# Patient Record
Sex: Male | Born: 1965 | Race: White | Hispanic: No | Marital: Married | Smoking: Current every day smoker
Health system: Southern US, Community
[De-identification: ages and names within clinical notes are randomized; demographics above are authoritative.]

## PROBLEM LIST (undated history)

## (undated) DIAGNOSIS — E119 Type 2 diabetes mellitus without complications: Secondary | ICD-10-CM

## (undated) DIAGNOSIS — I10 Essential (primary) hypertension: Secondary | ICD-10-CM

## (undated) DIAGNOSIS — E785 Hyperlipidemia, unspecified: Secondary | ICD-10-CM

## (undated) HISTORY — PX: CHOLECYSTECTOMY: SHX55

## (undated) HISTORY — PX: HAND SURGERY: SHX662

## (undated) HISTORY — PX: TONSILLECTOMY: SUR1361

---

## 2003-07-05 ENCOUNTER — Encounter: Payer: Self-pay | Admitting: Emergency Medicine

## 2003-07-05 ENCOUNTER — Emergency Department (HOSPITAL_COMMUNITY): Admission: EM | Admit: 2003-07-05 | Discharge: 2003-07-05 | Payer: Self-pay | Admitting: Emergency Medicine

## 2014-04-23 DIAGNOSIS — G4733 Obstructive sleep apnea (adult) (pediatric): Secondary | ICD-10-CM | POA: Insufficient documentation

## 2014-08-08 ENCOUNTER — Encounter (HOSPITAL_COMMUNITY): Payer: Self-pay | Admitting: Emergency Medicine

## 2014-08-08 ENCOUNTER — Emergency Department (HOSPITAL_COMMUNITY)
Admission: EM | Admit: 2014-08-08 | Discharge: 2014-08-09 | Disposition: A | Discharge status: Home / Self Care | Payer: BC Managed Care – PPO | Attending: Emergency Medicine | Admitting: Emergency Medicine

## 2014-08-08 ENCOUNTER — Emergency Department (HOSPITAL_COMMUNITY): Payer: BC Managed Care – PPO

## 2014-08-08 DIAGNOSIS — F17200 Nicotine dependence, unspecified, uncomplicated: Secondary | ICD-10-CM | POA: Insufficient documentation

## 2014-08-08 DIAGNOSIS — R197 Diarrhea, unspecified: Secondary | ICD-10-CM | POA: Diagnosis not present

## 2014-08-08 DIAGNOSIS — Z79899 Other long term (current) drug therapy: Secondary | ICD-10-CM | POA: Insufficient documentation

## 2014-08-08 DIAGNOSIS — E119 Type 2 diabetes mellitus without complications: Secondary | ICD-10-CM | POA: Insufficient documentation

## 2014-08-08 DIAGNOSIS — R109 Unspecified abdominal pain: Secondary | ICD-10-CM

## 2014-08-08 DIAGNOSIS — R1032 Left lower quadrant pain: Secondary | ICD-10-CM | POA: Diagnosis present

## 2014-08-08 HISTORY — DX: Type 2 diabetes mellitus without complications: E11.9

## 2014-08-08 LAB — URINALYSIS, ROUTINE W REFLEX MICROSCOPIC
GLUCOSE, UA: NEGATIVE mg/dL
HGB URINE DIPSTICK: NEGATIVE
KETONES UR: NEGATIVE mg/dL
Leukocytes, UA: NEGATIVE
Nitrite: NEGATIVE
PH: 5.5 (ref 5.0–8.0)
Protein, ur: 30 mg/dL — AB
SPECIFIC GRAVITY, URINE: 1.029 (ref 1.005–1.030)
Urobilinogen, UA: 0.2 mg/dL (ref 0.0–1.0)

## 2014-08-08 LAB — CBC WITH DIFFERENTIAL/PLATELET
BASOS ABS: 0 10*3/uL (ref 0.0–0.1)
BASOS PCT: 0 % (ref 0–1)
EOS ABS: 0.5 10*3/uL (ref 0.0–0.7)
Eosinophils Relative: 3 % (ref 0–5)
HEMATOCRIT: 49 % (ref 39.0–52.0)
HEMOGLOBIN: 18 g/dL — AB (ref 13.0–17.0)
Lymphocytes Relative: 15 % (ref 12–46)
Lymphs Abs: 2.5 10*3/uL (ref 0.7–4.0)
MCH: 31.8 pg (ref 26.0–34.0)
MCHC: 36.7 g/dL — ABNORMAL HIGH (ref 30.0–36.0)
MCV: 86.6 fL (ref 78.0–100.0)
MONO ABS: 0.8 10*3/uL (ref 0.1–1.0)
MONOS PCT: 5 % (ref 3–12)
NEUTROS ABS: 12.4 10*3/uL — AB (ref 1.7–7.7)
Neutrophils Relative %: 77 % (ref 43–77)
PLATELETS: 235 10*3/uL (ref 150–400)
RBC: 5.66 MIL/uL (ref 4.22–5.81)
RDW: 12.3 % (ref 11.5–15.5)
WBC: 16.2 10*3/uL — ABNORMAL HIGH (ref 4.0–10.5)

## 2014-08-08 LAB — URINE MICROSCOPIC-ADD ON

## 2014-08-08 LAB — COMPREHENSIVE METABOLIC PANEL
ALBUMIN: 4.7 g/dL (ref 3.5–5.2)
ALK PHOS: 80 U/L (ref 39–117)
ALT: 69 U/L — AB (ref 0–53)
AST: 23 U/L (ref 0–37)
Anion gap: 18 — ABNORMAL HIGH (ref 5–15)
BUN: 14 mg/dL (ref 6–23)
CHLORIDE: 98 meq/L (ref 96–112)
CO2: 20 meq/L (ref 19–32)
Calcium: 9.9 mg/dL (ref 8.4–10.5)
Creatinine, Ser: 0.83 mg/dL (ref 0.50–1.35)
GFR calc Af Amer: >90 mL/min (ref >90)
GFR calc non Af Amer: >90 mL/min (ref >90)
Glucose, Bld: 186 mg/dL — ABNORMAL HIGH (ref 70–99)
Potassium: 4 mEq/L (ref 3.7–5.3)
SODIUM: 136 meq/L — AB (ref 137–147)
Total Bilirubin: 0.5 mg/dL (ref 0.3–1.2)
Total Protein: 8 g/dL (ref 6.0–8.3)

## 2014-08-08 LAB — LIPASE, BLOOD: Lipase: 28 U/L (ref 11–59)

## 2014-08-08 MED ORDER — ONDANSETRON HCL 4 MG/2ML IJ SOLN
4.0000 mg | Freq: Once | INTRAMUSCULAR | Status: AC
  Administered 2014-08-08: 4 mg via INTRAVENOUS
  Filled 2014-08-08: qty 2

## 2014-08-08 MED ORDER — HYDROMORPHONE HCL PF 1 MG/ML IJ SOLN
0.5000 mg | INTRAMUSCULAR | Status: DC | PRN
  Administered 2014-08-08 – 2014-08-09 (×2): 0.5 mg via INTRAVENOUS
  Filled 2014-08-08 (×2): qty 1

## 2014-08-08 MED ORDER — SODIUM CHLORIDE 0.9 % IV SOLN
1000.0000 mL | INTRAVENOUS | Status: DC
  Administered 2014-08-08: 1000 mL via INTRAVENOUS

## 2014-08-08 MED ORDER — SODIUM CHLORIDE 0.9 % IV SOLN
1000.0000 mL | Freq: Once | INTRAVENOUS | Status: AC
  Administered 2014-08-08: 1000 mL via INTRAVENOUS

## 2014-08-08 NOTE — ED Notes (Signed)
Pt reports having centralized abdominal pain that started at 0900 this morning. Pt reports nausea and diarrhea, however denies emesis. Pt also reports having an anterior headache that started at approximately 2130. Pt denies light sensitivity.

## 2014-08-08 NOTE — ED Provider Notes (Signed)
CSN: [id number]     Arrival date & time 08/08/14  2217 History   First MD Initiated Contact with Patient 08/08/14 2236     Chief Complaint  Patient presents with  . Abdominal Pain    Patient is a 48 y.o. male presenting with abdominal pain and headaches. The history is provided by the patient.  Abdominal Pain Pain location:  Suprapubic Pain quality: aching   Pain radiates to:  Does not radiate Pain severity:  Moderate Onset quality:  Gradual Duration:  13 hours Timing:  Constant Progression:  Worsening Context: not recent illness, not recent travel and not suspicious food intake   Ineffective treatments: otc meds, imodium, pepto bismol. Associated symptoms: diarrhea, nausea and vomiting   Associated symptoms: no constipation, no cough, no dysuria and no fever   Headache Associated symptoms: abdominal pain, diarrhea, nausea and vomiting   Associated symptoms: no cough and no fever     Past Medical History  Diagnosis Date  . Diabetes mellitus without complication    Past Surgical History  Procedure Laterality Date  . Cholecystectomy    . Tonsillectomy    . Hand surgery     No family history on file. History  Substance Use Topics  . Smoking status: Current Every Day Smoker -- 1.50 packs/day for 20 years    Types: Cigarettes  . Smokeless tobacco: Never Used  . Alcohol Use: Yes     Comment: Socially     Review of Systems  Constitutional: Negative for fever.  Respiratory: Negative for cough.   Gastrointestinal: Positive for nausea, vomiting, abdominal pain and diarrhea. Negative for constipation.  Genitourinary: Negative for dysuria.  Neurological: Positive for headaches.  All other systems reviewed and are negative.     Allergies  Review of patient's allergies indicates no known allergies.  Home Medications   Prior to Admission medications   Medication Sig Start Date End Date Taking? Authorizing Provider  atorvastatin (LIPITOR) 20 MG tablet Take 20 mg by  mouth daily.   Yes Historical Provider, MD  cetirizine (ZYRTEC) 10 MG tablet Take 10 mg by mouth daily.   Yes Historical Provider, MD  gabapentin (NEURONTIN) 300 MG capsule Take 600 mg by mouth at bedtime.   Yes Historical Provider, MD  lisinopril (PRINIVIL,ZESTRIL) 2.5 MG tablet Take 2.5 mg by mouth daily.   Yes Historical Provider, MD  metFORMIN (GLUCOPHAGE) 500 MG tablet Take 500 mg by mouth 2 (two) times daily with a meal.   Yes Historical Provider, MD  Multiple Vitamin (MULTIVITAMIN WITH MINERALS) TABS tablet Take 1 tablet by mouth daily.   Yes Historical Provider, MD  tadalafil (CIALIS) 5 MG tablet Take 5 mg by mouth daily as needed for erectile dysfunction.   Yes Historical Provider, MD   BP 149/88  Pulse 103  Temp(Src) 98 F (36.7 C) (Oral)  Resp 18  SpO2 91% Physical Exam  Nursing note and vitals reviewed. Constitutional: He appears well-developed and well-nourished. No distress.  HENT:  Head: Normocephalic and atraumatic.  Right Ear: External ear normal.  Left Ear: External ear normal.  Eyes: Conjunctivae are normal. Right eye exhibits no discharge. Left eye exhibits no discharge. No scleral icterus.  Neck: Neck supple. No tracheal deviation present.  Cardiovascular: Normal rate, regular rhythm and intact distal pulses.   Pulmonary/Chest: Effort normal and breath sounds normal. No stridor. No respiratory distress. He has no wheezes. He has no rales.  Abdominal: Soft. Bowel sounds are normal. He exhibits no distension. There is tenderness in the  left lower quadrant. There is no rigidity, no rebound and no guarding. No hernia.  Musculoskeletal: He exhibits no edema and no tenderness.  Neurological: He is alert. He has normal strength. No cranial nerve deficit (no facial droop, extraocular movements intact, no slurred speech) or sensory deficit. He exhibits normal muscle tone. He displays no seizure activity. Coordination normal.  Skin: Skin is warm and dry. No rash noted.   Psychiatric: He has a normal mood and affect.    ED Course  Procedures (including critical care time) Labs Review Labs Reviewed  CBC WITH DIFFERENTIAL - Abnormal; Notable for the following:    WBC 16.2 (*)    Hemoglobin 18.0 (*)    MCHC 36.7 (*)    Neutro Abs 12.4 (*)    All other components within normal limits  URINALYSIS, ROUTINE W REFLEX MICROSCOPIC - Abnormal; Notable for the following:    Color, Urine AMBER (*)    Bilirubin Urine SMALL (*)    Protein, ur 30 (*)    All other components within normal limits  URINE MICROSCOPIC-ADD ON - Abnormal; Notable for the following:    Casts GRANULAR CAST (*)    All other components within normal limits  COMPREHENSIVE METABOLIC PANEL  LIPASE, BLOOD      MDM   Exam is concerning for diverticulitis.  Will CT abdomen to evaluate further.  Dr Dina Rich will follow up on results.  Dorie Rank, MD 08/08/14 [phone]

## 2014-08-09 MED ORDER — IOHEXOL 300 MG/ML  SOLN
50.0000 mL | Freq: Once | INTRAMUSCULAR | Status: AC | PRN
  Administered 2014-08-09: 50 mL via ORAL

## 2014-08-09 MED ORDER — HYDROCODONE-ACETAMINOPHEN 5-325 MG PO TABS
1.0000 | ORAL_TABLET | Freq: Once | ORAL | Status: AC
  Administered 2014-08-09: 1 via ORAL
  Filled 2014-08-09: qty 1

## 2014-08-09 MED ORDER — HYDROCODONE-ACETAMINOPHEN 5-325 MG PO TABS: 1.0000 | ORAL_TABLET | Freq: Four times a day (QID) | ORAL | Status: DC | PRN

## 2014-08-09 MED ORDER — ONDANSETRON HCL 4 MG PO TABS: 4.0000 mg | ORAL_TABLET | Freq: Three times a day (TID) | ORAL | Status: DC | PRN

## 2014-08-09 MED ORDER — IOHEXOL 300 MG/ML  SOLN
100.0000 mL | Freq: Once | INTRAMUSCULAR | Status: AC | PRN
  Administered 2014-08-09: 100 mL via INTRAVENOUS

## 2014-08-09 MED ORDER — LOPERAMIDE HCL 2 MG PO CAPS: 2.0000 mg | ORAL_CAPSULE | Freq: Four times a day (QID) | ORAL | Status: DC | PRN

## 2014-08-09 NOTE — ED Notes (Signed)
Patient was given gingerale and graham crackers. Patient not having any problems with nausea at this time.

## 2014-08-09 NOTE — Discharge Instructions (Signed)
You were evaluated today for abdominal pain and diarrhea. Your CT scan is largely reassuring. Given your workup, suspect a viral gastroenteritis. He need continued supportive care at home. If your symptoms worsen, you develop fevers, or are not able to tolerate fluids he should be reevaluated.  Viral Gastroenteritis Viral gastroenteritis is also known as stomach flu. This condition affects the stomach and intestinal tract. It can cause sudden diarrhea and vomiting. The illness typically lasts 3 to 8 days. Most people develop an immune response that eventually gets rid of the virus. While this natural response develops, the virus can make you quite ill. CAUSES  Many different viruses can cause gastroenteritis, such as rotavirus or noroviruses. You can catch one of these viruses by consuming contaminated food or water. You may also catch a virus by sharing utensils or other personal items with an infected person or by touching a contaminated surface. SYMPTOMS  The most common symptoms are diarrhea and vomiting. These problems can cause a severe loss of body fluids (dehydration) and a body salt (electrolyte) imbalance. Other symptoms may include:  Fever.  Headache.  Fatigue.  Abdominal pain. DIAGNOSIS  Your caregiver can usually diagnose viral gastroenteritis based on your symptoms and a physical exam. A stool sample may also be taken to test for the presence of viruses or other infections. TREATMENT  This illness typically goes away on its own. Treatments are aimed at rehydration. The most serious cases of viral gastroenteritis involve vomiting so severely that you are not able to keep fluids down. In these cases, fluids must be given through an intravenous line (IV). HOME CARE INSTRUCTIONS   Drink enough fluids to keep your urine clear or pale yellow. Drink small amounts of fluids frequently and increase the amounts as tolerated.  Ask your caregiver for specific rehydration  instructions.  Avoid:  Foods high in sugar.  Alcohol.  Carbonated drinks.  Tobacco.  Juice.  Caffeine drinks.  Extremely hot or cold fluids.  Fatty, greasy foods.  Too much intake of anything at one time.  Dairy products until 24 to 48 hours after diarrhea stops.  You may consume probiotics. Probiotics are active cultures of beneficial bacteria. They may lessen the amount and number of diarrheal stools in adults. Probiotics can be found in yogurt with active cultures and in supplements.  Wash your hands well to avoid spreading the virus.  Only take over-the-counter or prescription medicines for pain, discomfort, or fever as directed by your caregiver. Do not give aspirin to children. Antidiarrheal medicines are not recommended.  Ask your caregiver if you should continue to take your regular prescribed and over-the-counter medicines.  Keep all follow-up appointments as directed by your caregiver. SEEK IMMEDIATE MEDICAL CARE IF:   You are unable to keep fluids down.  You do not urinate at least once every 6 to 8 hours.  You develop shortness of breath.  You notice blood in your stool or vomit. This may look like coffee grounds.  You have abdominal pain that increases or is concentrated in one small area (localized).  You have persistent vomiting or diarrhea.  You have a fever.  The patient is a child younger than 3 months, and he or she has a fever.  The patient is a child older than 3 months, and he or she has a fever and persistent symptoms.  The patient is a child older than 3 months, and he or she has a fever and symptoms suddenly get worse.  The patient is  a baby, and he or she has no tears when crying. MAKE SURE YOU:   Understand these instructions.  Will watch your condition.  Will get help right away if you are not doing well or get worse. Document Released: 11/13/2005 Document Revised: 02/05/2012 Document Reviewed: 08/30/2011 [facility] Patient  Information 2015 [place], [place]. This information is not intended to replace advice given to you by your health care provider. Make sure you discuss any questions you have with your health care provider.

## 2014-08-09 NOTE — ED Provider Notes (Signed)
SIgned out by Dr. Tomi Bamberger.  Pending CT.  CT without evidence of diverticulitis. Patient does have a distended stomach without outlet obstruction. On repeat exam, patient reports some improvement. Will transition to by mouth pain medication. Has been tolerating liquids but states his abdominal cramping gets worse with oral intake.  Given diarrhea, suspect possible gastroenteritis. Discussed with patient gentle rehydration and slow advancement of diet. He will be discharged with a short course of pain medications and is to continue Imodium.  After history, exam, and medical workup I feel the patient has been appropriately medically screened and is safe for discharge home. Pertinent diagnoses were discussed with the patient. Patient was given return precautions.   Results for orders placed during the hospital encounter of 08/08/14  CBC WITH DIFFERENTIAL      Result Value Ref Range   WBC 16.2 (*) 4.0 - 10.5 K/uL   RBC 5.66  4.22 - 5.81 MIL/uL   Hemoglobin 18.0 (*) 13.0 - 17.0 g/dL   HCT 49.0  39.0 - 52.0 %   MCV 86.6  78.0 - 100.0 fL   MCH 31.8  26.0 - 34.0 pg   MCHC 36.7 (*) 30.0 - 36.0 g/dL   RDW 12.3  11.5 - 15.5 %   Platelets 235  150 - 400 K/uL   Neutrophils Relative % 77  43 - 77 %   Neutro Abs 12.4 (*) 1.7 - 7.7 K/uL   Lymphocytes Relative 15  12 - 46 %   Lymphs Abs 2.5  0.7 - 4.0 K/uL   Monocytes Relative 5  3 - 12 %   Monocytes Absolute 0.8  0.1 - 1.0 K/uL   Eosinophils Relative 3  0 - 5 %   Eosinophils Absolute 0.5  0.0 - 0.7 K/uL   Basophils Relative 0  0 - 1 %   Basophils Absolute 0.0  0.0 - 0.1 K/uL  COMPREHENSIVE METABOLIC PANEL      Result Value Ref Range   Sodium 136 (*) 137 - 147 mEq/L   Potassium 4.0  3.7 - 5.3 mEq/L   Chloride 98  96 - 112 mEq/L   CO2 20  19 - 32 mEq/L   Glucose, Bld 186 (*) 70 - 99 mg/dL   BUN 14  6 - 23 mg/dL   Creatinine, Ser 0.83  0.50 - 1.35 mg/dL   Calcium 9.9  8.4 - 10.5 mg/dL   Total Protein 8.0  6.0 - 8.3 g/dL   Albumin 4.7  3.5 - 5.2  g/dL   AST 23  0 - 37 U/L   ALT 69 (*) 0 - 53 U/L   Alkaline Phosphatase 80  39 - 117 U/L   Total Bilirubin 0.5  0.3 - 1.2 mg/dL   GFR calc non Af Amer >90  >90 mL/min   GFR calc Af Amer >90  >90 mL/min   Anion gap 18 (*) 5 - 15  URINALYSIS, ROUTINE W REFLEX MICROSCOPIC      Result Value Ref Range   Color, Urine AMBER (*) YELLOW   APPearance CLEAR  CLEAR   Specific Gravity, Urine 1.029  1.005 - 1.030   pH 5.5  5.0 - 8.0   Glucose, UA NEGATIVE  NEGATIVE mg/dL   Hgb urine dipstick NEGATIVE  NEGATIVE   Bilirubin Urine SMALL (*) NEGATIVE   Ketones, ur NEGATIVE  NEGATIVE mg/dL   Protein, ur 30 (*) NEGATIVE mg/dL   Urobilinogen, UA 0.2  0.0 - 1.0 mg/dL   Nitrite NEGATIVE  NEGATIVE  Leukocytes, UA NEGATIVE  NEGATIVE  LIPASE, BLOOD      Result Value Ref Range   Lipase 28  11 - 59 U/L  URINE MICROSCOPIC-ADD ON      Result Value Ref Range   Squamous Epithelial / LPF RARE  RARE   WBC, UA 0-2  <3 WBC/hpf   RBC / HPF 0-2  <3 RBC/hpf   Bacteria, UA RARE  RARE   Casts GRANULAR CAST (*) NEGATIVE   Urine-Other MUCOUS PRESENT     Ct Abdomen Pelvis W Contrast  08/09/2014   CLINICAL DATA:  Suprapubic pain, nausea, vomiting and diarrhea.  EXAM: CT ABDOMEN AND PELVIS WITH CONTRAST  TECHNIQUE: Multidetector CT imaging of the abdomen and pelvis was performed using the standard protocol following bolus administration of intravenous contrast.  CONTRAST:  26mL OMNIPAQUE IOHEXOL 300 MG/ML SOLN, 112mL OMNIPAQUE IOHEXOL 300 MG/ML SOLN  COMPARISON:  None.  FINDINGS: The lung bases are clear except for dependent subpleural atelectasis. The heart is normal in size. No pericardial effusion. The distal esophagus is grossly normal.  CT abdomen cul  The liver is unremarkable except for mild fatty infiltration. No focal hepatic lesions or intrahepatic biliary dilatation. The gallbladder is surgically absent. Mild associated common bile duct dilatation. The pancreas is normal. The spleen is normal. The adrenal glands  and kidneys are normal.  The stomach is quite distended with contrast, fluid and food. The duodenum is unremarkable. Findings could be due to gastroparesis or bladder outlet obstruction although I do not see any evidence of such. The small bowel and colon are unremarkable. No inflammatory changes, mass lesions or obstructive findings. There is moderate diverticulosis of the sigmoid colon without findings for acute diverticulitis. The terminal ileum is normal. The appendix is not identified.  The aorta is normal in caliber. Minimal scattered atherosclerotic calcifications. No mesenteric or retroperitoneal mass or adenopathy. Small scattered lymph nodes are noted.  CT pelvis:  The bladder, prostate gland and seminal vesicles are unremarkable. No pelvic mass or adenopathy. No free pelvic fluid collections. No inguinal mass or adenopathy.  No significant bony findings.  IMPRESSION: 1. Markedly distended stomach without findings for gastric outlet obstruction. 2. Mild diffuse fatty infiltration of the liver. 3. No other significant abdominal/pelvic findings.   Electronically Signed   By: Kalman Jewels M.D.   On: 08/09/2014 01:20      Merryl Hacker, MD 08/09/14 [phone]

## 2015-05-26 DIAGNOSIS — K219 Gastro-esophageal reflux disease without esophagitis: Secondary | ICD-10-CM | POA: Insufficient documentation

## 2015-05-26 DIAGNOSIS — E785 Hyperlipidemia, unspecified: Secondary | ICD-10-CM | POA: Insufficient documentation

## 2015-05-26 DIAGNOSIS — I10 Essential (primary) hypertension: Secondary | ICD-10-CM | POA: Diagnosis present

## 2015-05-26 DIAGNOSIS — N521 Erectile dysfunction due to diseases classified elsewhere: Secondary | ICD-10-CM | POA: Insufficient documentation

## 2015-07-27 ENCOUNTER — Encounter: Payer: Self-pay | Admitting: *Deleted

## 2016-10-13 ENCOUNTER — Emergency Department (HOSPITAL_COMMUNITY)
Admission: EM | Admit: 2016-10-13 | Discharge: 2016-10-13 | Disposition: A | Discharge status: Home / Self Care | Payer: 59 | Attending: Physician Assistant | Admitting: Physician Assistant

## 2016-10-13 ENCOUNTER — Emergency Department (HOSPITAL_COMMUNITY): Payer: 59

## 2016-10-13 ENCOUNTER — Encounter (HOSPITAL_COMMUNITY): Payer: Self-pay | Admitting: Emergency Medicine

## 2016-10-13 DIAGNOSIS — Z79899 Other long term (current) drug therapy: Secondary | ICD-10-CM | POA: Diagnosis not present

## 2016-10-13 DIAGNOSIS — M62830 Muscle spasm of back: Secondary | ICD-10-CM | POA: Insufficient documentation

## 2016-10-13 DIAGNOSIS — W19XXXA Unspecified fall, initial encounter: Secondary | ICD-10-CM | POA: Diagnosis not present

## 2016-10-13 DIAGNOSIS — E119 Type 2 diabetes mellitus without complications: Secondary | ICD-10-CM | POA: Diagnosis not present

## 2016-10-13 DIAGNOSIS — F17210 Nicotine dependence, cigarettes, uncomplicated: Secondary | ICD-10-CM | POA: Diagnosis not present

## 2016-10-13 DIAGNOSIS — Y929 Unspecified place or not applicable: Secondary | ICD-10-CM | POA: Insufficient documentation

## 2016-10-13 DIAGNOSIS — Y939 Activity, unspecified: Secondary | ICD-10-CM | POA: Diagnosis not present

## 2016-10-13 DIAGNOSIS — Y999 Unspecified external cause status: Secondary | ICD-10-CM | POA: Diagnosis not present

## 2016-10-13 DIAGNOSIS — R0781 Pleurodynia: Secondary | ICD-10-CM | POA: Diagnosis present

## 2016-10-13 MED ORDER — OXYCODONE-ACETAMINOPHEN 5-325 MG PO TABS: 1.0000 | ORAL_TABLET | ORAL | 0 refills | Status: AC | PRN

## 2016-10-13 MED ORDER — TRIAMCINOLONE ACETONIDE 10 MG/ML IJ SUSP
10.0000 mg | Freq: Once | INTRAMUSCULAR | Status: DC
  Filled 2016-10-13: qty 1

## 2016-10-13 MED ORDER — BUPIVACAINE HCL 0.5 % IJ SOLN
50.0000 mL | Freq: Once | INTRAMUSCULAR | Status: AC
  Administered 2016-10-13: 50 mL
  Filled 2016-10-13: qty 50

## 2016-10-13 MED ORDER — MORPHINE SULFATE (PF) 4 MG/ML IV SOLN
4.0000 mg | Freq: Once | INTRAVENOUS | Status: DC
  Filled 2016-10-13: qty 1

## 2016-10-13 MED ORDER — METHOCARBAMOL 500 MG PO TABS
1000.0000 mg | ORAL_TABLET | Freq: Once | ORAL | Status: AC
  Administered 2016-10-13: 1000 mg via ORAL
  Filled 2016-10-13: qty 2

## 2016-10-13 MED ORDER — HYDROMORPHONE HCL 1 MG/ML IJ SOLN
0.5000 mg | Freq: Once | INTRAMUSCULAR | Status: AC
  Administered 2016-10-13: 0.5 mg via INTRAMUSCULAR
  Filled 2016-10-13: qty 1

## 2016-10-13 MED ORDER — MORPHINE SULFATE (PF) 4 MG/ML IV SOLN
4.0000 mg | Freq: Once | INTRAVENOUS | Status: AC
  Administered 2016-10-13: 4 mg via INTRAMUSCULAR
  Filled 2016-10-13: qty 1

## 2016-10-13 MED ORDER — TRIAMCINOLONE ACETONIDE 40 MG/ML IJ SUSP
10.0000 mg | Freq: Once | INTRAMUSCULAR | Status: AC
  Administered 2016-10-13: 10 mg via INTRAMUSCULAR
  Filled 2016-10-13: qty 0.25

## 2016-10-13 MED ORDER — METHOCARBAMOL 500 MG PO TABS: ORAL_TABLET | ORAL | 0 refills | Status: AC

## 2016-10-13 NOTE — ED Provider Notes (Signed)
[facility] DEPT Provider Note   CSN: [id number] Arrival date & time: 10/13/16  1216     History   Chief Complaint Chief Complaint  Patient presents with  . Ribcage Pain    HPI  Blood pressure 164/99, pulse 88, temperature 97.8 F (36.6 C), temperature source Oral, resp. rate 18, SpO2 97 %.  Scott Harris is a 50 y.o. male acute severe left posterior lateral rib pain onset this morning when patient was stretching while laying in bed at about 5:50 AM, he states that he heard 2 pops in pain became severe afterwards. Been taking ibuprofen at home with little relief. Pain began in the affected area after he had a mechanical fall down 2 steps onto a parked automobile approximately 5 days ago. He was seen at primary care and had a negative x-ray at that time, he was given a prescription for Vicodin but states it didn't help him so he threw them away. He denies history of DVT/PE, recent immobilizations, cough, shortness of breath. Pain is exacerbated by palpation and comes in waves. Has been diagnosed with hypertension, takes his blood pressure medications regularly, states that the blood pressure has been well controlled when checked by primary care.Patient does smoke cigarettes.  Past Medical History:  Diagnosis Date  . Diabetes mellitus without complication ([place])     There are no active problems to display for this patient.   Past Surgical History:  Procedure Laterality Date  . CHOLECYSTECTOMY    . HAND SURGERY    . TONSILLECTOMY         Home Medications    Prior to Admission medications   Medication Sig Start Date End Date Taking? Authorizing Provider  atorvastatin (LIPITOR) 10 MG tablet Take 5 mg by mouth daily.   Yes Historical Provider, MD  FLUoxetine (PROZAC) 20 MG capsule Take 1 capsule by mouth every morning. 09/04/16  Yes Historical Provider, MD  gabapentin (NEURONTIN) 300 MG capsule Take 600 mg by mouth at bedtime.   Yes Historical Provider, MD  ibuprofen  (ADVIL,MOTRIN) 200 MG tablet Take 400 mg by mouth every 4 (four) hours as needed for moderate pain.   Yes Historical Provider, MD  lansoprazole (PREVACID) 30 MG capsule Take 1 capsule by mouth every morning. 06/19/16  Yes Historical Provider, MD  lisinopril (PRINIVIL,ZESTRIL) 2.5 MG tablet Take 2.5 mg by mouth daily.   Yes Historical Provider, MD  SitaGLIPtin-MetFORMIN HCl 50-1000 MG TB24 Take 1 tablet by mouth every morning. 06/19/16  Yes Historical Provider, MD  methocarbamol (ROBAXIN) 500 MG tablet Can take up to 1-2 tabs every 6 hours PRN PAIN 10/13/16   Elmyra Ricks Sheffield Hawker, PA-C  oxyCODONE-acetaminophen (PERCOCET) 5-325 MG tablet Take 1 tablet by mouth every 4 (four) hours as needed. 10/13/16   Elmyra Ricks Lachandra Dettmann, PA-C    Family History No family history on file.  Social History Social History  Substance Use Topics  . Smoking status: Current Every Day Smoker    Packs/day: 1.50    Years: 20.00    Types: Cigarettes  . Smokeless tobacco: Never Used  . Alcohol use Yes     Comment: Socially      Allergies   Patient has no known allergies.   Review of Systems Review of Systems  10 systems reviewed and found to be negative, except as noted in the HPI.   Physical Exam Updated Vital Signs BP 156/84   Pulse 87   Temp 97.8 F (36.6 C) (Oral)   Resp 17   SpO2 96%  Physical Exam  Constitutional: He appears well-developed and well-nourished.  HENT:  Head: Normocephalic.  Eyes: Conjunctivae are normal.  Neck: Normal range of motion.  Cardiovascular: Normal rate, regular rhythm and intact distal pulses.   Pulmonary/Chest: Effort normal.     He exhibits tenderness.  Exquisite point tenderness to palpation as diagrammed, no underlying crepitance, lung sounds clear to auscultation no midline thoracic or lumbar tenderness.  Abdominal: Soft. There is no tenderness.  Neurological: He is alert.     Psychiatric: He has a normal mood and affect.  Nursing note and vitals  reviewed.    ED Treatments / Results  Labs (all labs ordered are listed, but only abnormal results are displayed) Labs Reviewed - No data to display  EKG  EKG Interpretation None       Radiology Dg Ribs Unilateral W/chest Left  Result Date: 10/13/2016 CLINICAL DATA:  [place] down steps 8 days ago.  Left chest pain EXAM: LEFT RIBS AND CHEST - 3+ VIEW COMPARISON:  None. FINDINGS: No fracture or other bone lesions are seen involving the ribs. There is no evidence of pneumothorax or pleural effusion. Both lungs are clear. Heart size and mediastinal contours are within normal limits. IMPRESSION: Negative. Electronically Signed   By: Franchot Gallo M.D.   On: 10/13/2016 15:43    Procedures Procedures (including critical care time)  Procedure Note: Trigger Point Injection for Myofascial pain  Performed by Calleigh Lafontant, PA-C  Indication: muscle/myofascial pain Muscle body and tendon sheath of the left latissimus dorsi  muscle(s) were injected with 0.5% bupivacaine with kenalog under sterile technique for release of muscle spasm/pain. Patient tolerated well with immediate improvement of symptoms and no immediate complications following procedure.  CPT Code:   1 or 2 muscle bodies: 20552 3 or more: 20553    Medications Ordered in ED Medications  morphine 4 MG/ML injection 4 mg (4 mg Intramuscular Given 10/13/16 1313)  bupivacaine (MARCAINE) 0.5 % (with pres) injection 50 mL (50 mLs Infiltration Given 10/13/16 1401)  methocarbamol (ROBAXIN) tablet 1,000 mg (1,000 mg Oral Given 10/13/16 1401)  triamcinolone acetonide (KENALOG-40) injection 10 mg (10 mg Intramuscular Given 10/13/16 1414)  HYDROmorphone (DILAUDID) injection 0.5 mg (0.5 mg Intramuscular Given 10/13/16 1639)     Initial Impression / Assessment and Plan / ED Course  I have reviewed the triage vital signs and the nursing notes.  Pertinent labs & imaging results that were available during my care of the patient  were reviewed by me and considered in my medical decision making (see chart for details).  Clinical Course     Vitals:   10/13/16 1224 10/13/16 1407 10/13/16 1603  BP: 164/99 (!) 185/114 156/84  Pulse: 88 84 87  Resp: 18 19 17   Temp: 97.8 F (36.6 C)    TempSrc: Oral    SpO2: 97% 98% 96%    Medications  morphine 4 MG/ML injection 4 mg (4 mg Intramuscular Given 10/13/16 1313)  bupivacaine (MARCAINE) 0.5 % (with pres) injection 50 mL (50 mLs Infiltration Given 10/13/16 1401)  methocarbamol (ROBAXIN) tablet 1,000 mg (1,000 mg Oral Given 10/13/16 1401)  triamcinolone acetonide (KENALOG-40) injection 10 mg (10 mg Intramuscular Given 10/13/16 1414)  HYDROmorphone (DILAUDID) injection 0.5 mg (0.5 mg Intramuscular Given 10/13/16 1639)    Scott Harris is 50 y.o. male presenting with severe posterior thoracic pain, exacerbated by movement and palpation, he had a minor mechanical fall 7 days ago, he's had pain in this area since then however it increased significantly this morning  when stretching. Lung sounds clear, x-ray without rib fracture. Based on the severity of his pain, would consider alternative diagnosis however given the exacerbation by movement and palpation it seems to be musculoskeletal, I doubt this is a dissection/PE/kidney stone.  This is a shared visit with the attending physician who personally evaluated the patient and agrees with the care plan.   Evaluation does not show pathology that would require ongoing emergent intervention or inpatient treatment. Pt is hemodynamically stable and mentating appropriately. Discussed findings and plan with patient/guardian, who agrees with care plan. All questions answered. Return precautions discussed and outpatient follow up given.      Final Clinical Impressions(s) / ED Diagnoses   Final diagnoses:  Muscle spasm of back    New Prescriptions New Prescriptions   METHOCARBAMOL (ROBAXIN) 500 MG TABLET    Can take up to 1-2 tabs every  6 hours PRN PAIN   OXYCODONE-ACETAMINOPHEN (PERCOCET) 5-325 MG TABLET    Take 1 tablet by mouth every 4 (four) hours as needed.     Monico Blitz, PA-C 10/13/16 [place], MD 10/13/16 2225

## 2016-10-13 NOTE — ED Triage Notes (Signed)
Pt reports fall last Thursday; DG and cleared but reports "stretched and heard two pops" this morning at 0550. Severe pain to left ribcage area since.

## 2016-10-13 NOTE — ED Notes (Signed)
Bed: WTR8 Expected date:  Expected time:  Means of arrival:  Comments: 

## 2016-10-13 NOTE — Discharge Instructions (Signed)
Please take ibuprofen 400mg  (this is normally 2 over the counter pills) every 6 hours (take with food to minimze stomach irritation).   Take robaxin and/or percocet for breakthrough pain, do not drink alcohol, drive, care for children or perfom other critical tasks while taking robaxin and/or percocet.  Please follow with your primary care doctor in the next 2 days for a check-up. They must obtain records for further management.   Do not hesitate to return to the Emergency Department for any new, worsening or concerning symptoms.

## 2016-10-16 DIAGNOSIS — E349 Endocrine disorder, unspecified: Secondary | ICD-10-CM | POA: Insufficient documentation

## 2016-10-20 ENCOUNTER — Emergency Department (HOSPITAL_COMMUNITY): Payer: 59

## 2016-10-20 ENCOUNTER — Emergency Department (EMERGENCY_DEPARTMENT_HOSPITAL)
Admission: EM | Admit: 2016-10-20 | Discharge: 2016-10-20 | Disposition: A | Discharge status: Home / Self Care | Payer: 59 | Source: Home / Self Care | Attending: Emergency Medicine | Admitting: Emergency Medicine

## 2016-10-20 ENCOUNTER — Encounter (HOSPITAL_COMMUNITY): Payer: Self-pay | Admitting: Emergency Medicine

## 2016-10-20 DIAGNOSIS — G939 Disorder of brain, unspecified: Secondary | ICD-10-CM

## 2016-10-20 DIAGNOSIS — F17210 Nicotine dependence, cigarettes, uncomplicated: Secondary | ICD-10-CM

## 2016-10-20 DIAGNOSIS — E119 Type 2 diabetes mellitus without complications: Secondary | ICD-10-CM

## 2016-10-20 DIAGNOSIS — I10 Essential (primary) hypertension: Secondary | ICD-10-CM

## 2016-10-20 DIAGNOSIS — C719 Malignant neoplasm of brain, unspecified: Secondary | ICD-10-CM | POA: Insufficient documentation

## 2016-10-20 DIAGNOSIS — Z7984 Long term (current) use of oral hypoglycemic drugs: Secondary | ICD-10-CM | POA: Insufficient documentation

## 2016-10-20 DIAGNOSIS — I63233 Cerebral infarction due to unspecified occlusion or stenosis of bilateral carotid arteries: Secondary | ICD-10-CM | POA: Diagnosis not present

## 2016-10-20 DIAGNOSIS — R410 Disorientation, unspecified: Secondary | ICD-10-CM

## 2016-10-20 DIAGNOSIS — R791 Abnormal coagulation profile: Secondary | ICD-10-CM

## 2016-10-20 DIAGNOSIS — Z79899 Other long term (current) drug therapy: Secondary | ICD-10-CM

## 2016-10-20 DIAGNOSIS — R4182 Altered mental status, unspecified: Secondary | ICD-10-CM | POA: Diagnosis not present

## 2016-10-20 DIAGNOSIS — D496 Neoplasm of unspecified behavior of brain: Secondary | ICD-10-CM

## 2016-10-20 HISTORY — DX: Hyperlipidemia, unspecified: E78.5

## 2016-10-20 HISTORY — DX: Essential (primary) hypertension: I10

## 2016-10-20 LAB — I-STAT CHEM 8, ED
BUN: 16 mg/dL (ref 6–20)
CHLORIDE: 101 mmol/L (ref 101–111)
Calcium, Ion: 1.22 mmol/L (ref 1.15–1.40)
Creatinine, Ser: 0.8 mg/dL (ref 0.61–1.24)
Glucose, Bld: 209 mg/dL — ABNORMAL HIGH (ref 65–99)
HCT: 48 % (ref 39.0–52.0)
HEMOGLOBIN: 16.3 g/dL (ref 13.0–17.0)
POTASSIUM: 4.6 mmol/L (ref 3.5–5.1)
SODIUM: 135 mmol/L (ref 135–145)
TCO2: 25 mmol/L (ref 0–100)

## 2016-10-20 LAB — RAPID URINE DRUG SCREEN, HOSP PERFORMED
Amphetamines: NOT DETECTED
BENZODIAZEPINES: NOT DETECTED
Barbiturates: NOT DETECTED
Cocaine: NOT DETECTED
OPIATES: NOT DETECTED
Tetrahydrocannabinol: NOT DETECTED

## 2016-10-20 LAB — COMPREHENSIVE METABOLIC PANEL
ALBUMIN: 4.9 g/dL (ref 3.5–5.0)
ALT: 97 U/L — ABNORMAL HIGH (ref 17–63)
ANION GAP: 10 (ref 5–15)
AST: 42 U/L — ABNORMAL HIGH (ref 15–41)
Alkaline Phosphatase: 78 U/L (ref 38–126)
BUN: 14 mg/dL (ref 6–20)
CHLORIDE: 101 mmol/L (ref 101–111)
CO2: 23 mmol/L (ref 22–32)
Calcium: 9.7 mg/dL (ref 8.9–10.3)
Creatinine, Ser: 0.81 mg/dL (ref 0.61–1.24)
GFR calc non Af Amer: >60 mL/min (ref >60)
GLUCOSE: 214 mg/dL — AB (ref 65–99)
POTASSIUM: 4.4 mmol/L (ref 3.5–5.1)
SODIUM: 134 mmol/L — AB (ref 135–145)
Total Bilirubin: 0.7 mg/dL (ref 0.3–1.2)
Total Protein: 8.1 g/dL (ref 6.5–8.1)

## 2016-10-20 LAB — DIFFERENTIAL
BASOS PCT: 0 %
Basophils Absolute: 0 10*3/uL (ref 0.0–0.1)
EOS ABS: 0.1 10*3/uL (ref 0.0–0.7)
EOS PCT: 1 %
LYMPHS PCT: 25 %
Lymphs Abs: 1.9 10*3/uL (ref 0.7–4.0)
Monocytes Absolute: 0.7 10*3/uL (ref 0.1–1.0)
Monocytes Relative: 10 %
NEUTROS PCT: 64 %
Neutro Abs: 5 10*3/uL (ref 1.7–7.7)

## 2016-10-20 LAB — URINALYSIS, ROUTINE W REFLEX MICROSCOPIC
Bilirubin Urine: NEGATIVE
Glucose, UA: 100 mg/dL — AB
HGB URINE DIPSTICK: NEGATIVE
Ketones, ur: NEGATIVE mg/dL
Leukocytes, UA: NEGATIVE
NITRITE: NEGATIVE
PH: 5.5 (ref 5.0–8.0)
Protein, ur: NEGATIVE mg/dL
SPECIFIC GRAVITY, URINE: 1.007 (ref 1.005–1.030)

## 2016-10-20 LAB — ETHANOL

## 2016-10-20 LAB — CBC
HCT: 45.6 % (ref 39.0–52.0)
Hemoglobin: 16.5 g/dL (ref 13.0–17.0)
MCH: 31.6 pg (ref 26.0–34.0)
MCHC: 36.2 g/dL — AB (ref 30.0–36.0)
MCV: 87.4 fL (ref 78.0–100.0)
PLATELETS: 264 10*3/uL (ref 150–400)
RBC: 5.22 MIL/uL (ref 4.22–5.81)
RDW: 12.4 % (ref 11.5–15.5)
WBC: 7.7 10*3/uL (ref 4.0–10.5)

## 2016-10-20 LAB — PROTIME-INR
INR: 0.89
PROTHROMBIN TIME: 12 s (ref 11.4–15.2)

## 2016-10-20 LAB — I-STAT TROPONIN, ED: Troponin i, poc: 0 ng/mL (ref 0.00–0.08)

## 2016-10-20 LAB — CBG MONITORING, ED: GLUCOSE-CAPILLARY: 185 mg/dL — AB (ref 65–99)

## 2016-10-20 LAB — APTT: aPTT: 26 seconds (ref 24–36)

## 2016-10-20 MED ORDER — PANTOPRAZOLE SODIUM 20 MG PO TBEC
20.0000 mg | DELAYED_RELEASE_TABLET | Freq: Once | ORAL | Status: AC
  Administered 2016-10-20: 20 mg via ORAL
  Filled 2016-10-20: qty 1

## 2016-10-20 MED ORDER — LEVETIRACETAM 500 MG PO TABS
500.0000 mg | ORAL_TABLET | Freq: Two times a day (BID) | ORAL | 0 refills | Status: AC
Start: 2016-10-20 — End: ?

## 2016-10-20 MED ORDER — GADOBENATE DIMEGLUMINE 529 MG/ML IV SOLN
20.0000 mL | Freq: Once | INTRAVENOUS | Status: AC | PRN
  Administered 2016-10-20: 19 mL via INTRAVENOUS

## 2016-10-20 MED ORDER — OMEPRAZOLE 20 MG PO CPDR: 20.0000 mg | DELAYED_RELEASE_CAPSULE | Freq: Every day | ORAL | 1 refills | Status: AC

## 2016-10-20 MED ORDER — DEXAMETHASONE 4 MG PO TABS
8.0000 mg | ORAL_TABLET | Freq: Once | ORAL | Status: AC
  Administered 2016-10-20: 8 mg via ORAL
  Filled 2016-10-20: qty 2

## 2016-10-20 MED ORDER — DEXAMETHASONE 4 MG PO TABS: 8.0000 mg | ORAL_TABLET | Freq: Two times a day (BID) | ORAL | 1 refills | Status: AC

## 2016-10-20 MED ORDER — LEVETIRACETAM 500 MG PO TABS
500.0000 mg | ORAL_TABLET | Freq: Once | ORAL | Status: AC
  Administered 2016-10-20: 500 mg via ORAL
  Filled 2016-10-20: qty 1

## 2016-10-20 NOTE — ED Provider Notes (Addendum)
Consult: [id number]) reviewed with Dr. Saintclair Halsted. He will assess the patient in the emergency department. Requests consultation to neurology.  Consult: (18:30(reviewed with Dr. Lawana Pai, neurology. He reports that at this time he cannot leave [facility] due to no additional coverage for code strokes. Requested the patient be transferred to [facility] for neurology consultation.  I have reviewed the patient's results with them and repeated examination. The patient is alert and oriented. He can perform finger-nose examination bilaterally. He can perform heel shin examination bilaterally. Extraocular motions are symmetric. He has no respiratory distress. I have reviewed the history present illness with the patient and his wife. They're updated on plans for consultation.   Charlesetta Shanks, MD 10/20/16 1834  Patient has been seen by both Dr. Saintclair Halsted and Dr. Leonel Ramsay. Follow-up arrangements have been made. Dr. Leonel Ramsay advises for prescriptions of Keppra 500 twice daily. Decadron 8 mg every 12 hours. And omeprazole.   Charlesetta Shanks, MD 10/20/16 2108

## 2016-10-20 NOTE — Consult Note (Signed)
Neurology Consultation Reason for Consult: Brain Lesion Referring Physician: Calla Kicks  CC: Confusion  History is obtained from:patient  HPI: Shotaro Lewy is a 50 y.o. male who presented due to confusion. His wife states that yesterday, he was quite confused, not talking very much and saying things that didn't quite make sense. Today, he is slightly better but still not his normal self. He also reports intermittent paresthesias predominantly of the left arm which are transient. He states that they will start distally and then worked her way up his arm last for approximately 1-2 minutes. He has not noticed any tensing of the arm or twitching with these. He has not noticed any confusion with these. He denies any frank seizure activity. He has noticed these for about the past 2 weeks. Over this time, he has also noticed increasing number of headaches.  An MRI was performed which shows a lesion in the right frontal lobe which is restricted diffusion. Neurosurgery was consulted and recommended neurology consultation as well.    ROS: A 14 point ROS was performed and is negative except as noted in the HPI.   Past Medical History:  Diagnosis Date  . Diabetes mellitus without complication ([place])   . Hyperlipemia   . Hypertension      Family history: No history of similar   Social History:  reports that he has been smoking Cigarettes.  He has a 30.00 pack-year smoking history. He has never used smokeless tobacco. He reports that he drinks alcohol. He reports that he does not use drugs.   Exam: Current vital signs: BP 166/99 (BP Location: Left Arm)   Pulse 82   Temp 97.6 F (36.4 C) (Oral)   Resp 17   Ht 5\' 9"  (1.753 m)   Wt 90.7 kg (200 lb)   SpO2 96%   BMI 29.53 kg/m  Vital signs in last 24 hours: Temp:  [97.6 F (36.4 C)-98.5 F (36.9 C)] 97.6 F (36.4 C) (11/24 2125) Pulse Rate:  [80-86] 82 (11/24 2125) Resp:  [12-23] 17 (11/24 2125) BP: (165-192)/(99-138) 166/99 (11/24  2125) SpO2:  [94 %-100 %] 96 % (11/24 2125) Weight:  [90.7 kg (200 lb)] 90.7 kg (200 lb) (11/24 1938)   Physical Exam  Constitutional: Appears well-developed and well-nourished.  Psych: Affect appropriate to situation Eyes: No scleral injection HENT: No OP obstrucion Head: Normocephalic.  Cardiovascular: Normal rate and regular rhythm.  Respiratory: Effort normal and breath sounds normal to anterior ascultation GI: Soft.  No distension. There is no tenderness.  Skin: WDI  Neuro: Mental Status: Patient is awake, alert, oriented to person, place, month, year, and situation. Patient is able to give a clear and coherent history. No signs of aphasia or neglect He does Take some time to respond to cert but does not appear aphasic. Cranial Nerves: II: Visual Fields are full. Pupils are equal, round, and reactive to light.   III,IV, VI: EOMI without ptosis or diploplia.  V: Facial sensation is symmetric to temperature VII: Facial movement is symmetric.  VIII: hearing is intact to voice X: Uvula elevates symmetrically XI: Shoulder shrug is symmetric. XII: tongue is midline without atrophy or fasciculations.  Motor: Tone is normal. Bulk is normal. 5/5 strength was present in all four extremities.  Sensory: Sensation is symmetric to light touch and temperature in the arms and legs. Cerebellar: FNF and HKS are intact bilaterally     I have reviewed labs in epic and the results pertinent to this consultation are: CBC, BMP  I have reviewed the images obtained:MRI brain-lesion in the right Frontal lobe exhibiting restricted diffusionWithout enhancement  Impression: 50 year old male with abnormal brain imaging in the setting of 2 weeks of transient paresthesias, headaches and confusion. I think that if this represented infarct, his symptoms would be much worse, and in any case he is now more than 2 weeks out from onset of symptoms. The imaging is certainly not typical for this.  Inflammatory demyelinating lesions would be expected to show enhancement if they were also showing restricted diffusion which makes me think that this is not demyelinating. He exhibits no signs of infection.   The transient paresthesias that he describes sound very concerning for seizure. We would not be able to get an EEG here at [facility] until Monday and I do not think that I would favor admission just for this test in this setting. Given the presence of the lesion and description of jacksonian marching paresthesia, I would favor starting keppra empirically.   He does have some edema and may benefit from steroids.   At this time, especially since his wife is reporting some improvement, I do not think there would be much value to hospitalization. Certainly if he worse to worsen, then this may be necessary.   Recommendations: 1) Decadron, could start 8mg  Q12H with plan to titrate down fairly rapidly.  2) Discussed importance of checking glucose and BP while on steroids, he has follow up with PCP on Monday.  3) I have requested follow up with neurology as outpatient, I would favor relatively short term follow up imaging and outpatient EEG.  4) keppra 500mg  BID 5) Patient is unable to drive, operate heavy machinery, perform activities at heights or participate in water activities until release by outpatient physician. This was discussed with the patient who expressed understanding.      Roland Rack, MD [facility] [phone]  If 7pm- 7am, please page neurology on call as listed in [place].

## 2016-10-20 NOTE — ED Notes (Signed)
Patient transported to CT 

## 2016-10-20 NOTE — ED Triage Notes (Addendum)
Pt here from evaluation of tongue numbness, left facial droop, tingling/numbness to hands/tips of fingers, and delay in responses. Pt strength strong throughout, denies other numbness, denies visual loss, a/o x4 but slow to respond. LSN 1200 yesterday; hx of hypertension, hyperlipidemia, and DM 2.

## 2016-10-20 NOTE — ED Notes (Signed)
Bed: [id number] Expected date:  Expected time:  Means of arrival:  Comments: RES B

## 2016-10-20 NOTE — ED Provider Notes (Signed)
[facility] DEPT Provider Note   CSN: [id number] Arrival date & time: 10/20/16  1306     History   Chief Complaint Chief Complaint  Patient presents with  . Stroke Symptoms    HPI Scott Harris is a 50 y.o. male.  He is here for evaluation of confusion, difficulty using his arms, and trouble walking, which started about noon yesterday. He is usually a fairly high functioning individual, working a job as a Geophysicist/field seismologist. He defers answers to questions, to his wife was with him. She states that he "drinks a lot of alcohol". 2 days ago he fell, but she is not sure if he injured himself at that time. His symptoms have been persistent since they started yesterday. His wife is able to bring him here by private vehicle. He is somewhat unsteady with walking, but he does not need to be supported, to walk. No prior similar problem. There are no other known modifying factors.  HPI  Past Medical History:  Diagnosis Date  . Diabetes mellitus without complication ([place])   . Hyperlipemia   . Hypertension     There are no active problems to display for this patient.   Past Surgical History:  Procedure Laterality Date  . CHOLECYSTECTOMY    . HAND SURGERY    . TONSILLECTOMY         Home Medications    Prior to Admission medications   Medication Sig Start Date End Date Taking? Authorizing Provider  atorvastatin (LIPITOR) 10 MG tablet Take 5 mg by mouth daily.    Historical Provider, MD  FLUoxetine (PROZAC) 20 MG capsule Take 1 capsule by mouth every morning. 09/04/16   Historical Provider, MD  gabapentin (NEURONTIN) 300 MG capsule Take 600 mg by mouth at bedtime.    Historical Provider, MD  ibuprofen (ADVIL,MOTRIN) 200 MG tablet Take 400 mg by mouth every 4 (four) hours as needed for moderate pain.    Historical Provider, MD  lansoprazole (PREVACID) 30 MG capsule Take 1 capsule by mouth every morning. 06/19/16   Historical Provider, MD  lisinopril (PRINIVIL,ZESTRIL) 2.5 MG tablet Take  2.5 mg by mouth daily.    Historical Provider, MD  methocarbamol (ROBAXIN) 500 MG tablet Can take up to 1-2 tabs every 6 hours PRN PAIN 10/13/16   Elmyra Ricks Pisciotta, PA-C  oxyCODONE-acetaminophen (PERCOCET) 5-325 MG tablet Take 1 tablet by mouth every 4 (four) hours as needed. 10/13/16   Nicole Pisciotta, PA-C  SitaGLIPtin-MetFORMIN HCl 50-1000 MG TB24 Take 1 tablet by mouth every morning. 06/19/16   Historical Provider, MD    Family History No family history on file.  Social History Social History  Substance Use Topics  . Smoking status: Current Every Day Smoker    Packs/day: 1.50    Years: 20.00    Types: Cigarettes  . Smokeless tobacco: Never Used  . Alcohol use Yes     Comment: Socially      Allergies   Patient has no known allergies.   Review of Systems Review of Systems  All other systems reviewed and are negative.    Physical Exam Updated Vital Signs BP (!) 173/121 (BP Location: Left Arm)   Pulse 86   Temp 97.6 F (36.4 C) (Oral)   Resp 18   SpO2 97%   Physical Exam  Constitutional: He is oriented to person, place, and time. He appears well-developed and well-nourished.  HENT:  Head: Normocephalic and atraumatic.  Right Ear: External ear normal.  Left Ear: External ear normal.  Eyes: Conjunctivae and EOM are normal. Pupils are equal, round, and reactive to light.  Neck: Normal range of motion and phonation normal. Neck supple.  Cardiovascular: Normal rate, regular rhythm and normal heart sounds.   Pulmonary/Chest: Effort normal and breath sounds normal. He exhibits no bony tenderness.  Abdominal: Soft. There is no tenderness.  Musculoskeletal: Normal range of motion.  Neurological: He is alert and oriented to person, place, and time. No cranial nerve deficit or sensory deficit. He exhibits normal muscle tone. Coordination normal.  No dysarthria and aphasia or nystagmus. No plantar drift. Normal strength in arms and legs bilaterally.  Skin: Skin is warm, dry  and intact.  Psychiatric: He has a normal mood and affect. His behavior is normal. Judgment and thought content normal.  Nursing note and vitals reviewed.    ED Treatments / Results  Labs (all labs ordered are listed, but only abnormal results are displayed) Labs Reviewed  PROTIME-INR  APTT  CBC  DIFFERENTIAL  COMPREHENSIVE METABOLIC PANEL  ETHANOL  PROTIME-INR  APTT  RAPID URINE DRUG SCREEN, HOSP PERFORMED  URINALYSIS, ROUTINE W REFLEX MICROSCOPIC (NOT AT [facility])  I-STAT TROPOININ, ED  I-STAT CHEM 8, ED  CBG MONITORING, ED    EKG  EKG Interpretation  Date/Time:  Friday October 20 2016 13:27:38 EST Ventricular Rate:  82 PR Interval:    QRS Duration: 101 QT Interval:  354 QTC Calculation: 414 R Axis:   36 Text Interpretation:  Sinus rhythm Atrial premature complexes No old tracing to compare Confirmed by [facility]  MD, Journiee Feldkamp [id number]) on 10/20/2016 1:31:24 PM       Radiology No results found.  Procedures Procedures (including critical care time)  Medications Ordered in ED Medications - No data to display   Initial Impression / Assessment and Plan / ED Course  I have reviewed the triage vital signs and the nursing notes.  Pertinent labs & imaging results that were available during my care of the patient were reviewed by me and considered in my medical decision making (see chart for details).  Clinical Course as of Oct 21 841  Fri Oct 20, 2016  1534 High Glucose: (!) 214 [EW]  1534 Nonspecific right frontal swelling, differential diagnosis includes stroke and tumor. Advanced imaging ordered. CT Head Wo Contrast [EW]  [id number] Findings discussed with patient and wife, the wife is upset with this news. They understand further testing is necessary.  [EW]    Clinical Course User Index [EW] Daleen Bo, MD    Medications  gadobenate dimeglumine (MULTIHANCE) injection 20 mL (19 mLs Intravenous Contrast Given 10/20/16 1622)  levETIRAcetam (KEPPRA) tablet 500 mg (500  mg Oral Given 10/20/16 2120)  dexamethasone (DECADRON) tablet 8 mg (8 mg Oral Given 10/20/16 2120)  pantoprazole (PROTONIX) EC tablet 20 mg (20 mg Oral Given 10/20/16 2139)    Patient Vitals for the past 24 hrs:  BP Temp Temp src Pulse Resp SpO2 Height Weight  10/20/16 2125 166/99 97.6 F (36.4 C) Oral 82 17 96 % - -  10/20/16 2100 (!) 179/123 - - - 16 - - -  10/20/16 2030 (!) 192/138 - - 80 17 96 % - -  10/20/16 2000 (!) 172/121 - - 85 16 97 % - -  10/20/16 1938 - - - - - - 5\' 9"  (1.753 m) 200 lb (90.7 kg)  10/20/16 1930 177/100 - - 84 23 94 % - -  10/20/16 1800 (!) 165/105 - - 84 13 95 % - -  10/20/16 1650 (!)  187/105 98.5 F (36.9 C) Oral 81 12 100 % - -  10/20/16 1430 (!) 173/107 - - 80 15 96 % - -  10/20/16 1329 - 97.6 F (36.4 C) - - - - - -  10/20/16 1318 (!) 173/121 97.6 F (36.4 C) Oral 86 18 97 % - -       Final Clinical Impressions(s) / ED Diagnoses   Final diagnoses:  Confusion  Brain neoplasm (HCC)    New symptoms, with finding of brain edema, requiring advanced imaging after CT imaging. Patient checked out to Dr. Vallery Ridge to evaluate after MRI imaging.   Nursing Notes Reviewed/ Care Coordinated, and agree without changes. Applicable Imaging Reviewed.  Interpretation of Laboratory Data incorporated into ED treatment  Plan- as per oncoming provider team  New Prescriptions New Prescriptions   No medications on file     Daleen Bo, MD 09/29/2016 [phone]

## 2016-10-20 NOTE — Discharge Instructions (Signed)
You'll need further evaluation to determine the exact nature of the tumor identified in your brain. Follow-up with Drs. Saintclair Halsted and [place] as instructed. Take prescribed medications.

## 2016-10-20 NOTE — ED Notes (Signed)
Patient transported to MRI 

## 2016-10-20 NOTE — Consult Note (Signed)
Reason for Consult: Right frontal brain lesion Referring Physician: Emergency department  Scott Harris is an 50 y.o. male.  HPI: Patient is very pleasant 50 year old gentleman who has had a couple weeks of episodic numbness in his fingers of both hands as well as his tongue but yesterday was noted by his family to have significant speech change difficulty forming words and making nonsensical sentences. This continued throughout the evening and they brought him to the ER today. She does report that the speech is significantly improved today but still not to his baseline. He denies any significant visual changes any weakness in his arms or his legs. Mild headache has had multiple falls 1 severe one about 3 weeks ago where he bruised his ribs and struck the right side of his head.  Past Medical History:  Diagnosis Date  . Diabetes mellitus without complication ([place])   . Hyperlipemia   . Hypertension     Past Surgical History:  Procedure Laterality Date  . CHOLECYSTECTOMY    . HAND SURGERY    . TONSILLECTOMY      No family history on file.  Social History:  reports that he has been smoking Cigarettes.  He has a 30.00 pack-year smoking history. He has never used smokeless tobacco. He reports that he drinks alcohol. He reports that he does not use drugs.  Allergies: No Known Allergies  Medications: I have reviewed the patient's current medications.  Results for orders placed or performed during the hospital encounter of 10/20/16 (from the past 48 hour(s))  Protime-INR     Status: None   Collection Time: 10/20/16  1:22 PM  Result Value Ref Range   Prothrombin Time 12.0 11.4 - 15.2 seconds   INR 0.89   APTT     Status: None   Collection Time: 10/20/16  1:22 PM  Result Value Ref Range   aPTT 26 24 - 36 seconds  CBC     Status: Abnormal   Collection Time: 10/20/16  1:22 PM  Result Value Ref Range   WBC 7.7 4.0 - 10.5 K/uL   RBC 5.22 4.22 - 5.81 MIL/uL   Hemoglobin 16.5 13.0 - 17.0 g/dL    HCT 45.6 39.0 - 52.0 %   MCV 87.4 78.0 - 100.0 fL   MCH 31.6 26.0 - 34.0 pg   MCHC 36.2 (H) 30.0 - 36.0 g/dL   RDW 12.4 11.5 - 15.5 %   Platelets 264 150 - 400 K/uL  Differential     Status: None   Collection Time: 10/20/16  1:22 PM  Result Value Ref Range   Neutrophils Relative % 64 %   Neutro Abs 5.0 1.7 - 7.7 K/uL   Lymphocytes Relative 25 %   Lymphs Abs 1.9 0.7 - 4.0 K/uL   Monocytes Relative 10 %   Monocytes Absolute 0.7 0.1 - 1.0 K/uL   Eosinophils Relative 1 %   Eosinophils Absolute 0.1 0.0 - 0.7 K/uL   Basophils Relative 0 %   Basophils Absolute 0.0 0.0 - 0.1 K/uL  Comprehensive metabolic panel     Status: Abnormal   Collection Time: 10/20/16  1:22 PM  Result Value Ref Range   Sodium 134 (L) 135 - 145 mmol/L   Potassium 4.4 3.5 - 5.1 mmol/L   Chloride 101 101 - 111 mmol/L   CO2 23 22 - 32 mmol/L   Glucose, Bld 214 (H) 65 - 99 mg/dL   BUN 14 6 - 20 mg/dL   Creatinine, Ser 0.81 0.61 -  1.24 mg/dL   Calcium 9.7 8.9 - 10.3 mg/dL   Total Protein 8.1 6.5 - 8.1 g/dL   Albumin 4.9 3.5 - 5.0 g/dL   AST 42 (H) 15 - 41 U/L   ALT 97 (H) 17 - 63 U/L   Alkaline Phosphatase 78 38 - 126 U/L   Total Bilirubin 0.7 0.3 - 1.2 mg/dL   GFR calc non Af Amer >60 >60 mL/min   GFR calc Af Amer >60 >60 mL/min    Comment: (NOTE) The eGFR has been calculated using the CKD EPI equation. This calculation has not been validated in all clinical situations. eGFR's persistently <60 mL/min signify possible Chronic Kidney Disease.    Anion gap 10 5 - 15  Ethanol     Status: None   Collection Time: 10/20/16  1:22 PM  Result Value Ref Range   Alcohol, Ethyl (B) <5 <5 mg/dL    Comment:        LOWEST DETECTABLE LIMIT FOR SERUM ALCOHOL IS 5 mg/dL FOR MEDICAL PURPOSES ONLY   I-stat troponin, ED     Status: None   Collection Time: 10/20/16  1:29 PM  Result Value Ref Range   Troponin i, poc 0.00 0.00 - 0.08 ng/mL   Comment 3            Comment: Due to the release kinetics of cTnI, a negative  result within the first hours of the onset of symptoms does not rule out myocardial infarction with certainty. If myocardial infarction is still suspected, repeat the test at appropriate intervals.   I-Stat Chem 8, ED     Status: Abnormal   Collection Time: 10/20/16  1:32 PM  Result Value Ref Range   Sodium 135 135 - 145 mmol/L   Potassium 4.6 3.5 - 5.1 mmol/L   Chloride 101 101 - 111 mmol/L   BUN 16 6 - 20 mg/dL   Creatinine, Ser 0.80 0.61 - 1.24 mg/dL   Glucose, Bld 209 (H) 65 - 99 mg/dL   Calcium, Ion 1.22 1.15 - 1.40 mmol/L   TCO2 25 0 - 100 mmol/L   Hemoglobin 16.3 13.0 - 17.0 g/dL   HCT 48.0 39.0 - 52.0 %  CBG monitoring, ED     Status: Abnormal   Collection Time: 10/20/16  1:36 PM  Result Value Ref Range   Glucose-Capillary 185 (H) 65 - 99 mg/dL  Urine rapid drug screen (hosp performed)not at [facility]     Status: None   Collection Time: 10/20/16  2:37 PM  Result Value Ref Range   Opiates NONE DETECTED NONE DETECTED   Cocaine NONE DETECTED NONE DETECTED   Benzodiazepines NONE DETECTED NONE DETECTED   Amphetamines NONE DETECTED NONE DETECTED   Tetrahydrocannabinol NONE DETECTED NONE DETECTED   Barbiturates NONE DETECTED NONE DETECTED    Comment:        DRUG SCREEN FOR MEDICAL PURPOSES ONLY.  IF CONFIRMATION IS NEEDED FOR ANY PURPOSE, NOTIFY LAB WITHIN 5 DAYS.        LOWEST DETECTABLE LIMITS FOR URINE DRUG SCREEN Drug Class       Cutoff (ng/mL) Amphetamine      1000 Barbiturate      200 Benzodiazepine   315 Tricyclics       400 Opiates          300 Cocaine          300 THC              50   Urinalysis, Routine  w reflex microscopic (not at [facility])     Status: Abnormal   Collection Time: 10/20/16  2:37 PM  Result Value Ref Range   Color, Urine YELLOW YELLOW   APPearance CLEAR CLEAR   Specific Gravity, Urine 1.007 1.005 - 1.030   pH 5.5 5.0 - 8.0   Glucose, UA 100 (A) NEGATIVE mg/dL   Hgb urine dipstick NEGATIVE NEGATIVE   Bilirubin Urine NEGATIVE NEGATIVE    Ketones, ur NEGATIVE NEGATIVE mg/dL   Protein, ur NEGATIVE NEGATIVE mg/dL   Nitrite NEGATIVE NEGATIVE   Leukocytes, UA NEGATIVE NEGATIVE    Comment: MICROSCOPIC NOT DONE ON URINES WITH NEGATIVE PROTEIN, BLOOD, LEUKOCYTES, NITRITE, OR GLUCOSE <1000 mg/dL.    Ct Head Wo Contrast  Result Date: 10/20/2016 CLINICAL DATA:  Tongue numbness and left facial droop, numbness and tingling in the hands and fingers. EXAM: CT HEAD WITHOUT CONTRAST TECHNIQUE: Contiguous axial images were obtained from the base of the skull through the vertex without intravenous contrast. COMPARISON:  None. FINDINGS: Brain: There is nail hemorrhage identified. There is a moderate-sized focal hypodensity within the right frontal lobe white matter, this measures approximately 5 cm AP on sagittal images by 1.7 cm cranial caudad. No midline shift. The ventricles are nonenlarged. Vascular: No hyperdense vessels.  No unexpected calcifications. Skull: Mastoid air cells are clear. There is no skull fracture identified. Sinuses/Orbits: Minimal mucosal thickening in the ethmoid and right sphenoid sinus. No acute orbital abnormality. Other: None IMPRESSION: Focal hypodensity within the white matter of the right frontal lobe, this may represent an area of edema, either related to a mass or possible ischemia. MRI with and without contrast is recommended for further evaluation. Electronically Signed   By: Donavan Foil M.D.   On: 10/20/2016 14:20   Mr Jeri Cos TO Contrast  Result Date: 10/20/2016 CLINICAL DATA:  Left facial droop.  Tingling and numbness. EXAM: MRI HEAD WITHOUT AND WITH CONTRAST TECHNIQUE: Multiplanar, multiecho pulse sequences of the brain and surrounding structures were obtained without and with intravenous contrast. CONTRAST:  61m MULTIHANCE GADOBENATE DIMEGLUMINE 529 MG/ML IV SOLN COMPARISON:  Head CT 10/20/2016 FINDINGS: Brain: There is a focal area of diffusion restriction and hyperintense T2 weighted signal within the white  matter of the right frontal lobe, measuring approximately 3.5 x 1.6 cm. There is no associated contrast enhancement.There is no associated midline shift or mass effect. No other focal lesions are identified. There is no evidence of intracranial hemorrhage. Vascular: The major intracranial flow voids are preserved. Skull and upper cervical spine: Calvarium and visualized upper cervical spine are normal. Sinuses/Orbits: Normal sinuses and orbits. Other: None IMPRESSION: 1. Area of diffusion restriction and hyperintense T2 weighted signal within the right frontal white matter. The distribution is not typical of ischemia and the appearance is concerning for a neoplastic process, such as a low grade glioma. Tumefactive demyelinating disease is also a consideration, though if the patient's symptoms are truly acute, some degree of contrast enhancement would be expected. Short-term follow-up with brain MRI with and without contrast in 4-6 weeks is recommended, as change over time may be helpful in diagnosis. Additionally, CSF sampling should be considered. 2. No hemorrhage or mass effect. Electronically Signed   By: KUlyses JarredM.D.   On: 10/20/2016 17:12    Review of Systems  Neurological: Positive for dizziness, sensory change, speech change and headaches.  All other systems reviewed and are negative.  Blood pressure (!) 165/105, pulse 84, temperature 98.5 F (36.9 C), temperature source Oral, resp.  rate 13, SpO2 95 %. Physical Exam  Constitutional: He is oriented to person, place, and time.  Neurological: He is alert and oriented to person, place, and time. He has normal strength. GCS eye subscore is 4. GCS verbal subscore is 5. GCS motor subscore is 6.  Patient is awake and alert pupils are equal Signe Tackitt nerves are intact strength is 5 out of 5 in his upper and lower extremities no pronator drift no dysmetria speech is a little labored and slurred naming appears to be intact 2     Assessment/Plan: 50 year old gentleman presents with speech change some nondescript and non-diffuse numbness tongue and arms and hands and abnormal region on his CT and MRI that appears to be consistent with either low-grade glioma also I think it could be possible white matter lesion versus questionable whether this relates to his remote trauma although it does appear to be unusual for a traumatic hemorrhagic contusion without any evidence of peripheral enhancement. Recommended formal medical neurology consult I think the patient can be discharged on steroids if cleared by medical neurology was scheduled follow-up with both neurology and neurosurgery when one to 2 weeks.  Malikah Lakey P 10/20/2016, 6:45 PM

## 2016-10-21 ENCOUNTER — Emergency Department (HOSPITAL_COMMUNITY): Payer: 59

## 2016-10-21 ENCOUNTER — Inpatient Hospital Stay (HOSPITAL_COMMUNITY): Payer: 59

## 2016-10-21 ENCOUNTER — Other Ambulatory Visit (HOSPITAL_COMMUNITY): Payer: Self-pay

## 2016-10-21 ENCOUNTER — Inpatient Hospital Stay (HOSPITAL_COMMUNITY)
Admission: EM | Admit: 2016-10-21 | Discharge: 2016-11-27 | DRG: 064 | Disposition: E | Payer: 59 | Attending: Emergency Medicine | Admitting: Emergency Medicine

## 2016-10-21 DIAGNOSIS — R339 Retention of urine, unspecified: Secondary | ICD-10-CM

## 2016-10-21 DIAGNOSIS — R569 Unspecified convulsions: Secondary | ICD-10-CM | POA: Diagnosis not present

## 2016-10-21 DIAGNOSIS — D72829 Elevated white blood cell count, unspecified: Secondary | ICD-10-CM | POA: Diagnosis not present

## 2016-10-21 DIAGNOSIS — E785 Hyperlipidemia, unspecified: Secondary | ICD-10-CM | POA: Diagnosis present

## 2016-10-21 DIAGNOSIS — E119 Type 2 diabetes mellitus without complications: Secondary | ICD-10-CM | POA: Diagnosis not present

## 2016-10-21 DIAGNOSIS — G9340 Encephalopathy, unspecified: Secondary | ICD-10-CM | POA: Diagnosis present

## 2016-10-21 DIAGNOSIS — I160 Hypertensive urgency: Secondary | ICD-10-CM | POA: Diagnosis present

## 2016-10-21 DIAGNOSIS — G936 Cerebral edema: Secondary | ICD-10-CM | POA: Diagnosis present

## 2016-10-21 DIAGNOSIS — I6529 Occlusion and stenosis of unspecified carotid artery: Secondary | ICD-10-CM

## 2016-10-21 DIAGNOSIS — J9601 Acute respiratory failure with hypoxia: Secondary | ICD-10-CM | POA: Diagnosis not present

## 2016-10-21 DIAGNOSIS — E1165 Type 2 diabetes mellitus with hyperglycemia: Secondary | ICD-10-CM | POA: Diagnosis present

## 2016-10-21 DIAGNOSIS — I6330 Cerebral infarction due to thrombosis of unspecified cerebral artery: Secondary | ICD-10-CM | POA: Insufficient documentation

## 2016-10-21 DIAGNOSIS — I6523 Occlusion and stenosis of bilateral carotid arteries: Secondary | ICD-10-CM | POA: Diagnosis not present

## 2016-10-21 DIAGNOSIS — Z9049 Acquired absence of other specified parts of digestive tract: Secondary | ICD-10-CM

## 2016-10-21 DIAGNOSIS — J9600 Acute respiratory failure, unspecified whether with hypoxia or hypercapnia: Secondary | ICD-10-CM

## 2016-10-21 DIAGNOSIS — E118 Type 2 diabetes mellitus with unspecified complications: Secondary | ICD-10-CM | POA: Diagnosis not present

## 2016-10-21 DIAGNOSIS — I159 Secondary hypertension, unspecified: Secondary | ICD-10-CM | POA: Diagnosis present

## 2016-10-21 DIAGNOSIS — I63033 Cerebral infarction due to thrombosis of bilateral carotid arteries: Secondary | ICD-10-CM | POA: Diagnosis not present

## 2016-10-21 DIAGNOSIS — R4020 Unspecified coma: Secondary | ICD-10-CM

## 2016-10-21 DIAGNOSIS — F1020 Alcohol dependence, uncomplicated: Secondary | ICD-10-CM | POA: Diagnosis present

## 2016-10-21 DIAGNOSIS — R1310 Dysphagia, unspecified: Secondary | ICD-10-CM | POA: Diagnosis present

## 2016-10-21 DIAGNOSIS — F17210 Nicotine dependence, cigarettes, uncomplicated: Secondary | ICD-10-CM | POA: Diagnosis present

## 2016-10-21 DIAGNOSIS — G9389 Other specified disorders of brain: Secondary | ICD-10-CM

## 2016-10-21 DIAGNOSIS — Z0189 Encounter for other specified special examinations: Secondary | ICD-10-CM

## 2016-10-21 DIAGNOSIS — I69391 Dysphagia following cerebral infarction: Secondary | ICD-10-CM

## 2016-10-21 DIAGNOSIS — Z7189 Other specified counseling: Secondary | ICD-10-CM | POA: Diagnosis not present

## 2016-10-21 DIAGNOSIS — E7220 Disorder of urea cycle metabolism, unspecified: Secondary | ICD-10-CM | POA: Diagnosis not present

## 2016-10-21 DIAGNOSIS — J690 Pneumonitis due to inhalation of food and vomit: Secondary | ICD-10-CM | POA: Diagnosis not present

## 2016-10-21 DIAGNOSIS — Z833 Family history of diabetes mellitus: Secondary | ICD-10-CM

## 2016-10-21 DIAGNOSIS — Z66 Do not resuscitate: Secondary | ICD-10-CM | POA: Diagnosis not present

## 2016-10-21 DIAGNOSIS — Z515 Encounter for palliative care: Secondary | ICD-10-CM | POA: Diagnosis not present

## 2016-10-21 DIAGNOSIS — Z6830 Body mass index (BMI) 30.0-30.9, adult: Secondary | ICD-10-CM | POA: Diagnosis not present

## 2016-10-21 DIAGNOSIS — Z7984 Long term (current) use of oral hypoglycemic drugs: Secondary | ICD-10-CM | POA: Diagnosis not present

## 2016-10-21 DIAGNOSIS — F1010 Alcohol abuse, uncomplicated: Secondary | ICD-10-CM

## 2016-10-21 DIAGNOSIS — F329 Major depressive disorder, single episode, unspecified: Secondary | ICD-10-CM

## 2016-10-21 DIAGNOSIS — K219 Gastro-esophageal reflux disease without esophagitis: Secondary | ICD-10-CM | POA: Diagnosis present

## 2016-10-21 DIAGNOSIS — I6501 Occlusion and stenosis of right vertebral artery: Secondary | ICD-10-CM | POA: Diagnosis present

## 2016-10-21 DIAGNOSIS — E663 Overweight: Secondary | ICD-10-CM | POA: Diagnosis present

## 2016-10-21 DIAGNOSIS — R401 Stupor: Secondary | ICD-10-CM | POA: Diagnosis not present

## 2016-10-21 DIAGNOSIS — I63032 Cerebral infarction due to thrombosis of left carotid artery: Secondary | ICD-10-CM | POA: Diagnosis not present

## 2016-10-21 DIAGNOSIS — I6300 Cerebral infarction due to thrombosis of unspecified precerebral artery: Secondary | ICD-10-CM | POA: Diagnosis not present

## 2016-10-21 DIAGNOSIS — T426X5A Adverse effect of other antiepileptic and sedative-hypnotic drugs, initial encounter: Secondary | ICD-10-CM | POA: Diagnosis present

## 2016-10-21 DIAGNOSIS — Z8249 Family history of ischemic heart disease and other diseases of the circulatory system: Secondary | ICD-10-CM

## 2016-10-21 DIAGNOSIS — G939 Disorder of brain, unspecified: Secondary | ICD-10-CM | POA: Diagnosis not present

## 2016-10-21 DIAGNOSIS — K567 Ileus, unspecified: Secondary | ICD-10-CM

## 2016-10-21 DIAGNOSIS — I63233 Cerebral infarction due to unspecified occlusion or stenosis of bilateral carotid arteries: Secondary | ICD-10-CM | POA: Diagnosis present

## 2016-10-21 DIAGNOSIS — K7290 Hepatic failure, unspecified without coma: Secondary | ICD-10-CM | POA: Diagnosis present

## 2016-10-21 DIAGNOSIS — Z978 Presence of other specified devices: Secondary | ICD-10-CM | POA: Diagnosis not present

## 2016-10-21 DIAGNOSIS — I63523 Cerebral infarction due to unspecified occlusion or stenosis of bilateral anterior cerebral arteries: Secondary | ICD-10-CM | POA: Diagnosis not present

## 2016-10-21 DIAGNOSIS — Z4659 Encounter for fitting and adjustment of other gastrointestinal appliance and device: Secondary | ICD-10-CM

## 2016-10-21 DIAGNOSIS — I639 Cerebral infarction, unspecified: Secondary | ICD-10-CM | POA: Diagnosis not present

## 2016-10-21 DIAGNOSIS — Z79899 Other long term (current) drug therapy: Secondary | ICD-10-CM

## 2016-10-21 DIAGNOSIS — R400 Somnolence: Secondary | ICD-10-CM | POA: Diagnosis not present

## 2016-10-21 DIAGNOSIS — J9690 Respiratory failure, unspecified, unspecified whether with hypoxia or hypercapnia: Secondary | ICD-10-CM

## 2016-10-21 DIAGNOSIS — I10 Essential (primary) hypertension: Secondary | ICD-10-CM | POA: Diagnosis not present

## 2016-10-21 DIAGNOSIS — B999 Unspecified infectious disease: Secondary | ICD-10-CM

## 2016-10-21 DIAGNOSIS — G4733 Obstructive sleep apnea (adult) (pediatric): Secondary | ICD-10-CM | POA: Diagnosis present

## 2016-10-21 DIAGNOSIS — I6789 Other cerebrovascular disease: Secondary | ICD-10-CM | POA: Diagnosis not present

## 2016-10-21 DIAGNOSIS — R4182 Altered mental status, unspecified: Secondary | ICD-10-CM | POA: Diagnosis present

## 2016-10-21 DIAGNOSIS — R0600 Dyspnea, unspecified: Secondary | ICD-10-CM

## 2016-10-21 DIAGNOSIS — R1110 Vomiting, unspecified: Secondary | ICD-10-CM | POA: Diagnosis not present

## 2016-10-21 DIAGNOSIS — R509 Fever, unspecified: Secondary | ICD-10-CM | POA: Diagnosis not present

## 2016-10-21 LAB — CBC WITH DIFFERENTIAL/PLATELET
BASOS ABS: 0 10*3/uL (ref 0.0–0.1)
BASOS PCT: 0 %
EOS PCT: 0 %
Eosinophils Absolute: 0 10*3/uL (ref 0.0–0.7)
HCT: 45.7 % (ref 39.0–52.0)
Hemoglobin: 16.3 g/dL (ref 13.0–17.0)
Lymphocytes Relative: 10 %
Lymphs Abs: 0.6 10*3/uL — ABNORMAL LOW (ref 0.7–4.0)
MCH: 30.9 pg (ref 26.0–34.0)
MCHC: 35.7 g/dL (ref 30.0–36.0)
MCV: 86.6 fL (ref 78.0–100.0)
Monocytes Absolute: 0.1 10*3/uL (ref 0.1–1.0)
Monocytes Relative: 2 %
Neutro Abs: 5 10*3/uL (ref 1.7–7.7)
Neutrophils Relative %: 88 %
PLATELETS: 252 10*3/uL (ref 150–400)
RBC: 5.28 MIL/uL (ref 4.22–5.81)
RDW: 12.5 % (ref 11.5–15.5)
WBC: 5.7 10*3/uL (ref 4.0–10.5)

## 2016-10-21 LAB — BASIC METABOLIC PANEL
Anion gap: 10 (ref 5–15)
BUN: 17 mg/dL (ref 6–20)
CALCIUM: 9.7 mg/dL (ref 8.9–10.3)
CO2: 22 mmol/L (ref 22–32)
CREATININE: 0.87 mg/dL (ref 0.61–1.24)
Chloride: 101 mmol/L (ref 101–111)
GFR calc Af Amer: >60 mL/min (ref >60)
GLUCOSE: 292 mg/dL — AB (ref 65–99)
Potassium: 4.6 mmol/L (ref 3.5–5.1)
Sodium: 133 mmol/L — ABNORMAL LOW (ref 135–145)

## 2016-10-21 LAB — CBG MONITORING, ED: Glucose-Capillary: 240 mg/dL — ABNORMAL HIGH (ref 65–99)

## 2016-10-21 LAB — GLUCOSE, CAPILLARY: Glucose-Capillary: 226 mg/dL — ABNORMAL HIGH (ref 65–99)

## 2016-10-21 MED ORDER — INSULIN ASPART 100 UNIT/ML ~~LOC~~ SOLN
0.0000 [IU] | Freq: Every day | SUBCUTANEOUS | Status: DC
  Administered 2016-10-21 – 2016-10-22 (×2): 2 [IU] via SUBCUTANEOUS

## 2016-10-21 MED ORDER — LORAZEPAM 2 MG/ML IJ SOLN
0.0000 mg | Freq: Two times a day (BID) | INTRAMUSCULAR | Status: DC
  Administered 2016-10-23: 2 mg via INTRAVENOUS
  Filled 2016-10-21: qty 1

## 2016-10-21 MED ORDER — LEVETIRACETAM 500 MG PO TABS: 500.0000 mg | ORAL_TABLET | Freq: Two times a day (BID) | ORAL | Status: DC

## 2016-10-21 MED ORDER — SODIUM CHLORIDE 0.9 % IV SOLN: 500.0000 mg | Freq: Once | INTRAVENOUS | Status: DC

## 2016-10-21 MED ORDER — THIAMINE HCL 100 MG/ML IJ SOLN
100.0000 mg | Freq: Every day | INTRAMUSCULAR | Status: DC
  Administered 2016-10-21 – 2016-10-26 (×5): 100 mg via INTRAVENOUS
  Filled 2016-10-21 (×5): qty 2

## 2016-10-21 MED ORDER — ATORVASTATIN CALCIUM 10 MG PO TABS: 10.0000 mg | ORAL_TABLET | Freq: Every day | ORAL | Status: DC

## 2016-10-21 MED ORDER — SODIUM CHLORIDE 0.9 % IV SOLN: INTRAVENOUS | Status: DC

## 2016-10-21 MED ORDER — LORAZEPAM 2 MG/ML IJ SOLN
0.0000 mg | Freq: Four times a day (QID) | INTRAMUSCULAR | Status: DC
  Administered 2016-10-22: 2 mg via INTRAVENOUS
  Filled 2016-10-21: qty 1

## 2016-10-21 MED ORDER — DEXAMETHASONE SODIUM PHOSPHATE 4 MG/ML IJ SOLN
8.0000 mg | Freq: Two times a day (BID) | INTRAMUSCULAR | Status: DC
  Administered 2016-10-21 – 2016-10-22 (×2): 8 mg via INTRAVENOUS
  Filled 2016-10-21 (×2): qty 2

## 2016-10-21 MED ORDER — LISINOPRIL 5 MG PO TABS
2.5000 mg | ORAL_TABLET | Freq: Every day | ORAL | Status: DC
  Filled 2016-10-21: qty 1

## 2016-10-21 MED ORDER — ACETAMINOPHEN 650 MG RE SUPP
650.0000 mg | Freq: Four times a day (QID) | RECTAL | Status: DC | PRN
  Administered 2016-10-27: 650 mg via RECTAL
  Filled 2016-10-21: qty 1

## 2016-10-21 MED ORDER — PANTOPRAZOLE SODIUM 40 MG PO TBEC
40.0000 mg | DELAYED_RELEASE_TABLET | Freq: Every day | ORAL | Status: DC
  Filled 2016-10-21: qty 1

## 2016-10-21 MED ORDER — INSULIN ASPART 100 UNIT/ML ~~LOC~~ SOLN
0.0000 [IU] | Freq: Three times a day (TID) | SUBCUTANEOUS | Status: DC
  Administered 2016-10-22: 3 [IU] via SUBCUTANEOUS
  Administered 2016-10-22 – 2016-10-23 (×3): 5 [IU] via SUBCUTANEOUS

## 2016-10-21 MED ORDER — ACETAMINOPHEN 325 MG PO TABS
650.0000 mg | ORAL_TABLET | Freq: Four times a day (QID) | ORAL | Status: DC | PRN
  Administered 2016-10-25 – 2016-10-30 (×4): 650 mg via ORAL
  Filled 2016-10-21 (×4): qty 2

## 2016-10-21 MED ORDER — SODIUM CHLORIDE 0.9 % IV SOLN
500.0000 mg | Freq: Once | INTRAVENOUS | Status: AC
  Administered 2016-10-21: 500 mg via INTRAVENOUS
  Filled 2016-10-21: qty 5

## 2016-10-21 MED ORDER — VITAMIN B-1 100 MG PO TABS
100.0000 mg | ORAL_TABLET | Freq: Every day | ORAL | Status: DC
  Administered 2016-10-27 – 2016-11-05 (×10): 100 mg via ORAL
  Filled 2016-10-21 (×10): qty 1

## 2016-10-21 MED ORDER — SODIUM CHLORIDE 0.9 % IV SOLN
1000.0000 mg | Freq: Once | INTRAVENOUS | Status: AC
  Administered 2016-10-21: 1000 mg via INTRAVENOUS
  Filled 2016-10-21: qty 10

## 2016-10-21 MED ORDER — SODIUM CHLORIDE 0.9 % IV BOLUS (SEPSIS)
500.0000 mL | Freq: Once | INTRAVENOUS | Status: AC
  Administered 2016-10-21: 500 mL via INTRAVENOUS

## 2016-10-21 MED ORDER — FLUOXETINE HCL 20 MG PO CAPS: 20.0000 mg | ORAL_CAPSULE | Freq: Every morning | ORAL | Status: DC

## 2016-10-21 NOTE — ED Triage Notes (Signed)
Family reports Pt is worse this AM than on the 10-20-16.

## 2016-10-21 NOTE — ED Provider Notes (Signed)
[facility] DEPT Provider Note   CSN: [id number] Arrival date & time: 10/18/2016  0740     History   Chief Complaint Chief Complaint  Patient presents with  . Seizures    HPI Scott Harris is a 50 y.o. male.  He presents for evaluation of possible seizure. His wife states that while sleeping last night, he had 2 episodes of shaking. She is not sure which arm was shaking. Around 5 AM this morning. He was getting out of bed and she noticed his arm shaking, and he told her he was trying to put on his pants. He was confused at that time. She assisted him to the bathroom. Later she decided to bring him to the emergency department for evaluation. 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 He was discharged after receiving oral Keppra, and Decadron. He has prescriptions for these, but has not started them yet. He did not eat breakfast this morning. There's been no fever, vomiting, cough or complaint of shortness of breath. Patient is unable to give history.   Level V caveat- confusion  HPI  Past Medical History:  Diagnosis Date  . Diabetes mellitus without complication ([place])   . Hyperlipemia   . Hypertension     There are no active problems to display for this patient.   Past Surgical History:  Procedure Laterality Date  . CHOLECYSTECTOMY    . HAND SURGERY    . TONSILLECTOMY         Home Medications    Prior to Admission medications   Medication Sig Start Date End Date Taking? Authorizing Provider  atorvastatin (LIPITOR) 10 MG tablet Take 5 mg by mouth daily.    Historical Provider, MD  ciprofloxacin (CILOXAN) 0.3 % ophthalmic solution Place 1 drop into the left eye as directed. Every 5 hours for a total of 5 days, to be completed around 10/24/16 10/17/16 10/22/16  Historical Provider, MD  dexamethasone (DECADRON) 4 MG tablet Take 2 tablets (8 mg total) by mouth 2 (two) times daily. 10/20/16   Charlesetta Shanks, MD  FLUoxetine (PROZAC) 20 MG capsule Take 20 mg by mouth every morning.  09/04/16   Historical Provider, MD  gabapentin (NEURONTIN) 300 MG capsule Take 600 mg by mouth at bedtime.    Historical Provider, MD  ibuprofen (ADVIL,MOTRIN) 200 MG tablet Take 400 mg by mouth every 4 (four) hours as needed for moderate pain.    Historical Provider, MD  lansoprazole (PREVACID) 30 MG capsule Take 30 mg by mouth every morning.  06/19/16   Historical Provider, MD  levETIRAcetam (KEPPRA) 500 MG tablet Take 1 tablet (500 mg total) by mouth 2 (two) times daily. 10/20/16   Charlesetta Shanks, MD  lisinopril (PRINIVIL,ZESTRIL) 2.5 MG tablet Take 2.5 mg by mouth daily.    Historical Provider, MD  methocarbamol (ROBAXIN) 500 MG tablet Can take up to 1-2 tabs every 6 hours PRN PAIN Patient taking differently: Take 500-1,000 mg by mouth every 6 (six) hours as needed for muscle spasms.  10/13/16   Nicole Pisciotta, PA-C  omeprazole (PRILOSEC) 20 MG capsule Take 1 capsule (20 mg total) by mouth daily. 10/20/16   Charlesetta Shanks, MD  oxyCODONE-acetaminophen (PERCOCET) 5-325 MG tablet Take 1 tablet by mouth every 4 (four) hours as needed. Patient taking differently: Take 1 tablet by mouth every 4 (four) hours as needed for severe pain.  10/13/16   Nicole Pisciotta, PA-C  SitaGLIPtin-MetFORMIN HCl 50-1000 MG TB24 Take  1 tablet by mouth every morning. 06/19/16   Historical Provider, MD    Family History No family history on file.  Social History Social History  Substance Use Topics  . Smoking status: Current Every Day Smoker    Packs/day: 1.50    Years: 20.00    Types: Cigarettes  . Smokeless tobacco: Never Used  . Alcohol use Yes     Comment: Socially      Allergies   Patient has no known allergies.   Review of Systems Review of Systems  Unable to perform ROS: Mental status change     Physical Exam Updated Vital Signs BP (!) 155/107 (BP Location: Right Arm)   Pulse 82   Temp 98.6 F (37 C)  (Oral)   Resp 18   SpO2 94%   Physical Exam  Constitutional: He appears well-developed and well-nourished. He appears distressed (He is uncomfortable.).  HENT:  Head: Normocephalic and atraumatic.  Right Ear: External ear normal.  Left Ear: External ear normal.  Eyes: Conjunctivae and EOM are normal. Pupils are equal, round, and reactive to light.  Neck: Normal range of motion and phonation normal. Neck supple.  Cardiovascular: Normal rate and regular rhythm.   Hypertensive  Pulmonary/Chest: Effort normal and breath sounds normal. He exhibits no bony tenderness.  Abdominal: Soft. There is no tenderness.  Musculoskeletal: Normal range of motion.  Neurological: He is alert.  Lethargic, intermittently sleeping. Arousable to voice, speaks, without dysarthria, confused. Mild clonus right arm. Mild left facial droop is present. Normal tone, legs and left arm.  Skin: Skin is warm, dry and intact. No erythema.  Psychiatric:  Lethargic  Nursing note and vitals reviewed.    ED Treatments / Results  Labs (all labs ordered are listed, but only abnormal results are displayed) Labs Reviewed  BASIC METABOLIC PANEL - Abnormal; Notable for the following:       Result Value   Sodium 133 (*)    Glucose, Bld 292 (*)    All other components within normal limits  CBC WITH DIFFERENTIAL/PLATELET - Abnormal; Notable for the following:    Lymphs Abs 0.6 (*)    All other components within normal limits    EKG  EKG Interpretation None       Radiology Ct Head Wo Contrast  Result Date: 10/20/2016 CLINICAL DATA:  Tongue numbness and left facial droop, numbness and tingling in the hands and fingers. EXAM: CT HEAD WITHOUT CONTRAST TECHNIQUE: Contiguous axial images were obtained from the base of the skull through the vertex without intravenous contrast. COMPARISON:  None. FINDINGS: Brain: There is nail hemorrhage identified. There is a moderate-sized focal hypodensity within the right frontal lobe  white matter, this measures approximately 5 cm AP on sagittal images by 1.7 cm cranial caudad. No midline shift. The ventricles are nonenlarged. Vascular: No hyperdense vessels.  No unexpected calcifications. Skull: Mastoid air cells are clear. There is no skull fracture identified. Sinuses/Orbits: Minimal mucosal thickening in the ethmoid and right sphenoid sinus. No acute orbital abnormality. Other: None IMPRESSION: Focal hypodensity within the white matter of the right frontal lobe, this may represent an area of edema, either related to a mass or possible ischemia. MRI with and without contrast is recommended for further evaluation. Electronically Signed   By: Donavan Foil M.D.   On: 10/20/2016 14:20   Mr Jeri Cos [id number] Contrast  Result Date: 10/20/2016 CLINICAL DATA:  Left facial droop.  Tingling and numbness. EXAM: MRI HEAD WITHOUT AND WITH CONTRAST TECHNIQUE: Multiplanar,  multiecho pulse sequences of the brain and surrounding structures were obtained without and with intravenous contrast. CONTRAST:  10mL MULTIHANCE GADOBENATE DIMEGLUMINE 529 MG/ML IV SOLN COMPARISON:  Head CT 10/20/2016 FINDINGS: Brain: There is a focal area of diffusion restriction and hyperintense T2 weighted signal within the white matter of the right frontal lobe, measuring approximately 3.5 x 1.6 cm. There is no associated contrast enhancement.There is no associated midline shift or mass effect. No other focal lesions are identified. There is no evidence of intracranial hemorrhage. Vascular: The major intracranial flow voids are preserved. Skull and upper cervical spine: Calvarium and visualized upper cervical spine are normal. Sinuses/Orbits: Normal sinuses and orbits. Other: None IMPRESSION: 1. Area of diffusion restriction and hyperintense T2 weighted signal within the right frontal white matter. The distribution is not typical of ischemia and the appearance is concerning for a neoplastic process, such as a low grade glioma.  Tumefactive demyelinating disease is also a consideration, though if the patient's symptoms are truly acute, some degree of contrast enhancement would be expected. Short-term follow-up with brain MRI with and without contrast in 4-6 weeks is recommended, as change over time may be helpful in diagnosis. Additionally, CSF sampling should be considered. 2. No hemorrhage or mass effect. Electronically Signed   By: Ulyses Jarred M.D.   On: 10/20/2016 17:12    Procedures Procedures (including critical care time)  Medications Ordered in ED Medications  levETIRAcetam (KEPPRA) 500 mg in sodium chloride 0.9 % 100 mL IVPB (not administered)  thiamine (VITAMIN B-1) tablet 100 mg (not administered)    Or  thiamine (B-1) injection 100 mg (not administered)  LORazepam (ATIVAN) injection 0-4 mg (not administered)    Followed by  LORazepam (ATIVAN) injection 0-4 mg (not administered)  sodium chloride 0.9 % bolus 500 mL (500 mLs Intravenous New Bag/Given 10/22/2016 0837)  levETIRAcetam (KEPPRA) 1,000 mg in sodium chloride 0.9 % 100 mL IVPB (1,000 mg Intravenous New Bag/Given 09/29/2016 0837)     Initial Impression / Assessment and Plan / ED Course  I have reviewed the triage vital signs and the nursing notes.  Pertinent labs & imaging results that were available during my care of the patient were reviewed by me and considered in my medical decision making (see chart for details).  Clinical Course as of Oct 21 1022  Sat Oct 21, 2016  [id number] He has been evaluated by neuro hospitalist, who has requested continuous EEG monitoring. He apparently contacted the technician for that. Patient is a known alcoholic, so he may be going through alcohol withdrawal, which may require additional control with benzodiazepine.  [EW]    Clinical Course User Index [EW] Daleen Bo, MD    Medications  levETIRAcetam (KEPPRA) 500 mg in sodium chloride 0.9 % 100 mL IVPB (not administered)  thiamine (VITAMIN B-1) tablet 100 mg  (not administered)    Or  thiamine (B-1) injection 100 mg (not administered)  LORazepam (ATIVAN) injection 0-4 mg (not administered)    Followed by  LORazepam (ATIVAN) injection 0-4 mg (not administered)  sodium chloride 0.9 % bolus 500 mL (500 mLs Intravenous New Bag/Given 10/05/2016 0837)  levETIRAcetam (KEPPRA) 1,000 mg in sodium chloride 0.9 % 100 mL IVPB (1,000 mg Intravenous New Bag/Given 10/24/2016 0837)    Patient Vitals for the past 24 hrs:  BP Temp Temp src Pulse Resp SpO2  10/04/2016 0753 - - - - - 94 %  09/29/2016 0750 (!) 155/107 98.6 F (37 C) Oral 82 18 91 %  10:23 AM Reevaluation with update and discussion. After initial assessment and treatment, an updated evaluation reveals No change in clinical status. Remains on continuous EEG monitoring. He is verbally responsive, eyes open, with right-sided gaze. Wife updated on findings. Eutha Cude L    10:24 AM-Consult complete with [facility] Resident. Patient case explained and discussed. He agrees to admit patient for further evaluation and treatment. Call ended at 10:40  CRITICAL CARE Performed by: Richarda Blade Total critical care time: 40 minutes Critical care time was exclusive of separately billable procedures and treating other patients. Critical care was necessary to treat or prevent imminent or life-threatening deterioration. Critical care was time spent personally by me on the following activities: development of treatment plan with patient and/or surrogate as well as nursing, discussions with consultants, evaluation of patient's response to treatment, examination of patient, obtaining history from patient or surrogate, ordering and performing treatments and interventions, ordering and review of laboratory studies, ordering and review of radiographic studies, pulse oximetry and re-evaluation of patient's condition.    Final Clinical Impressions(s) / ED Diagnoses   Final diagnoses:  Frontal mass of brain  Altered mental  status, unspecified altered mental status type  Alcoholism ([place])  Secondary hypertension    Altered mental status, with known right brain mass, associated with edema. Symptoms progressed, within the last 24 hours, to include possible seizure. Patient being evaluated for subclinical seizures, with tenderness EEG monitoring. He did not require benzodiazepines, during, ED evaluation, between arrival and 10:30 AM. Thiamine ordered. Admission for monitoring and treatment, with neurology consultation.  Nursing Notes Reviewed/ Care Coordinated, and agree without changes. Applicable Imaging Reviewed.  Interpretation of Laboratory Data incorporated into ED treatment   Plan: Admit  New Prescriptions New Prescriptions   No medications on file     Daleen Bo, MD 10/26/2016 1044

## 2016-10-21 NOTE — Progress Notes (Deleted)
Pt vital signs stable, alert and oriented x 4, transferring pt to [phone]

## 2016-10-21 NOTE — Progress Notes (Signed)
This note also relates to the following rows which could not be included: Pulse Rate - Cannot attach notes to unvalidated device data ECG Heart Rate - Cannot attach notes to unvalidated device data Resp - Cannot attach notes to unvalidated device data SpO2 - Cannot attach notes to unvalidated device data    10/24/2016 2245  Vitals  BP (!) 170/90  BP Location Right Arm  BP Method Manual  Patient Position (if appropriate) Lying  pt b/p have been running high 170's/100's, notified doctor and was informed to do manuel b/p, it was 170/90. Will notify doctor again with results.

## 2016-10-21 NOTE — Consult Note (Addendum)
NEURO HOSPITALIST CONSULT NOTE   Requestig physician: Dr. Eulis Foster   Reason for Consult:Status?    History obtained from:  Patient  And Chart and wife at bedside  HPI:                                                                                                                                          Scott Harris is an 50 y.o. male with newly diagnosed R frontal mass, DM2, ETOH abuse HLD and HTN who neurology is being consulted for potential seizure?   PT was seen yesterday evening at [facility]. Basically he was having confusion for the past 2 weeks as well as headaches and paresthesias in his extremities R>L. MRI was done which showed lesion in the R fontal lobe. He was placed on Decadron and outpt neurology referral and initiation of AED therapy (Keppra 500mg  BID ) was recommended. NSU follow up also recommended.   Pt was D/Ced from ED yesterday evening. PT went to bed and then early this AM wife noted symptoms again. She noted that he was disoriented and may have had a facial droop. He was having fragmented speech and she felt that his L hand was shaking but later she could not delineate which side was the problem. She was having trouble getting him to follow commands so she called EMS around 0500 who helped her get him to ED. She notes that he did not received his Keppra this morning and is unclear if he took the nightly tablet last night. Of note, pt drinks up to an 18 pack of beer per day. She notes he has "no off switch". His last drink was on Thanksgiving. She did not endorse him having, fevers, chills, systemic symptoms or other neurological symptoms.   In ED, upon my evaluation pt had a R gaze preference and would intermittently follow commands. No obvious seizure activity. He was given Keppra 1.5G load.   Past Medical History:  Diagnosis Date  . Diabetes mellitus without complication ([place])   . Hyperlipemia   . Hypertension     Past Surgical History:   Procedure Laterality Date  . CHOLECYSTECTOMY    . HAND SURGERY    . TONSILLECTOMY     Family History: No known family history of CNS disease   Social History:  reports that he has been smoking Cigarettes.  He has a 30.00 pack-year smoking history. He has never used smokeless tobacco. He reports that he drinks alcohol. He reports that he does not use drugs.  No Known Allergies  MEDICATIONS:  Medications reviewed: Pertinent medications:  Keppra 500mg  Q12H Decadron 8 Q12H Thiamine   ROS:                                                                                                                                       History obtained from wife:   General ROS: negative for - chills, fatigue, fever, night sweats, weight gain or weight loss Psychological ROS: + impulsivity, excess drinking  Ophthalmic ROS: negative for - blurry vision, double vision, eye pain or loss of vision ENT ROS: negative for - epistaxis, nasal discharge, oral lesions, sore throat, tinnitus or vertigo Allergy and Immunology ROS: negative for - hives or itchy/watery eyes Hematological and Lymphatic ROS: negative for - bleeding problems, bruising or swollen lymph nodes Endocrine ROS: negative for - galactorrhea, hair pattern changes, polydipsia/polyuria or temperature intolerance Respiratory ROS: negative for - cough, hemoptysis, shortness of breath or wheezing Cardiovascular ROS: negative for - chest pain, dyspnea on exertion, edema or irregular heartbeat Gastrointestinal ROS: negative for - abdominal pain, diarrhea, hematemesis, nausea/vomiting or stool incontinence Genito-Urinary ROS: negative for - dysuria, hematuria, incontinence or urinary frequency/urgency Musculoskeletal ROS: negative for - joint swelling or muscular weakness Neurological ROS: as noted in HPI Dermatological ROS: negative for  rash and skin lesion changes  Blood pressure (!) 155/107, pulse 82, temperature 98.6 F (37 C), temperature source Oral, resp. rate 18, SpO2 94 %.  Neurologic Examination:                                                                                                      HEENT-  Normocephalic, no lesions, without obvious abnormality.  Normal external eye and conjunctiva.  Normal external ears. Cardiovascular- RRR, S1,S2, no murmurs/gallops/rubs    Lungs- clear to auscultation anteriorly  Abdomen- soft, non distended  Extremities- no obvious edema  Psych - not completely oriented, slightly restless Musculoskeletal- no joint deformity or swelling  Skin-warm and dry   Neurological Examination Mental Status: Intermittently falls asleep during exam, thinks he is at [place] [facility], not oriented to year or purpose of hospitalization  Cranial Nerves: II: Unable to visualize disc 2/2 actively closing eyes upon attempt. Blinks to threat bilaterally, pupils equal, round, reactive to light  III,IV, VI: ptosis not present, extra-ocular motions intact bilaterally, R gaze preference but able to bury past midline to the L V,VII: Mild L facial droop, facial light touch sensation normal bilaterally VIII: hearing normal bilaterally IX,X: uvula rises symmetrically XI: bilateral shoulder shrug XII:  midline tongue extension Motor: -Antigravity with resistance of bilateral upper extremities, at least 4+ throughout,  hard to completely ascertian 2/2 cooperation -Bilateral lower extremities 5/5 throughout  Possible increased tone noted of RUE as pt preferred a flexed position toward trunk. no atrophy noted Sensory: Pinprick and light touch intact throughout, bilaterally Deep Tendon Reflexes: 2+ and symmetric throughout Plantars: Right: downgoing   Left: downgoing Cerebellar: Would not participate in formal cerebellar testing Gait: could not ambulate 2/2 lethargy   Lab Results: Basic Metabolic  Panel:  Recent Labs Lab 10/20/16 1322 10/20/16 1332  NA 134* 135  K 4.4 4.6  CL 101 101  CO2 23  --   GLUCOSE 214* 209*  BUN 14 16  CREATININE 0.81 0.80  CALCIUM 9.7  --     Liver Function Tests:  Recent Labs Lab 10/20/16 1322  AST 42*  ALT 97*  ALKPHOS 78  BILITOT 0.7  PROT 8.1  ALBUMIN 4.9   No results for input(s): LIPASE, AMYLASE in the last 168 hours. No results for input(s): AMMONIA in the last 168 hours.  CBC:  Recent Labs Lab 10/20/16 1322 10/20/16 1332  WBC 7.7  --   NEUTROABS 5.0  --   HGB 16.5 16.3  HCT 45.6 48.0  MCV 87.4  --   PLT 264  --     Cardiac Enzymes: No results for input(s): CKTOTAL, CKMB, CKMBINDEX, TROPONINI in the last 168 hours.  Lipid Panel: No results for input(s): CHOL, TRIG, HDL, CHOLHDL, VLDL, LDLCALC in the last 168 hours.  CBG:  Recent Labs Lab 10/20/16 1336  GLUCAP 185*    Microbiology: No results found for this or any previous visit.  Coagulation Studies:  Recent Labs  10/20/16 1322  LABPROT 12.0  INR 0.89   Neuroimaging personally reviewed: CT non contrast 11/25: R frontal lobe mass stable, no obvious hemorrhage  MRI brain w/wo 11/24: Diffusion restriction of R frontal lobe mass.   Assessment/Plan: Scott Harris is an 50 y.o. male with newly diagnosed R frontal mass, DM2, ETOH abuse HLD and HTN who neurology is being consulted for potential seizure?   PT with seizure-like activity prior to arrival and with intermittent confusion during my exam, most likely consistent with a post-ictal state. EEG done preliminary read without seizure activity. Pt is on Decadron for a newly diagnosed brain mass. His exam improved upon multiple re-examinations but not back to baseline. No focality to exam except for R eye deviation which also improved, so less likely this represents a new distinct process. CT head non-contrast was reassuring. PT was loaded with IV AED while in the ED. Would continue Decadron for now and admit  for close monitoring given multiple seizure in the past 24H. In terms of mass, pt has NSU and neurology follow up requested, however, could consider LP if NSU does not plan to biopsy.   1) R frontal lobe mass lesion -Cont Decadron 8Q12H can continue taper as an outpt -Consider discussing further work up with NSU in terms of if biopsy will be done   or if they would like CSF prior -If further work up needed prior to biopsy would consider LP: cells/glucose/protein/cellcount/culture/flow cytometry/cytology and CT chest abdomen and pelvis to evaluate for an indolent malignancy prior to brain biopsy  -FU with neurology / neurosurgery as an outpt   2) Seizures -FU read of EEG done in ED -Cont Keppra 500mg  Q12H -Goal Mg >2.0,  3) ETOH abuse -CIWA protocol while in house   PT to  refrain from driving/operating heavy machinery or participating in activities that could put himself or others at risk if consciousness was lost given his history of recent seizures.   Loney Hering, D.O.  [facility] [phone]

## 2016-10-21 NOTE — ED Triage Notes (Signed)
Pt last seen on 11 -24 -17 wit new DX of brain tumor. Pt per family worse thhis Am with seizure like activity.

## 2016-10-21 NOTE — ED Notes (Signed)
Luellen Pucker, RN and Leonel Ramsay, EMT done In and Out cath.

## 2016-10-21 NOTE — Procedures (Signed)
History: 50 yo M with altered mental status, concern for seizures.   Sedation: None  Technique: This is a 21 channel 2 hour scalp video-EEG performed at the bedside with bipolar and monopolar montages arranged in accordance to the international 10/20 system of electrode placement. One channel was dedicated to EKG recording.    Background: There is a well defined posterior dominant rhythm of 9-10 Hz that attenuates with eye opening. There is irregular delta that is most prominent at F7, T7 > F3,Fp1. Though more promiannt there, it really is more of a generalized pattern.   Photic stimulation: Physiologic driving is not performed  EEG Abnormalities: 1) Left frontotemporal slow activity.  2) Generalized irregualar slow activity.   Clinical Interpretation: This EEG is consistent with an area of dysfunction in the left frontotemporal region. However, given the imaging findings, I suspect that this actually represents a generalized dysfunction with focal attenuation seen on the right, given it the appearance of left sided focal abnormality. There is evidence of a generalized non-specific cerebral dysfunction(encephalopathy).  No seizure or definite evidence of seizure predisposition was seen. Please note that lack of epileptiform findings does not preclude the possibility of epilepsy.   Roland Rack, MD [facility] [phone]  If 7pm- 7am, please page neurology on call as listed in [place].

## 2016-10-21 NOTE — Progress Notes (Signed)
Prolonged EEG started. Dr Jaynee Eagles notified.

## 2016-10-21 NOTE — ED Triage Notes (Signed)
Per bladder scan Pt had 1031ml fluid in bladder. Internal Med called for I/O cath.

## 2016-10-21 NOTE — H&P (Signed)
Date: 10/02/2016               Patient Name:  Tolan Massingale MRN: [id number]  DOB: 07/26/1966 Age / Sex: 50 y.o., male   PCP: [facility]         Medical Service: Internal Medicine Teaching Service         Attending Physician: Dr. Sid Falcon, MD    First Contact: Dr. Inda Castle Pager: [phone]  Second Contact: Dr. Benjamine Mola Pager: [phone]       After Hours (After 5p/  First Contact Pager: [phone]  weekends / holidays): Second Contact Pager: [phone]   Chief Complaint: slurred speech, confusion  History of Present Illness: Mr Borsellino is a 50 year old man with history of DM2, HTN, and HL who presents with slurred speech, lethargy, and confusion since this morning.  History provided by wife.  Mr Rachlin presented to the [facility] ED yesterday with 2 weeks of increasing confusion, gait disturbance, and parasthesias.  Head CT and brain MRI revealed a large, right frontal white matter lesion without mass effect.  He was prescribed 8 mg dexamethasone BID, Keppra, 500 mg BID, and discharged since his neurologic status was grossly normal and he was in no acute distress.  Overnight after leaving the the ED he symptoms worsened with slurred speech, confusion, lethargy, and intermittent flailing movements of his left arm.  No loss of bowel of bladder continence, loss of consciousness, tongue biting.  His condition has rapidly deteriorated; at Thanksgiving, he was less talkative then normal, seemed somewhat distracted, and was slurring his speech slightly.  He was totally normal 3 weeks ago, with insidious and progressive symptoms until rapid changes in last 2 days.  Denies previous focal neurologic deficits including reduced visual acuity, changes in color vision, Lhermitte's, impaired bowel/bladder function, weakness, and numbness.  No prior seizures.  In the ED today he received 1000 mg Keppra and was placed on EEG monitoring with concern for seizures.  Meds:    Current Meds  Medication Sig  . atorvastatin (LIPITOR) 10 MG tablet Take 5 mg by mouth daily.  Marland Kitchen CIALIS 5 MG tablet TAKE ONE TABLET (5 MG TOTAL) BY MOUTH DAILY AS NEEDED FOR ERECTILE DYSFUNCTION.  . ciprofloxacin (CILOXAN) 0.3 % ophthalmic solution Place 1 drop into the left eye as directed. Every 5 hours for a total of 5 days, to be completed around 10/24/16  . dexamethasone (DECADRON) 4 MG tablet Take 2 tablets (8 mg total) by mouth 2 (two) times daily.  Marland Kitchen FLUoxetine (PROZAC) 20 MG capsule Take 20 mg by mouth every morning.   . gabapentin (NEURONTIN) 300 MG capsule Take 600 mg by mouth at bedtime.  Marland Kitchen ibuprofen (ADVIL,MOTRIN) 200 MG tablet Take 400 mg by mouth every 4 (four) hours as needed for moderate pain.  Marland Kitchen lansoprazole (PREVACID) 30 MG capsule Take 30 mg by mouth every morning.   . levETIRAcetam (KEPPRA) 500 MG tablet Take 1 tablet (500 mg total) by mouth 2 (two) times daily.  Marland Kitchen lisinopril (PRINIVIL,ZESTRIL) 2.5 MG tablet Take 2.5 mg by mouth daily.  . methocarbamol (ROBAXIN) 500 MG tablet Can take up to 1-2 tabs every 6 hours PRN PAIN (Patient taking differently: Take 500-1,000 mg by mouth every 6 (six) hours as needed for muscle spasms. )  . omeprazole (PRILOSEC) 20 MG capsule Take 1 capsule (20 mg total) by mouth daily.  Marland Kitchen oxyCODONE-acetaminophen (PERCOCET) 5-325 MG tablet Take 1 tablet by mouth every 4 (four)  hours as needed. (Patient taking differently: Take 1 tablet by mouth every 4 (four) hours as needed for severe pain. )  . SitaGLIPtin-MetFORMIN HCl 50-1000 MG TB24 Take 1 tablet by mouth every morning.  . TESTIM 50 MG/5GM (1%) GEL APPLY 1 PACKET ONTO THE SKIN EACH MORNING TO SHOULDER AND UPPER ARM ALLOW TO DRY 5 MIN BEFORE DRESSI    Allergies: Allergies as of 10/25/2016  . (No Known Allergies)   Past Medical History:  Diagnosis Date  . Diabetes mellitus without complication ([place])   . Hyperlipemia   . Hypertension     Family History:  Mother and father with DM No  neurologic disorders including MS No malignancies  Social History:  Drinks 12 beers/day Smokes 1 ppd for many years No other drugs Lives with wife, married for 3 years  Review of Systems: A complete ROS was negative except as per HPI.  Physical Exam: Blood pressure (!) 119/39, pulse 79, temperature 98.6 F (37 C), temperature source Oral, resp. rate 16, SpO2 98 %.  Physical Exam  Constitutional:  Overweight man lying in bed scratching at his head with his right hand through EEG cap  HENT:  Mouth/Throat: Oropharynx is clear and moist.  EEG scalp electrodes in place  Eyes: Conjunctivae are normal. No scleral icterus.  Neck: Normal range of motion. Neck supple.  Cardiovascular: Normal rate and regular rhythm.   Pulmonary/Chest: Effort normal and breath sounds normal.  Abdominal: Soft. He exhibits no distension. There is no tenderness.  Musculoskeletal: He exhibits no edema or tenderness.  Neurological:  Reflex Scores:      Bicep reflexes are 2+ on the right side and 3+ on the left side.      Brachioradialis reflexes are 2+ on the right side and 3+ on the left side.      Patellar reflexes are 2+ on the right side and 3+ on the left side. Alert, oriented to self.  Thinks he's at [facility].  Knows the president.  When asked today's date after his birthdate, continued to repeat his birthdate. Speech content impoverished [place] and perseveration present. L facial droop R gaze preference  CN VFF to confrontation Unable to assess visual neglect due to lack of cooperation EOMI intact PERRL Normal phonation Unable to assess XI due to lack of cooperation  Strength 5/5 R shoulder extension, elbow flexion and extension, hip flexion, knee flexion and extension, dorsiflexion, and plantar flexion 4/5 L shoulder extension, elbow flexion and extension, hip flexion, knee flexion and extension, dorsiflexion, and plantar flexion  Finger to nose intact bilaterally Unable to perform  rapid alternating movement bilaterally secondary to attention  Object naming intact Able to spell WORLD forward; continued spelling forward when asked to spell backwards 3/3 immediate recall, 0/3 delayed recall  Skin: Skin is warm and dry.     CBC Latest Ref Rng & Units 10/22/2016 10/20/2016 10/20/2016  WBC 4.0 - 10.5 K/uL 5.7 - 7.7  Hemoglobin 13.0 - 17.0 g/dL 16.3 16.3 16.5  Hematocrit 39.0 - 52.0 % 45.7 48.0 45.6  Platelets 150 - 400 K/uL 252 - 264   BMP Latest Ref Rng & Units 10/03/2016 10/20/2016 10/20/2016  Glucose 65 - 99 mg/dL 292(H) 209(H) 214(H)  BUN 6 - 20 mg/dL 17 16 14   Creatinine 0.61 - 1.24 mg/dL 0.87 0.80 0.81  Sodium 135 - 145 mmol/L 133(L) 135 134(L)  Potassium 3.5 - 5.1 mmol/L 4.6 4.6 4.4  Chloride 101 - 111 mmol/L 101 101 101  CO2 22 - 32  mmol/L 22 - 23  Calcium 8.9 - 10.3 mg/dL 9.7 - 9.7    CT Head without Contrast 10/20/2016 IMPRESSION: Focal hypodensity within the white matter of the right frontal lobe, this may represent an area of edema, either related to a mass or possible ischemia. MRI with and without contrast is recommended for further evaluation.  MRI Brain w/ and w/o Contrast 09/29/2016 IMPRESSION: 1. Area of diffusion restriction and hyperintense T2 weighted signal within the right frontal white matter. The distribution is not typical of ischemia and the appearance is concerning for a neoplastic process, such as a low grade glioma. Tumefactive demyelinating disease is also a consideration, though if the patient's symptoms are truly acute, some degree of contrast enhancement would be expected. Short-term follow-up with brain MRI with and without contrast in 4-6 weeks is recommended, as change over time may be helpful in diagnosis. Additionally, CSF sampling should be considered. 2. No hemorrhage or mass effect.  Assessment & Plan by Problem: Principal Problem:   Frontal mass of brain Active Problems:   Essential hypertension with goal  blood pressure less than 130/80   Type 2 diabetes mellitus with complication [facility])  Mr Elizalde is a relatively healthy 50 year old man with subacute behavioral changes and acute worsening neurologic status with newly discovered right frontal brain mass on imaging, most concerning for primary malignancy.  His acute deterioration is concerning for seizure, hemorrhage, or mass effect.  #Altered Mental Status #Right Frontal Brain Mass Most concerning for brain tumor (including GBM, astrocytoma, lymphoma).  Also tumefactive MS, less likely abscess, infarct. -Repeat head CT -F/u EEG results -8 mg dexamethasone Q12H -Will attend forthcoming neurology recommendations -Consider inpatient Neurosurg consult   #Alcohol Abuse Reports drinking 12 pack beer daily. -CIWA -PRN Ativan -Thiamine 100 mg daily  #HL -Continue atorvastatin  #GERD -Continue PPI  #Depression -Continue home fluoxetine 20 mg daily  #DM2 #HTN -Hold sitagliptin-metformin, lisinopril 2.5 mg daily -SSI  Dispo: Admit patient to Inpatient with expected length of stay greater than 2 midnights.  Signed: Minus Liberty, MD 10/20/2016, 11:00 AM  Pager: [phone]

## 2016-10-22 ENCOUNTER — Inpatient Hospital Stay (HOSPITAL_COMMUNITY): Payer: 59

## 2016-10-22 ENCOUNTER — Encounter (HOSPITAL_COMMUNITY): Payer: Self-pay | Admitting: Internal Medicine

## 2016-10-22 LAB — LIPID PANEL
CHOLESTEROL: 172 mg/dL (ref 0–200)
HDL: 51 mg/dL (ref >40)
LDL CALC: 90 mg/dL (ref 0–99)
TRIGLYCERIDES: 157 mg/dL — AB (ref <150)
Total CHOL/HDL Ratio: 3.4 RATIO
VLDL: 31 mg/dL (ref 0–40)

## 2016-10-22 LAB — CBC
HCT: 45.3 % (ref 39.0–52.0)
HEMOGLOBIN: 16.1 g/dL (ref 13.0–17.0)
MCH: 31 pg (ref 26.0–34.0)
MCHC: 35.5 g/dL (ref 30.0–36.0)
MCV: 87.1 fL (ref 78.0–100.0)
PLATELETS: 276 10*3/uL (ref 150–400)
RBC: 5.2 MIL/uL (ref 4.22–5.81)
RDW: 12.3 % (ref 11.5–15.5)
WBC: 10.4 10*3/uL (ref 4.0–10.5)

## 2016-10-22 LAB — BASIC METABOLIC PANEL
ANION GAP: 12 (ref 5–15)
BUN: 15 mg/dL (ref 6–20)
CHLORIDE: 101 mmol/L (ref 101–111)
CO2: 24 mmol/L (ref 22–32)
Calcium: 9.9 mg/dL (ref 8.9–10.3)
Creatinine, Ser: 0.81 mg/dL (ref 0.61–1.24)
GFR calc Af Amer: >60 mL/min (ref >60)
GLUCOSE: 222 mg/dL — AB (ref 65–99)
POTASSIUM: 4.4 mmol/L (ref 3.5–5.1)
SODIUM: 137 mmol/L (ref 135–145)

## 2016-10-22 LAB — GLUCOSE, CAPILLARY
GLUCOSE-CAPILLARY: 297 mg/dL — AB (ref 65–99)
GLUCOSE-CAPILLARY: 203 mg/dL — AB (ref 65–99)
Glucose-Capillary: 240 mg/dL — ABNORMAL HIGH (ref 65–99)
Glucose-Capillary: 232 mg/dL — ABNORMAL HIGH (ref 65–99)

## 2016-10-22 LAB — MAGNESIUM: Magnesium: 2 mg/dL (ref 1.7–2.4)

## 2016-10-22 LAB — MRSA PCR SCREENING: MRSA BY PCR: NEGATIVE

## 2016-10-22 MED ORDER — ASPIRIN 325 MG PO TABS: 325.0000 mg | ORAL_TABLET | Freq: Every day | ORAL | Status: DC

## 2016-10-22 MED ORDER — ATORVASTATIN CALCIUM 80 MG PO TABS: 80.0000 mg | ORAL_TABLET | Freq: Every day | ORAL | Status: DC

## 2016-10-22 MED ORDER — DEXAMETHASONE SODIUM PHOSPHATE 4 MG/ML IJ SOLN: 4.0000 mg | Freq: Once | INTRAMUSCULAR | Status: DC

## 2016-10-22 MED ORDER — IOPAMIDOL (ISOVUE-370) INJECTION 76%
INTRAVENOUS | Status: AC
  Administered 2016-10-22: 11:00:00
  Filled 2016-10-22: qty 50

## 2016-10-22 MED ORDER — SODIUM CHLORIDE 0.9 % IV SOLN
500.0000 mg | Freq: Two times a day (BID) | INTRAVENOUS | Status: DC
  Administered 2016-10-22: 500 mg via INTRAVENOUS
  Filled 2016-10-22 (×3): qty 5

## 2016-10-22 MED ORDER — FLUOXETINE HCL 20 MG PO CAPS: 20.0000 mg | ORAL_CAPSULE | Freq: Every morning | ORAL | Status: DC

## 2016-10-22 MED ORDER — CLOPIDOGREL BISULFATE 75 MG PO TABS: 75.0000 mg | ORAL_TABLET | Freq: Every day | ORAL | Status: DC

## 2016-10-22 MED ORDER — SODIUM CHLORIDE 0.9 % IV SOLN
INTRAVENOUS | Status: DC
  Administered 2016-10-22 – 2016-10-27 (×4): via INTRAVENOUS
  Filled 2016-10-22 (×4): qty 1000

## 2016-10-22 MED ORDER — DEXAMETHASONE SODIUM PHOSPHATE 4 MG/ML IJ SOLN
4.0000 mg | Freq: Once | INTRAMUSCULAR | Status: AC
  Administered 2016-10-23: 4 mg via INTRAVENOUS
  Filled 2016-10-22: qty 1

## 2016-10-22 MED ORDER — LORAZEPAM 2 MG/ML IJ SOLN: 1.0000 mg | Freq: Once | INTRAMUSCULAR | Status: DC | PRN

## 2016-10-22 MED ORDER — PNEUMOCOCCAL VAC POLYVALENT 25 MCG/0.5ML IJ INJ
0.5000 mL | INJECTION | INTRAMUSCULAR | Status: AC
  Administered 2016-10-24: 0.5 mL via INTRAMUSCULAR
  Filled 2016-10-22 (×2): qty 0.5

## 2016-10-22 MED ORDER — ASPIRIN 300 MG RE SUPP
300.0000 mg | Freq: Every day | RECTAL | Status: DC
  Administered 2016-10-22: 300 mg via RECTAL
  Filled 2016-10-22: qty 1

## 2016-10-22 MED ORDER — CLOPIDOGREL BISULFATE 75 MG PO TABS
75.0000 mg | ORAL_TABLET | Freq: Every day | ORAL | Status: DC
  Administered 2016-10-22 – 2016-10-27 (×5): 75 mg via NASOGASTRIC
  Filled 2016-10-22 (×6): qty 1

## 2016-10-22 MED ORDER — ASPIRIN 325 MG PO TABS
325.0000 mg | ORAL_TABLET | Freq: Every day | ORAL | Status: DC
  Administered 2016-10-24 – 2016-11-05 (×12): 325 mg via NASOGASTRIC
  Filled 2016-10-22 (×12): qty 1

## 2016-10-22 MED ORDER — DEXAMETHASONE SODIUM PHOSPHATE 4 MG/ML IJ SOLN: 4.0000 mg | Freq: Two times a day (BID) | INTRAMUSCULAR | Status: DC

## 2016-10-22 MED ORDER — DEXAMETHASONE SODIUM PHOSPHATE 4 MG/ML IJ SOLN: 2.0000 mg | Freq: Once | INTRAMUSCULAR | Status: DC

## 2016-10-22 MED ORDER — DEXTROSE-NACL 5-0.9 % IV SOLN
INTRAVENOUS | Status: DC
  Administered 2016-10-22: 12:00:00 via INTRAVENOUS
  Filled 2016-10-22 (×2): qty 1000

## 2016-10-22 MED ORDER — ATORVASTATIN CALCIUM 80 MG PO TABS
80.0000 mg | ORAL_TABLET | Freq: Every day | ORAL | Status: DC
  Administered 2016-10-22 – 2016-11-04 (×12): 80 mg via NASOGASTRIC
  Filled 2016-10-22 (×12): qty 1

## 2016-10-22 MED ORDER — LEVETIRACETAM 500 MG/5ML IV SOLN
1000.0000 mg | Freq: Two times a day (BID) | INTRAVENOUS | Status: DC
  Administered 2016-10-22 – 2016-10-23 (×3): 1000 mg via INTRAVENOUS
  Filled 2016-10-22 (×4): qty 10

## 2016-10-22 MED ORDER — INFLUENZA VAC SPLIT QUAD 0.5 ML IM SUSY
0.5000 mL | PREFILLED_SYRINGE | INTRAMUSCULAR | Status: AC
  Administered 2016-10-24: 0.5 mL via INTRAMUSCULAR
  Filled 2016-10-22 (×2): qty 0.5

## 2016-10-22 MED ORDER — LORAZEPAM 2 MG/ML IJ SOLN
1.0000 mg | Freq: Once | INTRAMUSCULAR | Status: AC
  Administered 2016-10-22: 1 mg via INTRAVENOUS
  Filled 2016-10-22: qty 1

## 2016-10-22 NOTE — Progress Notes (Signed)
Subjective: Scott Harris was less responsive overnight, sometimes opening his eyes at his wife's request but mostly sleeping.  Repeat MRI this morning showed new L frontal infarct, and emergent CTA head and neck rebeals  Objective:  Vital signs in last 24 hours: Vitals:   10/22/16 0300 10/22/16 0600 10/22/16 0720 10/22/16 1222  BP: (!) 171/94 (!) 184/103 (!) 167/91 (!) 170/97  Pulse: 80 80 76 88  Resp: (!) 23 (!) 21 20 19   Temp: 98.2 F (36.8 C)  98.4 F (36.9 C) 98.5 F (36.9 C)  TempSrc: Axillary  Oral Axillary  SpO2: 97% 93% 94% 95%  Weight:      Height:       Physical Exam  Constitutional:  Lying in bed with eyes closed and right arm on his forehead  HENT:  Head: Normocephalic and atraumatic.  Eyes:  Actively resists eye opening Pupils appear equal  Cardiovascular: Normal rate and regular rhythm.   Pulmonary/Chest: Effort normal and breath sounds normal.  Neurological:  GCS 10 [id number]) Would not participate in exam Opened eyes to wife's voice Moves all 4 extremities spontaneous and inconsistently on command   MRI Brain without Contrast 10/22/2016 IMPRESSION: 1. Emergent Anterior Circulation Large Vessel Occlusions - both Internal Carotid Arteries likely affected. Progressive bilateral anterior circulation infarcts, newly involving the left MCA/ACA territories. 2. No associated hemorrhage or mass effect at this time. 3. This was discussed by telephone with Dr. Elpidio Anis On 10/22/2016 at 1037 hours. We are arranging for the patient to undergo stat CTA head and neck at the time of this dictation.  CTA Head and Neck 10/22/2016  IMPRESSION: 1. Functional occlusion of both ICA siphons with suboptimal reconstitution of flow at both ICA termini:  - Radiographic string sign stenosis at the right ICA origin in the neck related to bulky low-density plaque or thrombus. The cervical Right ICA demonstrates thread-like enhancement and appears to occlude at the skullbase.  I suspect much of the Right ICA siphon is fully occluded.  - Tapered appearance of the cervical Left ICA without focal stenosis in the neck. There is thread-like enhancement in the Left ICA siphon, but I suspect the supraclinoid Left ICA segment is fully occluded. Thus the tapering in the neck is likely do to distal outflow obstruction.  - Reconstituted flow from the posterior communicating arteries, which unfortunately are diminutive.  - No ACA or MCA occlusion identified, but the anterior circulation appears diminutive throughout.  2. Preliminary report of the above was discussed with Dr. Elpidio Anis at 1138 hours. 3. Posterior circulation is largely normal. There does appear to be a moderate or severe atherosclerotic stenosis at the right vertebral artery origin, but no other posterior circulation stenosis. 4. Expected CT appearance of evolving bilateral anterior circulation infarcts. No associated hemorrhage or mass effect. 5. No other abnormality in the neck. 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  #Bilateral Frontal Strokes #Bilateral ICA Occlusion -Neurology following -Aspirin 325 mg daily -Plavix 75 mg daily -Atorvastatin 80 mg daily -Continue Keppra 1000  mg BID -Quickly taper off dexamethasone -Echo -Lipid panel -A1c  #HTN Permissive hypertension. -Hold lisinopril  #HL Increase statin therapy to high intensity with atorvastatin 80 mg  Dispo: Anticipated discharge contingent on clinical  course.  Minus Liberty, MD 10/22/2016, 1:01 PM Pager: [phone]

## 2016-10-22 NOTE — Progress Notes (Signed)
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   Objective: Current vital signs: BP (!) 167/91 (BP Location: Left Arm)   Pulse 76   Temp 98.4 F (36.9 C) (Oral)   Resp 20   Ht 5\' 8"  (1.727 m)   Wt 86.8 kg (191 lb 4.8 oz)   SpO2 94%   BMI 29.09 kg/m  Vital signs in last 24 hours: Temp:  [98.2 F (36.8 C)-98.7 F (37.1 C)] 98.4 F (36.9 C) (11/26 0720) Pulse Rate:  [76-106] 76 (11/26 0720) Resp:  [12-24] 20 (11/26 0720) BP: (110-196)/(75-124) 167/91 (11/26 0720) SpO2:  [92 %-97 %] 94 % (11/26 0720) Weight:  [86.8 kg (191 lb 4.8 oz)] 86.8 kg (191 lb 4.8 oz) (11/25 2025)   Neurological exam Currently encephalopathic prior to MRI patient also received Ativan. Pupils are equal and reactive but patient does not follow any commands he withdraws to painful stimuli in all 4 extremities right more than left. Face seems symmetrical.  Lab Results: Basic Metabolic Panel:  Recent Labs Lab 10/20/16 1322 10/20/16 1332 10/15/2016 0806 10/22/16 0404  NA 134* 135 133* 137  K 4.4 4.6 4.6 4.4  CL 101 101 101 101  CO2 23  --  22 24  GLUCOSE 214* 209* 292* 222*  BUN 14 16 17 15   CREATININE 0.81 0.80 0.87 0.81  CALCIUM 9.7  --  9.7 9.9  MG  --   --   --  2.0    Liver Function Tests:  Recent Labs Lab 10/20/16 1322  AST 42*  ALT 97*  ALKPHOS 78  BILITOT 0.7  PROT 8.1  ALBUMIN 4.9   No results for input(s): LIPASE, AMYLASE in the last 168 hours. No results for input(s): AMMONIA in the last 168 hours.  CBC:  Recent Labs Lab 10/20/16 1322 10/20/16 1332 10/03/2016 0806 10/22/16 0404  WBC 7.7   --  5.7 10.4  NEUTROABS 5.0  --  5.0  --   HGB 16.5 16.3 16.3 16.1  HCT 45.6 48.0 45.7 45.3  MCV 87.4  --  86.6 87.1  PLT 264  --  252 276    Cardiac Enzymes: No results for input(s): CKTOTAL, CKMB, CKMBINDEX, TROPONINI in the last 168 hours.  Lipid Panel: No results for input(s): CHOL, TRIG, HDL, CHOLHDL, VLDL, LDLCALC in the last 168 hours.  CBG:  Recent Labs Lab 10/20/16 1336 10/02/2016 1658 10/06/2016 2220 10/22/16 0807  GLUCAP 185* 240* 226* 297*    Microbiology: Results for orders placed or performed during the hospital encounter of 10/22/2016  MRSA PCR Screening     Status: None   Collection Time: 10/20/2016 11:42 PM  Result Value Ref Range Status   MRSA by PCR NEGATIVE NEGATIVE Final    Comment:        The GeneXpert MRSA Assay (FDA approved for NASAL specimens only), is one component of a comprehensive MRSA colonization surveillance program. It is not intended to diagnose MRSA infection nor to guide or monitor treatment for MRSA infections.     Coagulation Studies:  Recent Labs  10/20/16 1322  LABPROT 12.0  INR 0.89    Imaging: Ct Head Wo Contrast  Result Date: 10/08/2016 CLINICAL DATA:  50 year old male with seizure, confusion. Abnormal brain MRI yesterday. Subsequent encounter. EXAM: CT HEAD WITHOUT CONTRAST TECHNIQUE: Contiguous axial images were obtained from the base of the skull through the vertex without intravenous contrast. COMPARISON:  10/20/2016 brain MRI and head CT. FINDINGS: Brain: Stable appearance of the oval 7 cm area of abnormal white matter hypodensity in the anterior right frontal lobe. There was corresponding abnormal signal in the white matter here on MRI but without associated enhancement. No associated hemorrhage, and as before there is no regional mass effect. Gray and white-matter differentiation elsewhere remains normal. No ventriculomegaly.  No acute or chronic cortically based infarct. Vascular: Mild Calcified atherosclerosis at  the skull base. No suspicious intracranial vascular hyperdensity. Skull: No osseous abnormality identified. Sinuses/Orbits: Mild right sphenoid sinus mucous retention cyst is stable. Other Visualized paranasal sinuses and mastoids are stable and well pneumatized. Other: Negative orbit and scalp soft tissues. IMPRESSION: Unchanged CT appearance of the brain since yesterday. Stable anterior right frontal lobe white matter lesion without mass effect or hemorrhage which was characterized on MRI without and with contrast yesterday, please see also that report. Electronically Signed   By: Genevie Ann M.D.   On: 10/01/2016 12:53   Ct Head Wo Contrast  Result Date: 10/20/2016 CLINICAL DATA:  Tongue numbness and left facial droop, numbness and tingling in the hands and fingers. EXAM: CT HEAD WITHOUT CONTRAST TECHNIQUE: Contiguous axial images were obtained from the base of the skull through the vertex without intravenous contrast. COMPARISON:  None. FINDINGS: Brain: There is nail hemorrhage identified. There is a moderate-sized focal hypodensity within the right frontal lobe white matter, this measures approximately 5 cm AP on sagittal images by 1.7 cm cranial caudad. No midline shift. The ventricles are nonenlarged. Vascular: No hyperdense vessels.  No unexpected calcifications. Skull: Mastoid air cells are clear. There is no skull fracture identified. Sinuses/Orbits: Minimal mucosal thickening in the ethmoid and right sphenoid sinus. No acute orbital abnormality. Other: None IMPRESSION: Focal hypodensity within the white matter of the right frontal lobe, this may represent an area of edema, either related to a mass or possible ischemia. MRI with and without contrast is recommended for further evaluation. Electronically Signed   By: Donavan Foil M.D.   On: 10/20/2016 14:20   Mr Brain Wo Contrast  Result Date: 10/22/2016 CLINICAL DATA:  50 year old male presented to the [facility] ED yesterday with 2 weeks of  increasing confusion, gait disturbance, and parasthesias. Head CT and brain MRI revealed a large, right frontal white matter lesion without mass effect. He was prescribed 8 mg dexamethasone BID, Keppra, 500 mg BID, and discharged since his neurologic status was grossly normal and he was in no acute distress. Overnight after leaving the the ED he symptoms worsened with slurred speech, confusion, lethargy, and intermittent flailing movements of his left arm. His condition has rapidly deteriorated; at Thanksgiving, he was less talkative then normal, seemed somewhat distracted, and was slurring his speech slightly. He was totally normal 3 weeks ago, with insidious and progressive symptoms until rapid changes in last 2 days. Subsequent encounter. EXAM: MRI HEAD WITHOUT CONTRAST TECHNIQUE: Multiplanar, multiecho pulse sequences of the brain and surrounding structures were obtained without intravenous contrast. COMPARISON:  Head CT without contrast 10/07/2016, brain MRI without and with contrast 10/20/2016. FINDINGS: Brain: New gray and white matter restricted diffusion in the anterior left frontal lobe, involving left MCA/ ACA watershed territory. Small foci of restricted diffusion in the left basal ganglia. Mildly enlarged area of anterior  right frontal lobe white matter restricted diffusion which was initially present on 10/20/2016. Occasional other small scattered cortically based areas of restricted diffusion in the right superior frontal gyrus. Associated increased T2 and FLAIR hyperintensity, in the anterior left frontal lobe most resembling cytotoxic edema. No evidence of associated hemorrhage. No significant intracranial mass effect. No thalamic or posterior posterior circulation restricted diffusion. Basilar cisterns remain patent. No ventriculomegaly. Signal in the right basal ganglia, thalami, brainstem, and cerebellum remains normal. Vascular: Development of abnormal bilateral ICA flow voids, both are lost in  the distal neck and through the ICA siphons but with evidence of some reconstituted flow at the MCA and ACA origins. The vertebrobasilar flow voids remain normal. Skull and upper cervical spine: Negative. Normal bone marrow signal. Sinuses/Orbits: Stable. Other: Stable scalp soft tissues. IMPRESSION: 1. Emergent Anterior Circulation Large Vessel Occlusions - both Internal Carotid Arteries likely affected. Progressive bilateral anterior circulation infarcts, newly involving the left MCA/ACA territories. 2. No associated hemorrhage or mass effect at this time. 3. This was discussed by telephone with Dr. Elpidio Anis On 10/22/2016 at 1037 hours. We are arranging for the patient to undergo stat CTA head and neck at the time of this dictation. Electronically Signed   By: Genevie Ann M.D.   On: 10/22/2016 10:43   Mr Jeri Cos [id number] Contrast  Result Date: 10/20/2016 CLINICAL DATA:  Left facial droop.  Tingling and numbness. EXAM: MRI HEAD WITHOUT AND WITH CONTRAST TECHNIQUE: Multiplanar, multiecho pulse sequences of the brain and surrounding structures were obtained without and with intravenous contrast. CONTRAST:  100mL MULTIHANCE GADOBENATE DIMEGLUMINE 529 MG/ML IV SOLN COMPARISON:  Head CT 10/20/2016 FINDINGS: Brain: There is a focal area of diffusion restriction and hyperintense T2 weighted signal within the white matter of the right frontal lobe, measuring approximately 3.5 x 1.6 cm. There is no associated contrast enhancement.There is no associated midline shift or mass effect. No other focal lesions are identified. There is no evidence of intracranial hemorrhage. Vascular: The major intracranial flow voids are preserved. Skull and upper cervical spine: Calvarium and visualized upper cervical spine are normal. Sinuses/Orbits: Normal sinuses and orbits. Other: None IMPRESSION: 1. Area of diffusion restriction and hyperintense T2 weighted signal within the right frontal white matter. The distribution is not typical of ischemia  and the appearance is concerning for a neoplastic process, such as a low grade glioma. Tumefactive demyelinating disease is also a consideration, though if the patient's symptoms are truly acute, some degree of contrast enhancement would be expected. Short-term follow-up with brain MRI with and without contrast in 4-6 weeks is recommended, as change over time may be helpful in diagnosis. Additionally, CSF sampling should be considered. 2. No hemorrhage or mass effect. Electronically Signed   By: Ulyses Jarred M.D.   On: 10/20/2016 17:12   Mr Cervical Spine Wo Contrast  Result Date: 10/22/2016 CLINICAL DATA:  50 year old male with deteriorating neurologic status following initial presentation on 10/20/2016 with right frontal lobe white matter lesion. Initial encounter. EXAM: MRI CERVICAL SPINE WITHOUT CONTRAST TECHNIQUE: Multiplanar, multisequence MR imaging of the cervical spine was performed. No intravenous contrast was administered. COMPARISON:  Brain MRI without contrast today reported separately. FINDINGS: Study is intermittently degraded by motion artifact despite repeated imaging attempts. Alignment: Mild straightening of cervical lordosis. Vertebrae: No marrow edema or evidence of acute osseous abnormality. Cord: Spinal cord signal is within normal limits at all visualized levels. Posterior Fossa, vertebral arteries, paraspinal tissues: Cervicomedullary junction is within normal limits. Negative visualized brain  parenchyma. While the vertebral artery flow voids appear preserved throughout the neck, the carotid flow voids seem to be absent distal to the carotid bifurcations. Disc levels: No cervical spinal stenosis. Mild for age cervical degeneration. There is more mild to moderate facet and uncovertebral hypertrophy in the cervical spine resulting in mild or moderate neural foraminal stenosis at the bilateral C4, left greater than right C5, and bilateral C6 nerve levels. IMPRESSION: 1. Abnormal brain MRI  today reported separately. 2. Mild for age cervical spine degeneration. No cervical spinal stenosis or spinal cord abnormality. Mild or up to moderate degenerative cervical foraminal stenosis. Electronically Signed   By: Genevie Ann M.D.   On: 10/22/2016 11:04    Medications:  Scheduled: . [START ON 10/23/2016] aspirin  325 mg Oral Daily  . atorvastatin  80 mg Oral q1800  . clopidogrel  75 mg Oral Daily  . dexamethasone  8 mg Intravenous Q12H  . FLUoxetine  20 mg Oral q morning - 10a  . [START ON 10/23/2016] Influenza vac split quadrivalent PF  0.5 mL Intramuscular Tomorrow-1000  . insulin aspart  0-5 Units Subcutaneous QHS  . insulin aspart  0-9 Units Subcutaneous TID WC  . levETIRAcetam  1,000 mg Intravenous BID  . LORazepam  0-4 mg Intravenous Q6H   Followed by  . [START ON 10/23/2016] LORazepam  0-4 mg Intravenous Q12H  . pantoprazole  40 mg Oral Daily  . [START ON 10/23/2016] pneumococcal 23 valent vaccine  0.5 mL Intramuscular Tomorrow-1000  . thiamine  100 mg Oral Daily   Or  . 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 Official results for MRI and CTA head and neck are pending. Right ICA probably 100% occluded, left ICA is 99% occluded. We will put NG tube and start patient on aspirin and Plavix and high-dose statin therapy. Taper off steroids, continue Keppra, will consider tapering off Keppra at some point. Start patient on continuous IV fluids aim to keep her systolic blood pressure between 140-160 Echocardiogram, lipid panel, hemoglobin A1c Findings discussed in detail with patient's family         Etta Quill PA-C [facility] [phone]  10/22/2016, 11:56 AM

## 2016-10-23 ENCOUNTER — Inpatient Hospital Stay (HOSPITAL_COMMUNITY): Payer: 59

## 2016-10-23 ENCOUNTER — Encounter (HOSPITAL_COMMUNITY): Payer: Self-pay | Admitting: Radiology

## 2016-10-23 DIAGNOSIS — Z978 Presence of other specified devices: Secondary | ICD-10-CM

## 2016-10-23 DIAGNOSIS — I6330 Cerebral infarction due to thrombosis of unspecified cerebral artery: Secondary | ICD-10-CM | POA: Insufficient documentation

## 2016-10-23 DIAGNOSIS — I6523 Occlusion and stenosis of bilateral carotid arteries: Secondary | ICD-10-CM

## 2016-10-23 HISTORY — PX: IR GENERIC HISTORICAL: IMG1180011

## 2016-10-23 LAB — HEMOGLOBIN A1C
Hgb A1c MFr Bld: 8.5 % — ABNORMAL HIGH (ref 4.8–5.6)
MEAN PLASMA GLUCOSE: 197 mg/dL

## 2016-10-23 LAB — GLUCOSE, CAPILLARY
GLUCOSE-CAPILLARY: 272 mg/dL — AB (ref 65–99)
GLUCOSE-CAPILLARY: 220 mg/dL — AB (ref 65–99)
GLUCOSE-CAPILLARY: 217 mg/dL — AB (ref 65–99)
Glucose-Capillary: 263 mg/dL — ABNORMAL HIGH (ref 65–99)
Glucose-Capillary: 239 mg/dL — ABNORMAL HIGH (ref 65–99)
Glucose-Capillary: 214 mg/dL — ABNORMAL HIGH (ref 65–99)

## 2016-10-23 MED ORDER — LABETALOL HCL 5 MG/ML IV SOLN
INTRAVENOUS | Status: AC
  Filled 2016-10-23: qty 4

## 2016-10-23 MED ORDER — JEVITY 1.2 CAL PO LIQD: 1000.0000 mL | ORAL | Status: DC

## 2016-10-23 MED ORDER — LIDOCAINE HCL 1 % IJ SOLN
INTRAMUSCULAR | Status: AC
  Filled 2016-10-23: qty 20

## 2016-10-23 MED ORDER — VALPROATE SODIUM 500 MG/5ML IV SOLN
500.0000 mg | Freq: Three times a day (TID) | INTRAVENOUS | Status: DC
  Administered 2016-10-23 – 2016-10-26 (×8): 500 mg via INTRAVENOUS
  Filled 2016-10-23 (×14): qty 5

## 2016-10-23 MED ORDER — IOPAMIDOL (ISOVUE-300) INJECTION 61%
INTRAVENOUS | Status: AC
  Administered 2016-10-23: 100 mL
  Filled 2016-10-23: qty 150

## 2016-10-23 MED ORDER — SODIUM CHLORIDE 0.9 % IV SOLN
500.0000 mg | Freq: Two times a day (BID) | INTRAVENOUS | Status: DC
  Filled 2016-10-23: qty 5

## 2016-10-23 MED ORDER — LABETALOL HCL 5 MG/ML IV SOLN
INTRAVENOUS | Status: AC | PRN
  Administered 2016-10-23: 5 mg via INTRAVENOUS

## 2016-10-23 MED ORDER — FENTANYL CITRATE (PF) 100 MCG/2ML IJ SOLN
INTRAMUSCULAR | Status: AC
  Filled 2016-10-23: qty 2

## 2016-10-23 MED ORDER — MIDAZOLAM HCL 2 MG/2ML IJ SOLN
INTRAMUSCULAR | Status: AC
  Filled 2016-10-23: qty 2

## 2016-10-23 MED ORDER — SODIUM CHLORIDE 0.9 % IV SOLN: INTRAVENOUS | Status: AC

## 2016-10-23 MED ORDER — FENTANYL CITRATE (PF) 100 MCG/2ML IJ SOLN
INTRAMUSCULAR | Status: AC | PRN
  Administered 2016-10-23: 25 ug via INTRAVENOUS

## 2016-10-23 MED ORDER — DEXAMETHASONE SODIUM PHOSPHATE 4 MG/ML IJ SOLN: 2.0000 mg | Freq: Two times a day (BID) | INTRAMUSCULAR | Status: DC

## 2016-10-23 MED ORDER — VALPROATE SODIUM 500 MG/5ML IV SOLN
1000.0000 mg | Freq: Once | INTRAVENOUS | Status: AC
  Administered 2016-10-23: 1000 mg via INTRAVENOUS
  Filled 2016-10-23: qty 10

## 2016-10-23 MED ORDER — HYDRALAZINE HCL 20 MG/ML IJ SOLN
INTRAMUSCULAR | Status: AC
Start: 2016-10-23 — End: 2016-10-24
  Filled 2016-10-23: qty 1

## 2016-10-23 MED ORDER — LIDOCAINE HCL 1 % IJ SOLN
INTRAMUSCULAR | Status: AC | PRN
  Administered 2016-10-23: 10 mL

## 2016-10-23 MED ORDER — GLUCERNA 1.2 CAL PO LIQD
1000.0000 mL | ORAL | Status: DC
  Administered 2016-10-23 – 2016-11-05 (×14): 1000 mL
  Filled 2016-10-23 (×29): qty 1000

## 2016-10-23 MED ORDER — SODIUM CHLORIDE 0.9 % IV SOLN: INTRAVENOUS | Status: DC

## 2016-10-23 MED ORDER — HYDRALAZINE HCL 20 MG/ML IJ SOLN
INTRAMUSCULAR | Status: AC | PRN
  Administered 2016-10-23 (×3): 5 mg via INTRAVENOUS

## 2016-10-23 MED ORDER — INSULIN ASPART 100 UNIT/ML ~~LOC~~ SOLN
0.0000 [IU] | SUBCUTANEOUS | Status: DC
  Administered 2016-10-23 – 2016-10-24 (×5): 3 [IU] via SUBCUTANEOUS
  Administered 2016-10-24: 2 [IU] via SUBCUTANEOUS
  Administered 2016-10-24 – 2016-10-25 (×5): 3 [IU] via SUBCUTANEOUS
  Administered 2016-10-25: 2 [IU] via SUBCUTANEOUS
  Administered 2016-10-25 – 2016-10-26 (×8): 3 [IU] via SUBCUTANEOUS
  Administered 2016-10-27: 5 [IU] via SUBCUTANEOUS
  Administered 2016-10-27: 3 [IU] via SUBCUTANEOUS
  Administered 2016-10-27: 5 [IU] via SUBCUTANEOUS
  Administered 2016-10-27: 3 [IU] via SUBCUTANEOUS

## 2016-10-23 MED ORDER — MIDAZOLAM HCL 2 MG/2ML IJ SOLN
INTRAMUSCULAR | Status: AC | PRN
  Administered 2016-10-23: 1 mg via INTRAVENOUS

## 2016-10-23 NOTE — Progress Notes (Signed)
vLTM EEG running. No skin breakdown/ event button tested. Notified neuro

## 2016-10-23 NOTE — Sedation Documentation (Signed)
Patient is resting comfortably. 

## 2016-10-23 NOTE — Sedation Documentation (Signed)
Dr Estanislado Pandy at bedside to speak with pt wife and mother

## 2016-10-23 NOTE — Progress Notes (Signed)
Inpatient Diabetes Program Recommendations  AACE/ADA: New Consensus Statement on Inpatient Glycemic Control (2015)  Target Ranges:  Prepandial:   less than 140 mg/dL      Peak postprandial:   less than 180 mg/dL (1-2 hours)      Critically ill patients:  140 - 180 mg/dL   Lab Results  Component Value Date   GLUCAP 272 (H) 10/23/2016   HGBA1C 8.5 (H) 10/22/2016    Review of Glycemic Control Results for Vandenboom, Brach (MRN [id number]) as of 10/23/2016 11:14  Ref. Range 10/22/2016 08:07 10/22/2016 11:57 10/22/2016 16:45 10/22/2016 21:18 10/23/2016 08:18  Glucose-Capillary Latest Ref Range: 65 - 99 mg/dL 297 (H) 240 (H) 232 (H) 203 (H) 272 (H)   Diabetes history: DM2 Outpatient Diabetes medications: Sitagliptin-Met 50- 1 gm qd Current orders for Inpatient glycemic control: Novolog correction 0-9 units tid + 0-5 units hs  Inpatient Diabetes Program Recommendations:  While oral DM medications on hold, please consider: -Lantus 17 units (0.2 units/kg)  Noted pending A1c.  Thank you,  E. , RN, MSN, CDE Inpatient Glycemic Control Team Team Pager #[phone] (8am-5pm) 10/23/2016 11:17 AM     

## 2016-10-23 NOTE — Sedation Documentation (Signed)
Pt is resting.

## 2016-10-23 NOTE — Procedures (Signed)
S/P 4 vessel cerebral  Arteriogram.. Rt CFA approach. Findings. 1. 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 3.Approx 80 to 85 % stenosis of RT VA at origin. 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

## 2016-10-23 NOTE — Sedation Documentation (Signed)
Pt is not responsive to voice at this time. This is pt baseline upon arrival

## 2016-10-23 NOTE — Progress Notes (Addendum)
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 Patient was not considered for IV t-PA. Due to late presentation and stroke not initially suspected   SUBJECTIVE (INTERVAL HISTORY) His wife is at the bedside.  He has received ativan and is very lethargic. Not opening eyes.  [place]. Wife admits that patient is a heavy alcohol drinker and had a fall 2 weeks ago to go on her daughters birthday and bruised his back and had hit his forehead. He was seen in urgent care but did not get a brain scan.   OBJECTIVE Temp:  [98 F (36.7 C)-98.9 F (37.2 C)] 98.9 F (37.2 C) (11/27 0800) Pulse Rate:  [82-97] 97 (11/27 0800) Cardiac Rhythm: Normal sinus rhythm (11/27 0853) Resp:  [19-25] 19 (11/27 0800) BP: (161-187)/(97-106) 187/103 (11/27 0800) SpO2:  [90 %-95 %] 93 % (11/27 0800)  CBC:  Recent Labs Lab 10/20/16 1322  10/01/2016 0806 10/22/16 0404  WBC 7.7  --  5.7 10.4  NEUTROABS 5.0  --  5.0  --   HGB 16.5  < > 16.3 16.1  HCT 45.6  < > 45.7 45.3  MCV 87.4  --  86.6 87.1  PLT 264  --  252 276  < > = values in this interval not displayed.  Basic Metabolic Panel:   Recent Labs Lab 10/23/2016 0806 10/22/16 0404  NA 133* 137  K 4.6 4.4  CL 101 101  CO2 22 24  GLUCOSE 292* 222*  BUN 17 15  CREATININE 0.87 0.81  CALCIUM 9.7 9.9  MG  --  2.0    Lipid Panel:     Component Value Date/Time   CHOL 172 10/22/2016 1425   TRIG 157 (H) 10/22/2016 1425   HDL 51 10/22/2016 1425   CHOLHDL 3.4 10/22/2016 1425   VLDL 31 10/22/2016 1425   LDLCALC 90 10/22/2016 1425   HgbA1c:  No results found for: HGBA1C Urine Drug Screen:     Component Value Date/Time   LABOPIA NONE DETECTED 10/20/2016 1437   COCAINSCRNUR NONE DETECTED 10/20/2016 1437   LABBENZ NONE DETECTED 10/20/2016 1437   AMPHETMU NONE DETECTED 10/20/2016 1437   THCU NONE DETECTED 10/20/2016 1437   LABBARB NONE DETECTED 10/20/2016 1437      IMAGING  Ct Angio Head W Or Wo Contrast  Result Date: 10/22/2016 CLINICAL DATA:  50 year old male with evidence of bilateral ICA poor flow or occlusion on brain MRI today. Acute anterior circulation infarcts. Initial encounter. EXAM: CT ANGIOGRAPHY HEAD AND NECK TECHNIQUE: Multidetector CT imaging of the head and neck was performed using the standard protocol during bolus administration of intravenous contrast. Multiplanar CT image reconstructions and MIPs were obtained to evaluate the vascular anatomy. Carotid stenosis measurements (when applicable) are obtained utilizing NASCET criteria, using the distal internal carotid diameter as the denominator. CONTRAST:  50 mL Isovue 370 COMPARISON:  Brain MRI 0941 hours today, and earlier. FINDINGS: CTA NECK Skeleton: Largely unremarkable. No acute osseous abnormality identified. Visualized paranasal sinuses and mastoids are stable and well pneumatized. Upper chest: Mild paraseptal emphysema. Mild dependent pulmonary atelectasis. Incidental azygos fissure. No superior mediastinal lymphadenopathy. Other neck: Negative thyroid. Larynx, pharynx, parapharyngeal  spaces, retropharyngeal space, sublingual space, submandibular glands and parotid glands are within normal limits. Visualized orbits and scalp soft tissues are within normal limits. No cervical lymphadenopathy. Aortic arch: 3 vessel arch configuration. Minimal soft plaque in the distal arch. Right carotid system: Normal brachiocephalic and right CCA artery origins. There is low-density soft plaque along the anterior right CCA at the level of the larynx (series 401, image 63) which is  not hemodynamically significant. At the right carotid bifurcation there is combined soft and calcified plaque affecting the right ICA origin and bulb, and a radiographic string sign stenosis results throughout the proximal right ICA (series 404, image 82). Thread-like right ICA enhancement continues distally, but the vessel appears to occlude at the level of the skullbase (series 401, image 121). See intracranial findings below. Left carotid system: Negative left CCA origin. No significant plaque in the left CCA. At the left carotid bifurcation there is calcified plaque at the posterior left ICA origin which is not hemodynamically significant. However, distal to the origin the left ICA slowly tapers resulting in a vessel about 1/2 of its normal size below the skullbase (series 406, image 27). There is no focal high-grade stenosis. The vessel remains patent to the skullbase, see below. Vertebral arteries:No proximal left subclavian artery stenosis despite soft and calcified plaque. Normal proximal right subclavian. Normal left vertebral artery origin. There is soft plaque at the right vertebral artery origin resulting in at least moderate stenosis (series 401, image 39 and series 403, image 143). There is mild calcified plaque in the right V2 segment. No other right vertebral artery stenosis to the skullbase. No left vertebral artery stenosis to the skullbase. CTA HEAD Posterior circulation: Both distal vertebral arteries are patent without stenosis. Normal vertebrobasilar junction. Normal right PICA origin. Dominant appearing left AICA origin. No basilar stenosis. Normal SCA and PCA origins. Both posterior communicating arteries are present, but are diminutive especially the left. Bilateral PCA branches are normal. Anterior circulation: Right ICA siphon is occluded until flow is reconstituted at the level of the right posterior communicating artery origin. There calcified plaque in the occluded left ICA just  proximal to the reconstitution. The right ICA terminus is patent but irregular. There is faint thread-like enhancement of the left ICA in the petrous segment. Little if any enhancement of the left siphon in the cavernous and proximal supraclinoid segments. Flow is reconstituted at the level of the left posterior communicating artery origin. The left ICA terminus is patent but irregular. The left ACA A1 segment appears to be dominant. The right is diminutive. The anterior communicating artery and bilateral ACA branches are patent, but somewhat diminutive. Both MCA M1 segments are patent but diminutive. The bilateral MCA bifurcations are patent. No MCA M2 branch occlusion is identified. The bilateral MCA branches are patent but somewhat diminutive. Venous sinuses: Patent on delayed images. Anatomic variants: Dominant left and diminutive right ACA A1 segments. Delayed phase: Evolving cytotoxic edema in the anterior left frontal lobe, MCA/ACA watershed areas. Progressed right frontal lobe white matter hypodensity corresponding to the more subacute infarct. No abnormal enhancement identified. No acute intracranial hemorrhage identified. No midline shift, mass effect, or evidence of intracranial mass lesion. Review of the MIP images confirms the above findings IMPRESSION: 1. Functional occlusion of both ICA siphons with suboptimal reconstitution of flow at both ICA termini: - Radiographic string sign stenosis at the right ICA origin in the neck related to bulky low-density plaque or thrombus. The cervical Right ICA demonstrates thread-like enhancement and  appears to occlude at the skullbase. I suspect much of the Right ICA siphon is fully occluded. - Tapered appearance of the cervical Left ICA without focal stenosis in the neck. There is thread-like enhancement in the Left ICA siphon, but I suspect the supraclinoid Left ICA segment is fully occluded. Thus the tapering in the neck is likely do to distal outflow  obstruction. - Reconstituted flow from the posterior communicating arteries, which unfortunately are diminutive. - No ACA or MCA occlusion identified, but the anterior circulation appears diminutive throughout. 2. Preliminary report of the above was discussed with Dr. Elpidio Anis at 1138 hours. 3. Posterior circulation is largely normal. There does appear to be a moderate or severe atherosclerotic stenosis at the right vertebral artery origin, but no other posterior circulation stenosis. 4. Expected CT appearance of evolving bilateral anterior circulation infarcts. No associated hemorrhage or mass effect. 5. No other abnormality in the neck. Mild emphysema in the lung apices. Electronically Signed   By: Genevie Ann M.D.   On: 10/22/2016 12:24   Ct Head Wo Contrast  Result Date: 10/01/2016 CLINICAL DATA:  50 year old male with seizure, confusion. Abnormal brain MRI yesterday. Subsequent encounter. EXAM: CT HEAD WITHOUT CONTRAST TECHNIQUE: Contiguous axial images were obtained from the base of the skull through the vertex without intravenous contrast. COMPARISON:  10/20/2016 brain MRI and head CT. FINDINGS: Brain: Stable appearance of the oval 7 cm area of abnormal white matter hypodensity in the anterior right frontal lobe. There was corresponding abnormal signal in the white matter here on MRI but without associated enhancement. No associated hemorrhage, and as before there is no regional mass effect. Gray and white-matter differentiation elsewhere remains normal. No ventriculomegaly.  No acute or chronic cortically based infarct. Vascular: Mild Calcified atherosclerosis at the skull base. No suspicious intracranial vascular hyperdensity. Skull: No osseous abnormality identified. Sinuses/Orbits: Mild right sphenoid sinus mucous retention cyst is stable. Other Visualized paranasal sinuses and mastoids are stable and well pneumatized. Other: Negative orbit and scalp soft tissues. IMPRESSION: Unchanged CT appearance of  the brain since yesterday. Stable anterior right frontal lobe white matter lesion without mass effect or hemorrhage which was characterized on MRI without and with contrast yesterday, please see also that report. Electronically Signed   By: Genevie Ann M.D.   On: 10/08/2016 12:53   Ct Angio Neck W Or Wo Contrast  Result Date: 10/22/2016 CLINICAL DATA:  50 year old male with evidence of bilateral ICA poor flow or occlusion on brain MRI today. Acute anterior circulation infarcts. Initial encounter. EXAM: CT ANGIOGRAPHY HEAD AND NECK TECHNIQUE: Multidetector CT imaging of the head and neck was performed using the standard protocol during bolus administration of intravenous contrast. Multiplanar CT image reconstructions and MIPs were obtained to evaluate the vascular anatomy. Carotid stenosis measurements (when applicable) are obtained utilizing NASCET criteria, using the distal internal carotid diameter as the denominator. CONTRAST:  50 mL Isovue 370 COMPARISON:  Brain MRI 0941 hours today, and earlier. FINDINGS: CTA NECK Skeleton: Largely unremarkable. No acute osseous abnormality identified. Visualized paranasal sinuses and mastoids are stable and well pneumatized. Upper chest: Mild paraseptal emphysema. Mild dependent pulmonary atelectasis. Incidental azygos fissure. No superior mediastinal lymphadenopathy. Other neck: Negative thyroid. Larynx, pharynx, parapharyngeal spaces, retropharyngeal space, sublingual space, submandibular glands and parotid glands are within normal limits. Visualized orbits and scalp soft tissues are within normal limits. No cervical lymphadenopathy. Aortic arch: 3 vessel arch configuration. Minimal soft plaque in the distal arch. Right carotid system: Normal brachiocephalic and right CCA  artery origins. There is low-density soft plaque along the anterior right CCA at the level of the larynx (series 401, image 63) which is not hemodynamically significant. At the right carotid bifurcation  there is combined soft and calcified plaque affecting the right ICA origin and bulb, and a radiographic string sign stenosis results throughout the proximal right ICA (series 404, image 82). Thread-like right ICA enhancement continues distally, but the vessel appears to occlude at the level of the skullbase (series 401, image 121). See intracranial findings below. Left carotid system: Negative left CCA origin. No significant plaque in the left CCA. At the left carotid bifurcation there is calcified plaque at the posterior left ICA origin which is not hemodynamically significant. However, distal to the origin the left ICA slowly tapers resulting in a vessel about 1/2 of its normal size below the skullbase (series 406, image 27). There is no focal high-grade stenosis. The vessel remains patent to the skullbase, see below. Vertebral arteries:No proximal left subclavian artery stenosis despite soft and calcified plaque. Normal proximal right subclavian. Normal left vertebral artery origin. There is soft plaque at the right vertebral artery origin resulting in at least moderate stenosis (series 401, image 39 and series 403, image 143). There is mild calcified plaque in the right V2 segment. No other right vertebral artery stenosis to the skullbase. No left vertebral artery stenosis to the skullbase. CTA HEAD Posterior circulation: Both distal vertebral arteries are patent without stenosis. Normal vertebrobasilar junction. Normal right PICA origin. Dominant appearing left AICA origin. No basilar stenosis. Normal SCA and PCA origins. Both posterior communicating arteries are present, but are diminutive especially the left. Bilateral PCA branches are normal. Anterior circulation: Right ICA siphon is occluded until flow is reconstituted at the level of the right posterior communicating artery origin. There calcified plaque in the occluded left ICA just proximal to the reconstitution. The right ICA terminus is patent but  irregular. There is faint thread-like enhancement of the left ICA in the petrous segment. Little if any enhancement of the left siphon in the cavernous and proximal supraclinoid segments. Flow is reconstituted at the level of the left posterior communicating artery origin. The left ICA terminus is patent but irregular. The left ACA A1 segment appears to be dominant. The right is diminutive. The anterior communicating artery and bilateral ACA branches are patent, but somewhat diminutive. Both MCA M1 segments are patent but diminutive. The bilateral MCA bifurcations are patent. No MCA M2 branch occlusion is identified. The bilateral MCA branches are patent but somewhat diminutive. Venous sinuses: Patent on delayed images. Anatomic variants: Dominant left and diminutive right ACA A1 segments. Delayed phase: Evolving cytotoxic edema in the anterior left frontal lobe, MCA/ACA watershed areas. Progressed right frontal lobe white matter hypodensity corresponding to the more subacute infarct. No abnormal enhancement identified. No acute intracranial hemorrhage identified. No midline shift, mass effect, or evidence of intracranial mass lesion. Review of the MIP images confirms the above findings IMPRESSION: 1. Functional occlusion of both ICA siphons with suboptimal reconstitution of flow at both ICA termini: - Radiographic string sign stenosis at the right ICA origin in the neck related to bulky low-density plaque or thrombus. The cervical Right ICA demonstrates thread-like enhancement and appears to occlude at the skullbase. I suspect much of the Right ICA siphon is fully occluded. - Tapered appearance of the cervical Left ICA without focal stenosis in the neck. There is thread-like enhancement in the Left ICA siphon, but I suspect the supraclinoid Left ICA segment  is fully occluded. Thus the tapering in the neck is likely do to distal outflow obstruction. - Reconstituted flow from the posterior communicating arteries,  which unfortunately are diminutive. - No ACA or MCA occlusion identified, but the anterior circulation appears diminutive throughout. 2. Preliminary report of the above was discussed with Dr. Elpidio Anis at 1138 hours. 3. Posterior circulation is largely normal. There does appear to be a moderate or severe atherosclerotic stenosis at the right vertebral artery origin, but no other posterior circulation stenosis. 4. Expected CT appearance of evolving bilateral anterior circulation infarcts. No associated hemorrhage or mass effect. 5. No other abnormality in the neck. Mild emphysema in the lung apices. Electronically Signed   By: Genevie Ann M.D.   On: 10/22/2016 12:24   Mr Brain Wo Contrast  Result Date: 10/22/2016 CLINICAL DATA:  50 year old male presented to the [facility] ED yesterday with 2 weeks of increasing confusion, gait disturbance, and parasthesias. Head CT and brain MRI revealed a large, right frontal white matter lesion without mass effect. He was prescribed 8 mg dexamethasone BID, Keppra, 500 mg BID, and discharged since his neurologic status was grossly normal and he was in no acute distress. Overnight after leaving the the ED he symptoms worsened with slurred speech, confusion, lethargy, and intermittent flailing movements of his left arm. His condition has rapidly deteriorated; at Thanksgiving, he was less talkative then normal, seemed somewhat distracted, and was slurring his speech slightly. He was totally normal 3 weeks ago, with insidious and progressive symptoms until rapid changes in last 2 days. Subsequent encounter. EXAM: MRI HEAD WITHOUT CONTRAST TECHNIQUE: Multiplanar, multiecho pulse sequences of the brain and surrounding structures were obtained without intravenous contrast. COMPARISON:  Head CT without contrast 10/01/2016, brain MRI without and with contrast 10/20/2016. FINDINGS: Brain: New gray and white matter restricted diffusion in the anterior left frontal lobe, involving left MCA/  ACA watershed territory. Small foci of restricted diffusion in the left basal ganglia. Mildly enlarged area of anterior right frontal lobe white matter restricted diffusion which was initially present on 10/20/2016. Occasional other small scattered cortically based areas of restricted diffusion in the right superior frontal gyrus. Associated increased T2 and FLAIR hyperintensity, in the anterior left frontal lobe most resembling cytotoxic edema. No evidence of associated hemorrhage. No significant intracranial mass effect. No thalamic or posterior posterior circulation restricted diffusion. Basilar cisterns remain patent. No ventriculomegaly. Signal in the right basal ganglia, thalami, brainstem, and cerebellum remains normal. Vascular: Development of abnormal bilateral ICA flow voids, both are lost in the distal neck and through the ICA siphons but with evidence of some reconstituted flow at the MCA and ACA origins. The vertebrobasilar flow voids remain normal. Skull and upper cervical spine: Negative. Normal bone marrow signal. Sinuses/Orbits: Stable. Other: Stable scalp soft tissues. IMPRESSION: 1. Emergent Anterior Circulation Large Vessel Occlusions - both Internal Carotid Arteries likely affected. Progressive bilateral anterior circulation infarcts, newly involving the left MCA/ACA territories. 2. No associated hemorrhage or mass effect at this time. 3. This was discussed by telephone with Dr. Elpidio Anis On 10/22/2016 at 1037 hours. We are arranging for the patient to undergo stat CTA head and neck at the time of this dictation. Electronically Signed   By: Genevie Ann M.D.   On: 10/22/2016 10:43   Mr Cervical Spine Wo Contrast  Result Date: 10/22/2016 CLINICAL DATA:  50 year old male with deteriorating neurologic status following initial presentation on 10/20/2016 with right frontal lobe white matter lesion. Initial encounter. EXAM: MRI CERVICAL  SPINE WITHOUT CONTRAST TECHNIQUE: Multiplanar, multisequence MR  imaging of the cervical spine was performed. No intravenous contrast was administered. COMPARISON:  Brain MRI without contrast today reported separately. FINDINGS: Study is intermittently degraded by motion artifact despite repeated imaging attempts. Alignment: Mild straightening of cervical lordosis. Vertebrae: No marrow edema or evidence of acute osseous abnormality. Cord: Spinal cord signal is within normal limits at all visualized levels. Posterior Fossa, vertebral arteries, paraspinal tissues: Cervicomedullary junction is within normal limits. Negative visualized brain parenchyma. While the vertebral artery flow voids appear preserved throughout the neck, the carotid flow voids seem to be absent distal to the carotid bifurcations. Disc levels: No cervical spinal stenosis. Mild for age cervical degeneration. There is more mild to moderate facet and uncovertebral hypertrophy in the cervical spine resulting in mild or moderate neural foraminal stenosis at the bilateral C4, left greater than right C5, and bilateral C6 nerve levels. IMPRESSION: 1. Abnormal brain MRI today reported separately. 2. Mild for age cervical spine degeneration. No cervical spinal stenosis or spinal cord abnormality. Mild or up to moderate degenerative cervical foraminal stenosis. Electronically Signed   By: Genevie Ann M.D.   On: 10/22/2016 11:04   Dg Abd Portable 1v  Result Date: 10/22/2016 CLINICAL DATA:  Check nasogastric catheter placement EXAM: PORTABLE ABDOMEN - 1 VIEW COMPARISON:  None. FINDINGS: Scattered large and small bowel gas is noted. Nasogastric catheter is noted within the stomach. No bony abnormality is seen. Contrast from recent CT is noted. IMPRESSION: Nasogastric catheter within the stomach. Electronically Signed   By: Inez Catalina M.D.   On: 10/22/2016 15:11   EEG   No seizure or definite evidence of seizure predisposition was seen. Please note that lack of epileptiform findings does not preclude the possibility of  epilepsy.    PHYSICAL EXAM Middle-aged Caucasian male who is not in distress. . Afebrile. Head is nontraumatic. Neck is supple without bruit.    Cardiac exam no murmur or gallop. Lungs are clear to auscultation. Distal pulses are well felt. Neurological Exam : Patient is stuporous. In response to sternal rub purposeful activity on the right. Eyes are closed. Pupils are equal 4 mm reactive. He has spontaneous side to side eye movements. Fundi could not be visualized. Vision acuity and fields cannot be tested reliably. Patient moves right side purposefully against gravity without weakness. He has mild left-sided weakness but is able to move left arm and left leg against gravity but not to the same degree as the right side. He does not follow any commands. He moans and groans but doesn't does not have any spontaneous speech and does not follow commands. Deep tendon reflexes are 2+ symmetric.  Plantars have an ongoing response ASSESSMENT/PLAN Mr. Emmett Schurtz is a 50 y.o. male with history of  newly diagnosed R frontal mass, DM2, ETOH abuse HLD and HTN  presenting with confusion, L sided numbness and tingling x 2 weeks, neuro consulted for possible seizure.   Stroke:  Progressive bilateral anterior circulation infarcts in setting of severe bilateral ICA disease - ? ICA disease d/t trauma  And dissection. He does have vascular risk factors but no known prior significant vascular disease R anterior circulation white matter  Subacute infarct  1-2 weeks ago New L MCA/ACA watershed infarct 2 days ago  Resultant  R sided weakness, lethargy, not following commands, dysphagia.   MRI head  Progressive B anterior circulation infarcts, L MCA/ACA infarct.  MRI CS mild to moderate degenerative cervical foraminal stenosis  CTA head  and neck  Functional occlusion B ICAs with reconstitution.  2D Echo  pending   LDL 90  HgbA1c pending  SCDs for VTE prophylaxis Diet NPO time specified Except for: Sips with  Meds  No antithrombotic prior to admission, now on aspirin 325 mg daily and clopidogrel 75 mg daily  Ongoing aggressive stroke risk factor management  Taper steroids underway  Therapy recommendations:  pending   Disposition:  pending (lives w/ wife, works as a Patent examiner)  Seizures  On Keppra 1000 bid  Consider tapering  Carotid Disease, bilateral  Unusual presentation  Atherosclerosis  vs traumatic dissection etiology  cerebral angiogram - have requested Dr. Estanislado Pandy to do today if available  Hypertensive Urgency  BP as high as 187/103 Permissive hypertension (OK if < 220/120)   Long-term BP goal normotensive  Hyperlipidemia  Home meds:  lipitor 12  Lipitor increased to 80 in hospital  LDL 90, goal < 70  Continue statin at discharge  Diabetes  HgbA1c pending, goal < 7.0  Alcohol Abuse  Heavy drinker per wife  Recent fall on 11/9 after birthday party  [place] again last week  CIWA protocol via "ED psych Hold w/ CIWA"  Dysphagia  Secondary to stroke  Placed NG for aspriin and plavix  Other Stroke Risk Factors  Cigarette smoker, advised to stop smoking  Overweight, Body mass index is 29.09 kg/m., recommend weight loss, diet and exercise as appropriate  obstructive sleep apnea, inconsistent use of CPAP at home  Other Active Problems  Erectile dysfunction  Hospital day # 2  BIBY,SHARON  B[place] for Pager information 10/23/2016 9:55 AM  I have personally examined this patient, reviewed notes, independently viewed imaging studies, participated in medical decision making and plan of care.ROS completed by me personally and pertinent positives fully documented  I have made any additions or clarifications directly to the above note. Agree with note above.  Patient has had a somewhat unusual  atypical presentation of stroke with fluctuating confusion and left-sided paresthesias and initial CT scan and MRI were  felt to show a right frontal lesion possibly malignancy but repeat MRI clearly shows a new left frontal region and in retrospect both lesions appear to be infarct the one on the right is subacute and older and the one on the left is more acute. 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 Recommend emergent cerebral catheter angiogram to fully understand the status of his carotid disease. I had a long discussion with the wife at the bedside regarding his clinical presentation, discuss imaging findings, plan for further evaluation, treatment and answered questions. Agree with dual antiplatelet therapy. Alcohol withdrawal precautions. Treatment with thiamine. The patient's wife gave me permission to discuss his care with a family friend Dr. Morene Antu. Discussed with Dr. Estanislado Pandy. This patient is critically ill and at significant risk of neurological worsening, death and care requires constant monitoring of vital signs, hemodynamics,respiratory and cardiac monitoring, extensive review of multiple databases, frequent neurological assessment, discussion with family, other specialists and medical decision making of high complexity.I have made any additions or clarifications directly to the above note.This critical care time does not reflect procedure time, or teaching time or supervisory time of PA/NP/Med Resident etc but could involve care discussion time.  I spent 40  minutes of neurocritical care time  in the care of  this patient.     Antony Contras, MD Medical Director [facility] Pager: [phone] 10/23/2016 1:00 PM  To contact Stroke Continuity provider, please refer to [email or web address] After hours, contact General Neurology  ADDENDUM : Personally reviewed cerebral catheter  angiogram films with Dr. Estanislado Pandy. Long discussion with patient's mother and wife at the bedside about implications, prognosis, plan for treatment and answered questions. 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 Patient's brain is being perfused mostly through collaterals from posterior circulation and to some degree from external circulation. He is currently not a candidate for revascularization recommend permissive hypertension and keep systolic blood pressure greater than 130. We may consider elective right vertebral origin angioplasty/stenting in 2-3 weeks to augment his brain flow depending upon his clinical improvement. Minimize sedation if possible. Change Keppra to Depacon for seizures. Check continuous EEG for seizure activity. Start nutrition via tube feeds. Discussed with Dr. Morene Antu upon request from patient's mother and wife. Antony Contras, MD Medical Director [facility] Pager: [phone] 10/23/2016 4:03 PM

## 2016-10-23 NOTE — Progress Notes (Signed)
Chief Complaint: Patient was seen in consultation today for cerebral arteriogram at the request of  Dr. Antony Contras  Referring Physician(s): Dr. Antony Contras  Supervising Physician: Luanne Bras  Patient Status: [facility] - In-pt  History of Present Illness: Scott Harris is a 50 y.o. male who presented with progressive left sided numbness and altered mental status. Admitted and initial imaging suggested possible periventricular lesion, but continued symptoms prompted further imaging and now finds frontal CVA with critical bilateral ICA stenosis/occlusions. He has been placed on ASA and Plavix. NIR is asked to do cerebral arteriogram. Chart, imaging, meds, labs, allergies reviewed. Wife at bedside.  Past Medical History:  Diagnosis Date  . Diabetes mellitus without complication ([place])   . Hyperlipemia   . Hypertension     Past Surgical History:  Procedure Laterality Date  . CHOLECYSTECTOMY    . HAND SURGERY    . TONSILLECTOMY      Allergies: Patient has no known allergies.  Medications:  Current Facility-Administered Medications:  .  acetaminophen (TYLENOL) tablet 650 mg, 650 mg, Oral, Q6H PRN **OR** acetaminophen (TYLENOL) suppository 650 mg, 650 mg, Rectal, Q6H PRN, Collier Salina, MD .  aspirin tablet 325 mg, 325 mg, Per NG tube, Daily, Minus Liberty, MD .  atorvastatin (LIPITOR) tablet 80 mg, 80 mg, Per NG tube, q1800, Minus Liberty, MD, 80 mg at 10/22/16 1608 .  clopidogrel (PLAVIX) tablet 75 mg, 75 mg, Per NG tube, Daily, Minus Liberty, MD, 75 mg at 10/22/16 1608 .  FLUoxetine (PROZAC) capsule 20 mg, 20 mg, Per Tube, q morning - 10a, Minus Liberty, MD .  Influenza vac split quadrivalent PF (FLUARIX) injection 0.5 mL, 0.5 mL, Intramuscular, Tomorrow-1000, Sid Falcon, MD .  insulin aspart (novoLOG) injection 0-5 Units, 0-5 Units, Subcutaneous, QHS, Collier Salina, MD, 2 Units at 10/22/16 2207 .  insulin aspart (novoLOG) injection 0-9  Units, 0-9 Units, Subcutaneous, TID WC, Collier Salina, MD, 5 Units at 10/23/16 [phone] .  levETIRAcetam (KEPPRA) 1,000 mg in sodium chloride 0.9 % 100 mL IVPB, 1,000 mg, Intravenous, BID, Minus Liberty, MD, 1,000 mg at 10/22/16 2206 .  pantoprazole (PROTONIX) EC tablet 40 mg, 40 mg, Oral, Daily, Collier Salina, MD .  pneumococcal 23 valent vaccine (PNU-IMMUNE) injection 0.5 mL, 0.5 mL, Intramuscular, Tomorrow-1000, Sid Falcon, MD .  sodium chloride 0.9 % 1,000 mL infusion, , Intravenous, Continuous, Minus Liberty, MD, Last Rate: 100 mL/hr at 10/22/16 1606 .  thiamine (VITAMIN B-1) tablet 100 mg, 100 mg, Oral, Daily **OR** thiamine (B-1) injection 100 mg, 100 mg, Intravenous, Daily, Daleen Bo, MD, 100 mg at 10/22/16 1216    History reviewed. No pertinent family history.  Social History   Social History  . Marital status: Divorced    Spouse name: N/A  . Number of children: N/A  . Years of education: N/A   Social History Main Topics  . Smoking status: Current Every Day Smoker    Packs/day: 1.50    Years: 20.00    Types: Cigarettes  . Smokeless tobacco: Never Used  . Alcohol use Yes     Comment: Socially   . Drug use: No  . Sexual activity: Not Asked   Other Topics Concern  . None   Social History Narrative  . None    Review of Systems: A 12 point ROS discussed and pertinent positives are indicated in the HPI above.  All other systems are negative.  Review of Systems  Vital Signs: BP (!) 187/103 (  BP Location: Right Arm)   Pulse 97   Temp 98.9 F (37.2 C) (Axillary)   Resp 19   Ht 5\' 8"  (1.727 m)   Wt 191 lb 4.8 oz (86.8 kg)   SpO2 93%   BMI 29.09 kg/m   Physical Exam  HENT:  Head: Normocephalic.  Mouth/Throat: Oropharynx is clear and moist.  Neck: No JVD present. No tracheal deviation present.  Cardiovascular: Normal rate, regular rhythm, normal heart sounds and intact distal pulses.   Pulmonary/Chest: Effort normal and breath sounds  normal. No respiratory distress.  Neurological:  Pt not responsive to me  Skin: Skin is warm and dry.     Mallampati Score:  MD Evaluation Airway: WNL Heart: WNL Abdomen: WNL Chest/ Lungs: WNL ASA  Classification: 4 Mallampati/Airway Score: Two  Imaging: Ct Angio Neck/Head  Result Date: 10/22/2016 CLINICAL DATA:  50 year old male with evidence of bilateral ICA poor flow or occlusion on brain MRI today. Acute anterior circulation infarcts. Initial encounter. EXAM: CT ANGIOGRAPHY HEAD AND NECK TECHNIQUE: Multidetector CT imaging of the head and neck was performed using the standard protocol during bolus administration of intravenous contrast. Multiplanar CT image reconstructions and MIPs were obtained to evaluate the vascular anatomy. Carotid stenosis measurements (when applicable) are obtained utilizing NASCET criteria, using the distal internal carotid diameter as the denominator. CONTRAST:  50 mL Isovue 370 COMPARISON:  Brain MRI 0941 hours today, and earlier. FINDINGS: CTA NECK Skeleton: Largely unremarkable. No acute osseous abnormality identified. Visualized paranasal sinuses and mastoids are stable and well pneumatized. Upper chest: Mild paraseptal emphysema. Mild dependent pulmonary atelectasis. Incidental azygos fissure. No superior mediastinal lymphadenopathy. Other neck: Negative thyroid. Larynx, pharynx, parapharyngeal spaces, retropharyngeal space, sublingual space, submandibular glands and parotid glands are within normal limits. Visualized orbits and scalp soft tissues are within normal limits. No cervical lymphadenopathy. Aortic arch: 3 vessel arch configuration. Minimal soft plaque in the distal arch. Right carotid system: Normal brachiocephalic and right CCA artery origins. There is low-density soft plaque along the anterior right CCA at the level of the larynx (series 401, image 63) which is not hemodynamically significant. At the right carotid bifurcation there is combined soft  and calcified plaque affecting the right ICA origin and bulb, and a radiographic string sign stenosis results throughout the proximal right ICA (series 404, image 82). Thread-like right ICA enhancement continues distally, but the vessel appears to occlude at the level of the skullbase (series 401, image 121). See intracranial findings below. Left carotid system: Negative left CCA origin. No significant plaque in the left CCA. At the left carotid bifurcation there is calcified plaque at the posterior left ICA origin which is not hemodynamically significant. However, distal to the origin the left ICA slowly tapers resulting in a vessel about 1/2 of its normal size below the skullbase (series 406, image 27). There is no focal high-grade stenosis. The vessel remains patent to the skullbase, see below. Vertebral arteries:No proximal left subclavian artery stenosis despite soft and calcified plaque. Normal proximal right subclavian. Normal left vertebral artery origin. There is soft plaque at the right vertebral artery origin resulting in at least moderate stenosis (series 401, image 39 and series 403, image 143). There is mild calcified plaque in the right V2 segment. No other right vertebral artery stenosis to the skullbase. No left vertebral artery stenosis to the skullbase. CTA HEAD Posterior circulation: Both distal vertebral arteries are patent without stenosis. Normal vertebrobasilar junction. Normal right PICA origin. Dominant appearing left AICA origin. No basilar stenosis.  Normal SCA and PCA origins. Both posterior communicating arteries are present, but are diminutive especially the left. Bilateral PCA branches are normal. Anterior circulation: Right ICA siphon is occluded until flow is reconstituted at the level of the right posterior communicating artery origin. There calcified plaque in the occluded left ICA just proximal to the reconstitution. The right ICA terminus is patent but irregular. There is faint  thread-like enhancement of the left ICA in the petrous segment. Little if any enhancement of the left siphon in the cavernous and proximal supraclinoid segments. Flow is reconstituted at the level of the left posterior communicating artery origin. The left ICA terminus is patent but irregular. The left ACA A1 segment appears to be dominant. The right is diminutive. The anterior communicating artery and bilateral ACA branches are patent, but somewhat diminutive. Both MCA M1 segments are patent but diminutive. The bilateral MCA bifurcations are patent. No MCA M2 branch occlusion is identified. The bilateral MCA branches are patent but somewhat diminutive. Venous sinuses: Patent on delayed images. Anatomic variants: Dominant left and diminutive right ACA A1 segments. Delayed phase: Evolving cytotoxic edema in the anterior left frontal lobe, MCA/ACA watershed areas. Progressed right frontal lobe white matter hypodensity corresponding to the more subacute infarct. No abnormal enhancement identified. No acute intracranial hemorrhage identified. No midline shift, mass effect, or evidence of intracranial mass lesion. Review of the MIP images confirms the above findings IMPRESSION: 1. Functional occlusion of both ICA siphons with suboptimal reconstitution of flow at both ICA termini: - Radiographic string sign stenosis at the right ICA origin in the neck related to bulky low-density plaque or thrombus. The cervical Right ICA demonstrates thread-like enhancement and appears to occlude at the skullbase. I suspect much of the Right ICA siphon is fully occluded. - Tapered appearance of the cervical Left ICA without focal stenosis in the neck. There is thread-like enhancement in the Left ICA siphon, but I suspect the supraclinoid Left ICA segment is fully occluded. Thus the tapering in the neck is likely do to distal outflow obstruction. - Reconstituted flow from the posterior communicating arteries, which unfortunately are  diminutive. - No ACA or MCA occlusion identified, but the anterior circulation appears diminutive throughout. 2. Preliminary report of the above was discussed with Dr. Elpidio Anis at 1138 hours. 3. Posterior circulation is largely normal. There does appear to be a moderate or severe atherosclerotic stenosis at the right vertebral artery origin, but no other posterior circulation stenosis. 4. Expected CT appearance of evolving bilateral anterior circulation infarcts. No associated hemorrhage or mass effect. 5. No other abnormality in the neck. Mild emphysema in the lung apices. Electronically Signed   By: Genevie Ann M.D.   On: 10/22/2016 12:24   Scott Brain Wo Contrast  Result Date: 10/22/2016 CLINICAL DATA:  50 year old male presented to the [facility] ED yesterday with 2 weeks of increasing confusion, gait disturbance, and parasthesias. Head CT and brain MRI revealed a large, right frontal white matter lesion without mass effect. He was prescribed 8 mg dexamethasone BID, Keppra, 500 mg BID, and discharged since his neurologic status was grossly normal and he was in no acute distress. Overnight after leaving the the ED he symptoms worsened with slurred speech, confusion, lethargy, and intermittent flailing movements of his left arm. His condition has rapidly deteriorated; at Thanksgiving, he was less talkative then normal, seemed somewhat distracted, and was slurring his speech slightly. He was totally normal 3 weeks ago, with insidious and progressive symptoms until rapid changes in  last 2 days. Subsequent encounter. EXAM: MRI HEAD WITHOUT CONTRAST TECHNIQUE: Multiplanar, multiecho pulse sequences of the brain and surrounding structures were obtained without intravenous contrast. COMPARISON:  Head CT without contrast 10/22/2016, brain MRI without and with contrast 10/20/2016. FINDINGS: Brain: New gray and white matter restricted diffusion in the anterior left frontal lobe, involving left MCA/ ACA watershed territory.  Small foci of restricted diffusion in the left basal ganglia. Mildly enlarged area of anterior right frontal lobe white matter restricted diffusion which was initially present on 10/20/2016. Occasional other small scattered cortically based areas of restricted diffusion in the right superior frontal gyrus. Associated increased T2 and FLAIR hyperintensity, in the anterior left frontal lobe most resembling cytotoxic edema. No evidence of associated hemorrhage. No significant intracranial mass effect. No thalamic or posterior posterior circulation restricted diffusion. Basilar cisterns remain patent. No ventriculomegaly. Signal in the right basal ganglia, thalami, brainstem, and cerebellum remains normal. Vascular: Development of abnormal bilateral ICA flow voids, both are lost in the distal neck and through the ICA siphons but with evidence of some reconstituted flow at the MCA and ACA origins. The vertebrobasilar flow voids remain normal. Skull and upper cervical spine: Negative. Normal bone marrow signal. Sinuses/Orbits: Stable. Other: Stable scalp soft tissues. IMPRESSION: 1. Emergent Anterior Circulation Large Vessel Occlusions - both Internal Carotid Arteries likely affected. Progressive bilateral anterior circulation infarcts, newly involving the left MCA/ACA territories. 2. No associated hemorrhage or mass effect at this time. 3. This was discussed by telephone with Dr. Elpidio Anis On 10/22/2016 at 1037 hours. We are arranging for the patient to undergo stat CTA head and neck at the time of this dictation. Electronically Signed   By: Genevie Ann M.D.   On: 10/22/2016 10:43   Scott Harris [id number] Contrast  Result Date: 10/20/2016 CLINICAL DATA:  Left facial droop.  Tingling and numbness. EXAM: MRI HEAD WITHOUT AND WITH CONTRAST TECHNIQUE: Multiplanar, multiecho pulse sequences of the brain and surrounding structures were obtained without and with intravenous contrast. CONTRAST:  65mL MULTIHANCE GADOBENATE DIMEGLUMINE 529  MG/ML IV SOLN COMPARISON:  Head CT 10/20/2016 FINDINGS: Brain: There is a focal area of diffusion restriction and hyperintense T2 weighted signal within the white matter of the right frontal lobe, measuring approximately 3.5 x 1.6 cm. There is no associated contrast enhancement.There is no associated midline shift or mass effect. No other focal lesions are identified. There is no evidence of intracranial hemorrhage. Vascular: The major intracranial flow voids are preserved. Skull and upper cervical spine: Calvarium and visualized upper cervical spine are normal. Sinuses/Orbits: Normal sinuses and orbits. Other: None IMPRESSION: 1. Area of diffusion restriction and hyperintense T2 weighted signal within the right frontal white matter. The distribution is not typical of ischemia and the appearance is concerning for a neoplastic process, such as a low grade glioma. Tumefactive demyelinating disease is also a consideration, though if the patient's symptoms are truly acute, some degree of contrast enhancement would be expected. Short-term follow-up with brain MRI with and without contrast in 4-6 weeks is recommended, as change over time may be helpful in diagnosis. Additionally, CSF sampling should be considered. 2. No hemorrhage or mass effect. Electronically Signed   By: Ulyses Jarred M.D.   On: 10/20/2016 17:12   Scott Cervical Spine Wo Contrast  Result Date: 10/22/2016 CLINICAL DATA:  50 year old male with deteriorating neurologic status following initial presentation on 10/20/2016 with right frontal lobe white matter lesion. Initial encounter. EXAM: MRI CERVICAL SPINE WITHOUT CONTRAST TECHNIQUE: Multiplanar, multisequence Scott  imaging of the cervical spine was performed. No intravenous contrast was administered. COMPARISON:  Brain MRI without contrast today reported separately. FINDINGS: Study is intermittently degraded by motion artifact despite repeated imaging attempts. Alignment: Mild straightening of cervical  lordosis. Vertebrae: No marrow edema or evidence of acute osseous abnormality. Cord: Spinal cord signal is within normal limits at all visualized levels. Posterior Fossa, vertebral arteries, paraspinal tissues: Cervicomedullary junction is within normal limits. Negative visualized brain parenchyma. While the vertebral artery flow voids appear preserved throughout the neck, the carotid flow voids seem to be absent distal to the carotid bifurcations. Disc levels: No cervical spinal stenosis. Mild for age cervical degeneration. There is more mild to moderate facet and uncovertebral hypertrophy in the cervical spine resulting in mild or moderate neural foraminal stenosis at the bilateral C4, left greater than right C5, and bilateral C6 nerve levels. IMPRESSION: 1. Abnormal brain MRI today reported separately. 2. Mild for age cervical spine degeneration. No cervical spinal stenosis or spinal cord abnormality. Mild or up to moderate degenerative cervical foraminal stenosis. Electronically Signed   By: Genevie Ann M.D.   On: 10/22/2016 11:04    Labs:  CBC:  Recent Labs  10/20/16 1322 10/20/16 1332 10/20/2016 0806 10/22/16 0404  WBC 7.7  --  5.7 10.4  HGB 16.5 16.3 16.3 16.1  HCT 45.6 48.0 45.7 45.3  PLT 264  --  252 276    COAGS:  Recent Labs  10/20/16 1322  INR 0.89  APTT 26    BMP:  Recent Labs  10/20/16 1322 10/20/16 1332 10/20/2016 0806 10/22/16 0404  NA 134* 135 133* 137  K 4.4 4.6 4.6 4.4  CL 101 101 101 101  CO2 23  --  22 24  GLUCOSE 214* 209* 292* 222*  BUN 14 16 17 15   CALCIUM 9.7  --  9.7 9.9  CREATININE 0.81 0.80 0.87 0.81  GFRNONAA >60  --  >60 >60  GFRAA >60  --  >60 >60    LIVER FUNCTION TESTS:  Recent Labs  10/20/16 1322  BILITOT 0.7  AST 42*  ALT 97*  ALKPHOS 78  PROT 8.1  ALBUMIN 4.9    TUMOR MARKERS: No results for input(s): AFPTM, CEA, CA199, CHROMGRNA in the last 8760 hours.  Assessment and Plan: CVA secondary to bilateral critical ICA  stenoses/occlusions For cerebral arteriogram today, unknown if intervention indicated Wife agreeable to procedure after discussed Risks and Benefits discussed with the patient including, but not limited to bleeding, infection, vascular injury, contrast induced renal failure, stroke or even death. All of the patient's questions were answered, patient is agreeable to proceed. Consent signed and in chart.   Thank you for this interesting consult.  I greatly enjoyed meeting Dejesus Brena and look forward to participating in their care.  A copy of this report was sent to the requesting provider on this date.  Electronically Signed: Ascencion Dike 10/23/2016, 11:01 AM   I spent a total of 25 minutes in face to face in clinical consultation, greater than 50% of which was counseling/coordinating care for cerebral arteriogram

## 2016-10-23 NOTE — Sedation Documentation (Signed)
ED Provider at bedside. Pt is not arousable at this time.

## 2016-10-23 NOTE — Sedation Documentation (Signed)
Pt tolerated procedure well, no new agitation at this time.

## 2016-10-23 NOTE — Sedation Documentation (Signed)
Vital signs stable. 

## 2016-10-23 NOTE — Progress Notes (Signed)
Initial Nutrition Assessment  DOCUMENTATION CODES:   Not applicable  INTERVENTION:    Initiate TF via NGT with Glucerna 1.2 at 30 ml/h, increase by 10 ml every 4 hours to goal rate of 70 ml/h to provide 2016 kcals, 101 gm protein, 1352 ml free water daily.  NUTRITION DIAGNOSIS:   Inadequate oral intake related to inability to eat as evidenced by NPO status.  GOAL:   Patient will meet greater than or equal to 90% of their needs  MONITOR:   Labs, Weight trends, TF tolerance, I & O's  REASON FOR ASSESSMENT:   Consult Enteral/tube feeding initiation and management  ASSESSMENT:   50yo man with PMH of DM2, HTN, HLD who presented with slurred speech, lethargy and confusion.  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 He is currently not a candidate for revascularization per MD notes.  Received MD Consult for TF initiation and management. NGT was placed by RN. Tip just above the GE junction. Plans to advance the tube.  Nutrition-Focused physical exam completed. Findings are no fat depletion, no muscle depletion, and no edema.  Labs reviewed: A1C: 8.5, triglycerides 157 CBG's: [phone] Medications reviewed and include thiamine.   Diet Order:  Diet NPO time specified Except for: Sips with Meds  Skin:  Reviewed, no issues  Last BM:  11/25  Height:   Ht Readings from Last 1 Encounters:  10/17/2016 5\' 8"  (1.727 m)    Weight:   Wt Readings from Last 1 Encounters:  10/14/2016 191 lb 4.8 oz (86.8 kg)    Ideal Body Weight:  70 kg  BMI:  Body mass index is 29.09 kg/m.  Estimated Nutritional Needs:   Kcal:  1900-2100  Protein:  90-110 gm  Fluid:  >/= 2 L  EDUCATION NEEDS:   No education needs identified at this time  Molli Barrows, [place], [place], [place] Pager [phone] After Hours Pager [phone]

## 2016-10-23 NOTE — Progress Notes (Signed)
Subjective: No acute events overnight.    Wife Estill Bamberg expressing appropriate concern about his status.  Wants to know his prognosis, clarification about his clinical course, and asking if requesting transfer to [facility] might offer different treatment options.  Discussed uncertainty in prognostication in acute stroke, possibility of vascular surgery for revascularization but need for more information in determining timing of any intervention.  Objective:  Vital signs in last 24 hours: Vitals:   10/22/16 2200 10/23/16 0400 10/23/16 0410 10/23/16 0655  BP: (!) 167/106 (!) 183/100 (!) 183/100 (!) 173/100  Pulse: 83 85 82 90  Resp: 19 (!) 23 (!) 21 (!) 25  Temp: 98.3 F (36.8 C)  98 F (36.7 C)   TempSrc: Axillary  Axillary   SpO2: 93% 93% 93% 95%  Weight:      Height:       Physical Exam  Constitutional:  Lying in bed with eyes closed and right arm on his forehead  HENT:  Head: Normocephalic and atraumatic.  Cardiovascular: Normal rate and regular rhythm.   Pulmonary/Chest: Effort normal and breath sounds normal.  Neurological:  GCS 9 (E1V2M6) Pupils equal, sluggishly reactive Moves all 4 extremities spontaneous and inconsistently on command   MRI Brain without Contrast 10/22/2016 IMPRESSION: 1. Emergent Anterior Circulation Large Vessel Occlusions - both Internal Carotid Arteries likely affected. Progressive bilateral anterior circulation infarcts, newly involving the left MCA/ACA territories. 2. No associated hemorrhage or mass effect at this time. 3. This was discussed by telephone with Dr. Elpidio Anis On 10/22/2016 at 1037 hours. We are arranging for the patient to undergo stat CTA head and neck at the time of this dictation.  CTA Head and Neck 10/22/2016  IMPRESSION: 1. Functional occlusion of both ICA siphons with suboptimal reconstitution of flow at both ICA termini:  - Radiographic string sign stenosis at the right ICA origin in the neck related to bulky  low-density plaque or thrombus. The cervical Right ICA demonstrates thread-like enhancement and appears to occlude at the skullbase. I suspect much of the Right ICA siphon is fully occluded.  - Tapered appearance of the cervical Left ICA without focal stenosis in the neck. There is thread-like enhancement in the Left ICA siphon, but I suspect the supraclinoid Left ICA segment is fully occluded. Thus the tapering in the neck is likely do to distal outflow obstruction.  - Reconstituted flow from the posterior communicating arteries, which unfortunately are diminutive.  - No ACA or MCA occlusion identified, but the anterior circulation appears diminutive throughout.  2. Preliminary report of the above was discussed with Dr. Elpidio Anis at 1138 hours. 3. Posterior circulation is largely normal. There does appear to be a moderate or severe atherosclerotic stenosis at the right vertebral artery origin, but no other posterior circulation stenosis. 4. Expected CT appearance of evolving bilateral anterior circulation infarcts. No associated hemorrhage or mass effect. 5. No other abnormality in the neck. 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  Lipid Panel     Component Value Date/Time   CHOL 172 10/22/2016 1425   TRIG 157 (H) 10/22/2016 1425   HDL 51 10/22/2016 1425   CHOLHDL 3.4 10/22/2016 1425   VLDL 31 10/22/2016 1425   LDLCALC 90 10/22/2016 1425    Assessment/Plan:  Principal Problem:   Frontal mass of brain Active Problems:   Essential hypertension with goal blood pressure less than 130/80   Type 2 diabetes mellitus with complication (HCC)   Altered mental status   Alcoholism (HCC)   Seizures ([place])  50 year old man with acute on subacute neurologic impairments and imaging findings initially thought to be due to a right frontal brain mass now discovered to have bilateral CVAs secondary to bilateral ICA occlusions.  #Bilateral Frontal Strokes #Bilateral ICA Occlusion -Stroke  team following -Vascular surgery consulted -Aspirin 325 mg daily -Plavix 75 mg daily -Atorvastatin 80 mg daily -Continue Keppra 1000 mg BID -Carotid angiogram -Echo -F/u A1c  #HTN Permissive hypertension. -Hold lisinopril  #HL Increase statin therapy to high intensity with atorvastatin 80 mg  Dispo: Anticipated discharge contingent on clinical course.  Minus Liberty, MD 10/23/2016, 7:18 AM Pager: [phone]

## 2016-10-23 NOTE — Consult Note (Signed)
Patient name: Scott Harris MRN: [id number] DOB: 28-Aug-1966 Sex: male  REASON FOR CONSULT: Stroke with carotid occlusive disease  HPI: Scott Harris is a 50 y.o. male, very unusual presentation. Initially was felt to potentially have a brain mass. This was being evaluated knee return to the hospital with worsening neurologic condition. Further evaluation revealed bilateral cerebral strokes. CT angiogram and MRI revealed subtotal or totally occluded carotid arteries bilaterally. He did undergo cerebral arteriogram earlier today. Images are not available but the preliminary findings are noted. Patient has a history of fall approximately 2 weeks ago is unclear as to any relationship with this and this carotid occlusive disease.  Past Medical History:  Diagnosis Date  . Diabetes mellitus without complication ([place])   . Hyperlipemia   . Hypertension     History reviewed. No pertinent family history.  SOCIAL HISTORY: Social History   Social History  . Marital status: Divorced    Spouse name: N/A  . Number of children: N/A  . Years of education: N/A   Occupational History  . Not on file.   Social History Main Topics  . Smoking status: Current Every Day Smoker    Packs/day: 1.50    Years: 20.00    Types: Cigarettes  . Smokeless tobacco: Never Used  . Alcohol use Yes     Comment: Socially   . Drug use: No  . Sexual activity: Not on file   Other Topics Concern  . Not on file   Social History Narrative  . No narrative on file    No Known Allergies  Current Facility-Administered Medications  Medication Dose Route Frequency Provider Last Rate Last Dose  . 0.9 %  sodium chloride infusion   Intravenous Continuous Luanne Bras, MD 75 mL/hr at 10/23/16 1353    . acetaminophen (TYLENOL) tablet 650 mg  650 mg Oral Q6H PRN Collier Salina, MD       Or  . acetaminophen (TYLENOL) suppository 650 mg  650 mg Rectal Q6H PRN Collier Salina, MD      . aspirin tablet 325 mg  325  mg Per NG tube Daily Minus Liberty, MD      . atorvastatin (LIPITOR) tablet 80 mg  80 mg Per NG tube q1800 Minus Liberty, MD   80 mg at 10/22/16 1608  . clopidogrel (PLAVIX) tablet 75 mg  75 mg Per NG tube Daily Minus Liberty, MD   75 mg at 10/22/16 1608  . fentaNYL (SUBLIMAZE) 100 MCG/2ML injection           . hydrALAZINE (APRESOLINE) 20 MG/ML injection           . Influenza vac split quadrivalent PF (FLUARIX) injection 0.5 mL  0.5 mL Intramuscular Tomorrow-1000 Sid Falcon, MD      . insulin aspart (novoLOG) injection 0-9 Units  0-9 Units Subcutaneous Q4H Collier Salina, MD      . labetalol (NORMODYNE,TRANDATE) 5 MG/ML injection           . lidocaine (XYLOCAINE) 1 % (with pres) injection           . midazolam (VERSED) 2 MG/2ML injection           . pantoprazole (PROTONIX) EC tablet 40 mg  40 mg Oral Daily Collier Salina, MD      . pneumococcal 23 valent vaccine (PNU-IMMUNE) injection 0.5 mL  0.5 mL Intramuscular Tomorrow-1000 Sid Falcon, MD      . sodium chloride 0.9 % 1,000  mL infusion   Intravenous Continuous Minus Liberty, MD 100 mL/hr at 10/22/16 1606    . thiamine (VITAMIN B-1) tablet 100 mg  100 mg Oral Daily Daleen Bo, MD       Or  . thiamine (B-1) injection 100 mg  100 mg Intravenous Daily Daleen Bo, MD   100 mg at 10/22/16 1216  . valproate (DEPACON) 1,000 mg in sodium chloride 0.9 % 100 mL IVPB  1,000 mg Intravenous Once Garvin Fila, MD   1,000 mg at 10/23/16 1622  . valproate (DEPACON) 500 mg in sodium chloride 0.9 % 100 mL IVPB  500 mg Intravenous TID Garvin Fila, MD        REVIEW OF SYSTEMS:  Reviewed in his history and physical with nothing to add  PHYSICAL EXAM: Vitals:   10/23/16 1312 10/23/16 1324 10/23/16 1353 10/23/16 1507  BP: (!) 166/90 (!) 160/90 (!) 167/87 (!) 181/99  Pulse: 97 96 (!) 105 (!) 112  Resp: (!) 27 (!) 22 (!) 27 (!) 23  Temp:   98.3 F (36.8 C) 99.4 F (37.4 C)  TempSrc:   Oral Oral  SpO2: 97% 96%  94% 94%  Weight:      Height:        GENERAL: The patient is a well-nourished male, in no acute distress. The vital signs are documented above. VASCULAR: 2+ radial pulses bilaterally PULMONARY: There is good air exchange bilaterally  MUSCULOSKELETAL: There are no major deformities or cyanosis. NEUROLOGIC: The patient is moving all 4 extremities. There is no purposeful movement and he is not able to follow any commands. Unresponsive. SKIN: There are no ulcers or rashes noted. PSYCHIATRIC: The patient has a normal affect.  DATA:   CT, MRI and angiogram results all reviewed. Reviewed the CTA images. Patient has bilateral carotid occlusions and the carotid cavernous portion. Does have high-grade stenosis at the right vertebral artery origin  MEDICAL ISSUES:  Discussed with family present. No surgical options. Explain no role for addressing any of his extracranial occlusive issues with intracranial occlusion. They understand that this is supportive care currently. I will not follow actively. Please call if I can assist   Early, [facility] [employer] [phone]

## 2016-10-24 ENCOUNTER — Encounter (HOSPITAL_COMMUNITY): Payer: Self-pay | Admitting: Interventional Radiology

## 2016-10-24 ENCOUNTER — Inpatient Hospital Stay (HOSPITAL_COMMUNITY): Payer: 59

## 2016-10-24 DIAGNOSIS — E119 Type 2 diabetes mellitus without complications: Secondary | ICD-10-CM

## 2016-10-24 DIAGNOSIS — Z7984 Long term (current) use of oral hypoglycemic drugs: Secondary | ICD-10-CM

## 2016-10-24 DIAGNOSIS — I63523 Cerebral infarction due to unspecified occlusion or stenosis of bilateral anterior cerebral arteries: Secondary | ICD-10-CM

## 2016-10-24 DIAGNOSIS — I10 Essential (primary) hypertension: Secondary | ICD-10-CM

## 2016-10-24 DIAGNOSIS — I63033 Cerebral infarction due to thrombosis of bilateral carotid arteries: Secondary | ICD-10-CM

## 2016-10-24 LAB — GLUCOSE, CAPILLARY
GLUCOSE-CAPILLARY: 196 mg/dL — AB (ref 65–99)
GLUCOSE-CAPILLARY: 213 mg/dL — AB (ref 65–99)
GLUCOSE-CAPILLARY: 228 mg/dL — AB (ref 65–99)
GLUCOSE-CAPILLARY: 234 mg/dL — AB (ref 65–99)
Glucose-Capillary: 239 mg/dL — ABNORMAL HIGH (ref 65–99)
Glucose-Capillary: 241 mg/dL — ABNORMAL HIGH (ref 65–99)

## 2016-10-24 LAB — VALPROIC ACID LEVEL: VALPROIC ACID LVL: 58 ug/mL (ref 50.0–100.0)

## 2016-10-24 MED ORDER — GLYCERIN (LAXATIVE) 2.1 G RE SUPP
1.0000 | Freq: Once | RECTAL | Status: AC
  Administered 2016-10-24: 1 via RECTAL
  Filled 2016-10-24: qty 1

## 2016-10-24 MED ORDER — ENOXAPARIN SODIUM 40 MG/0.4ML ~~LOC~~ SOLN
40.0000 mg | SUBCUTANEOUS | Status: DC
  Administered 2016-10-24 – 2016-10-30 (×7): 40 mg via SUBCUTANEOUS
  Filled 2016-10-24 (×7): qty 0.4

## 2016-10-24 MED ORDER — INSULIN GLARGINE 100 UNIT/ML ~~LOC~~ SOLN
15.0000 [IU] | Freq: Every day | SUBCUTANEOUS | Status: DC
  Administered 2016-10-24: 15 [IU] via SUBCUTANEOUS
  Filled 2016-10-24 (×2): qty 0.15

## 2016-10-24 NOTE — Progress Notes (Signed)
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 Patient was not considered for IV t-PA. Due to late presentation and stroke not initially suspected   SUBJECTIVE (INTERVAL HISTORY) His wife is at the bedside.  He has received ativan and is very lethargic. Not opening eyes.Long term EEG does not show any seizures   OBJECTIVE Temp:  [98.4 F (36.9 C)-99.4 F (37.4 C)] 99.4 F (37.4 C) (11/28 1053) Pulse Rate:  [86-112] 86 (11/28 1300) Cardiac Rhythm: Sinus tachycardia;Normal sinus rhythm (11/28 1200) Resp:  [14-29] 14 (11/28 1300) BP: (163-202)/(88-163) 171/103 (11/28 1300) SpO2:  [91 %-96 %] 96 % (11/28 1300) Weight:  [195 lb 1 oz (88.5 kg)] 195 lb 1 oz (88.5 kg) (11/28 0406)  CBC:  Recent Labs Lab 10/20/16 1322  10/22/2016 0806 10/22/16 0404  WBC 7.7  --  5.7 10.4  NEUTROABS 5.0  --  5.0  --   HGB 16.5  < > 16.3 16.1  HCT 45.6  < > 45.7 45.3  MCV 87.4  --  86.6 87.1  PLT 264  --  252 276  < > = values in this interval not displayed.  Basic Metabolic Panel:   Recent Labs Lab 10/24/2016 0806 10/22/16 0404  NA 133* 137  K 4.6 4.4  CL 101 101  CO2 22 24  GLUCOSE 292* 222*  BUN 17 15  CREATININE 0.87 0.81  CALCIUM 9.7 9.9  MG  --  2.0    Lipid Panel:     Component Value Date/Time   CHOL 172 10/22/2016 1425   TRIG 157 (H) 10/22/2016 1425   HDL 51 10/22/2016 1425   CHOLHDL 3.4 10/22/2016 1425   VLDL 31 10/22/2016 1425   LDLCALC 90 10/22/2016 1425   HgbA1c:  Lab Results  Component Value Date   HGBA1C 8.5 (H) 10/22/2016   Urine Drug Screen:      Component Value Date/Time   LABOPIA NONE DETECTED 10/20/2016 1437   COCAINSCRNUR NONE DETECTED 10/20/2016 1437   LABBENZ NONE DETECTED 10/20/2016 1437   AMPHETMU NONE DETECTED 10/20/2016 1437   THCU NONE DETECTED 10/20/2016 1437   LABBARB NONE DETECTED 10/20/2016 1437      IMAGING  Dg Abd Portable 1v  Result Date: 10/24/2016 CLINICAL DATA:  Evaluate NG tube placement EXAM: PORTABLE ABDOMEN - 1 VIEW COMPARISON:  KUB of 10/23/2016 FINDINGS: The tip of the NG tube is within the proximal to mid body of the stomach. The bowel gas pattern is nonspecific. No opaque calculi are seen. IMPRESSION: Tip of NG tube overlies the mid proximal stomach. Electronically Signed   By: Ivar Drape M.D.   On: 10/24/2016 10:54   Dg Abd Portable 1v  Result Date: 10/23/2016 CLINICAL DATA:  NG tube advanced. EXAM: PORTABLE ABDOMEN - 1 VIEW COMPARISON:  10/20/2016 and 10/23/2016 FINDINGS: NG tube has been advanced with tip overlying the stomach just left of midline and side-port 9 just below the expected region of the gastroesophageal junction. Bowel gas pattern is nonobstructive. Remainder of the exam is unchanged. IMPRESSION: Nonobstructive bowel gas pattern. Interval advancement of the nasogastric tube with tip over the stomach just left of midline and side-port  now just below the expected region of the gastroesophageal junction. Electronically Signed   By: Marin Olp M.D.   On: 10/23/2016 18:40   Dg Abd Portable 1v  Result Date: 10/23/2016 CLINICAL DATA:  Verify nasogastric tube placement. EXAM: PORTABLE ABDOMEN - 1 VIEW COMPARISON:  Abdominal radiograph dated October 22, 2016 FINDINGS: The proximal port of the nasogastric tube projects just above the GE junction. The tip projects in the gastric cardia. There are surgical clips in the gallbladder fossa. There are is contrast within the renal collecting systems bilaterally. There are mild degenerative spurs of the lumbar spine. IMPRESSION: The nasogastric  tubes proximal port lies just above the GE junction. Advancement of the nasogastric tube by at least 10 cm is recommended to assure that both the proximal port and tip lie below the GE junction. Electronically Signed   By: David  Martinique M.D.   On: 10/23/2016 15:50   Ir Angio Intra Extracran Sel Com Carotid Innominate Bilat Mod Sed  Result Date: 10/24/2016 CLINICAL DATA:  Confusion. Bilateral lower extremity weakness. Slow to respond. Abnormal MRI of the brain. EXAM: BILATERAL COMMON CAROTID AND INNOMINATE ANGIOGRAPHY AND BILATERAL VERTEBRAL ARTERY ANGIOGRAMS PROCEDURE: Contrast: Isovue 300 approximately 60 mL. Anesthesia/Sedation:  Conscious sedation. Medications: Versed 1 mg IV. Fentanyl 25 mcg IV. Labetalol 5 mg IV. Hydralazine 15 mg IV. Following a full explanation of the procedure along with the potential associated complications, an informed witnessed consent was obtained. The right groin was prepped and draped in the usual sterile fashion. Thereafter using modified Seldinger technique, transfemoral access into the right common femoral artery was obtained without difficulty. Over a 0.035 inch guidewire, a 5 French Pinnacle sheath was inserted. Through this, and also over 0.035 inch guidewire, a 5 [place] JB 1 catheter was advanced to the aortic arch region and selectively positioned in the right common carotid artery, the right vertebral artery, the left common carotid artery and the left vertebral artery. There were no acute complications. The patient tolerated the procedure well. FINDINGS: The right vertebral arteriogram demonstrates 85% to 90% stenosis of the codominant right vertebral artery origin. The vessel distal to this is seen to opacify to the cranial skull base. Wide patency is seen of the right vertebrobasilar junction and the right posterior-inferior cerebellar artery. The basilar artery is seen to opacify as are the posterior cerebral arteries, superior cerebellar arteries and the  anterior-inferior cerebellar arteries into the capillary and venous phases. Non-opacified blood is seen in the basilar artery from the contralateral vertebral artery. Also demonstrated is retrograde opacification via the right posterior communicating artery of the right internal carotid artery supraclinoid segment and subsequently the right MCA distribution. 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 The right common carotid arteriogram demonstrates the right external carotid artery and its major branches to be widely patent. The right internal carotid artery proximally at the bulb demonstrates a string sign pre occlusive stenosis with slow ascent of contrast in the diminutive right internal carotid artery to the cranial skull base. Slow ascent is noted into the petrous and cavernous segments where there is complete angiographic occlusion at the level of the ophthalmic artery. There is no reconstitution of the intracranial vasculature from the external carotid artery branches. The left common carotid arteriogram demonstrates the left external carotid artery and its major branches to  be widely patent. The left internal carotid artery demonstrates a proximal stenosis at the bulb of approximately 70-80% with slow ascent of contrast distally to the cranial skull base. Opacification is noted into a diminutive distal cervical left ICA extending into the petrous cavernous segment. Again noted is complete angiographic occlusion of the cavernous segment of the left internal carotid artery at the level of the ophthalmic artery. There is no intracranial reconstitution from the external carotid artery branches. The left vertebral artery origin is normal. The vessel is seen to opacify to  the cranial skull base. Wide patency is seen of the left vertebrobasilar junction and the left posterior inferior cerebellar artery. The basilar artery, the posterior cerebral arteries, superior cerebellar arteries and the anterior-inferior cerebellar arteries opacify normally into the capillary and venous phases. Non-opacified blood is seen in the basilar artery from the contralateral vertebral artery. Also noted is prompt retrograde opacification via the right posterior communicating artery of the supraclinoid right ICA with a focal area of stenoses. The delayed arterial phase demonstrates retrograde opacification of the right and left anterior cerebral arteries distal to the A2 regions of the pericallosal and the callosal marginal branches. There is reconstitution from the P3 leptomeningeal branches of the pericallosal and the callosal marginal branches and also from the thalamic perforators from the P2 regions of the posterior cerebral artery on the left. The delayed capillary phase demonstrates persistent retrograde opacification of the periinsular branches of the left middle cerebral artery with occlusion of the left middle cerebral artery proximal to the bifurcation branches. 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 Pre occlusive string sign of the right internal carotid artery proximally extending to the occlusion in the cavernous segment as described above. Approximally 70% stenoses of the left internal carotid artery at the bulb again with a diminutive left internal carotid artery distal to this to its occluded cavernous segment. Approximately 85-90% stenoses at the origin of the right vertebral artery. Retrograde opacification of the anterior cerebral artery distribution bilaterally from the P3 leptomeningeal branches, and from the thalamic  perforators from the P2 segment of the posterior cerebral arteries. Occluded pericallosal and callosal marginal branches of the anterior cerebral arteries at the level of the head of the corpus callosum. Electronically Signed   By: Luanne Bras M.D.   On: 10/23/2016 17:53   EEG   Diffuse slow activity, max over left temporal regions some improvement over the course of the recording.   PHYSICAL EXAM Middle-aged Caucasian male who is not in distress. . Afebrile. Head is nontraumatic. Neck is supple without bruit.    Cardiac exam no murmur or gallop. Lungs are clear to auscultation. Distal pulses are well felt. Neurological Exam : Patient is stuporous. In response to sternal rub purposeful activity on the right. Eyes are closed. Pupils are equal 4 mm reactive. Fundi could not be visualized. Vision acuity and fields cannot be tested reliably. Patient moves right side purposefully against gravity without weakness. He has mild left-sided weakness but is able to move left arm and left leg against gravity but not to the same degree as the right side. He does not follow any commands. He moans and groans but doesn't does not have any spontaneous speech and does not follow commands. Deep tendon reflexes are 2+ symmetric.  Plantars have an ongoing response ASSESSMENT/PLAN Mr. Marshell Rotenberg is a 50 y.o. male with history of  newly diagnosed R frontal mass, DM2, ETOH abuse HLD and HTN  presenting with confusion, L sided numbness and tingling x 2 weeks, neuro consulted for possible seizure.   Stroke:  Progressive bilateral anterior circulation infarcts in setting of severe bilateral ICA disease - Has uncontrolled DM II  R anterior circulation white matter  subacute infarct  1-2 weeks ago New L MCA/ACA watershed infarct   Resultant  R sided weakness, lethargy, not following commands, dysphagia.   MRI head  Progressive B anterior circulation infarcts, L MCA/ACA infarct.  MRI CS mild to moderate degenerative  cervical foraminal stenosis  CTA head and neck  Functional occlusion B ICAs with reconstitution.  2D Echo  pending   LDL 90  HgbA1c 8.5  SCDs for VTE prophylaxis Diet NPO time specified Except for: Sips with Meds  No antithrombotic prior to admission, now on aspirin 325 mg daily and clopidogrel 75 mg daily  Ongoing aggressive stroke risk factor management  Taper steroids underway  Therapy recommendations:  pending   Disposition:  pending (lives w/ wife, works as a [employer] examiner)  ?seizure - more likely tremor, EEG negative for seizure activities.   On valproic acid, level is therapeutic  Consider tapering off.   Carotid Disease, bilateral aniogram done by Dr. Estanislado Pandy on 11/27 1. 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 3.Approx 80 to 85 % stenosis of RT VA at origin. 4.Bilaterally occluded pericallosal and callosal margin arteries with post ant reconstitution from P3 leptomeningeal collateralls and post thalamo perforaters  Unusual presentation  Likely atherosclerosis with his uncontrolled DM II  Plan for vertebral stenting in 2 weeks  Hypertension Permissive hypertension (OK if < 220/120)   Long-term BP goal normotensive  Hyperlipidemia  Home meds:  lipitor 10  Lipitor increased to 80 in hospital  LDL 90, goal < 70  Continue statin at discharge  Diabetes  HgbA1c 8.5.  Treatment per primary team.   Alcohol Abuse  Heavy drinker per wife  Recent fall on 11/9 after birthday party  [place] again last week  CIWA protocol  Dysphagia  Secondary to stroke  Placed NG for aspriin and plavix  Other Stroke Risk Factors  Cigarette smoker, advised to stop smoking  Overweight, Body mass index is 29.66 kg/m., recommend weight loss, diet and exercise as appropriate  obstructive sleep apnea, inconsistent use of CPAP at home   Hospital day # 3  Dellia Nims M.D. PGY-3  I have  personally examined this patient, reviewed notes, independently viewed imaging studies, participated in medical decision making and plan of care.ROS completed by me personally and pertinent positives fully documented  I have made any additions or clarifications directly to the above note. Agree with note above. Patient's condition remains unchanged without significant improvement or worsening. Long-term EEG does not show any electrographic seizures. Recommend discontinue sedation and continue supportive care. Long discussion at the bedside with the patient's wife and father and answered questions. This patient is critically ill and at significant risk of neurological worsening, death and care requires constant monitoring of vital signs, hemodynamics,respiratory and cardiac monitoring, extensive review of multiple databases, frequent neurological assessment, discussion with family, other specialists and medical decision making of high complexity.I have made any additions or clarifications directly to the above note.This critical care time does not reflect procedure time, or teaching time or supervisory time of PA/NP/Med Resident etc but could involve care discussion time.  I spent 30 minutes of neurocritical  care time  in the care of  this patient.      Antony Contras, MD Medical Director [facility] Pager: [phone] 10/24/2016 3:09 PM

## 2016-10-24 NOTE — Progress Notes (Signed)
Referring Physician(s): Dr. Antony Contras  Supervising Physician: Luanne Bras  Patient Status:  [facility] - In-pt  Chief Complaint:  Stroke = S/P S/P 4 vessel cerebral  Arteriogram.  Subjective:  Patient sedated. Family at bedside.  Findings from Arteriogram done yesterday: 1. 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 3.Approx 80 to 85 % stenosis of RT VA at origin. 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  Medications: Prior to Admission medications   Medication Sig Start Date End Date Taking? Authorizing Provider  atorvastatin (LIPITOR) 10 MG tablet Take 5 mg by mouth daily.   Yes Historical Provider, MD  CIALIS 5 MG tablet TAKE ONE TABLET (5 MG TOTAL) BY MOUTH DAILY AS NEEDED FOR ERECTILE DYSFUNCTION. 08/17/16  Yes Historical Provider, MD  dexamethasone (DECADRON) 4 MG tablet Take 2 tablets (8 mg total) by mouth 2 (two) times daily. 10/20/16  Yes Charlesetta Shanks, MD  FLUoxetine (PROZAC) 20 MG capsule Take 20 mg by mouth every morning.  09/04/16  Yes Historical Provider, MD  gabapentin (NEURONTIN) 300 MG capsule Take 600 mg by mouth at bedtime.   Yes Historical Provider, MD  ibuprofen (ADVIL,MOTRIN) 200 MG tablet Take 400 mg by mouth every 4 (four) hours as needed for moderate pain.   Yes Historical Provider, MD  lansoprazole (PREVACID) 30 MG capsule Take 30 mg by mouth every morning.  06/19/16  Yes Historical Provider, MD  levETIRAcetam (KEPPRA) 500 MG tablet Take 1 tablet (500 mg total) by mouth 2 (two) times daily. 10/20/16  Yes Charlesetta Shanks, MD  lisinopril (PRINIVIL,ZESTRIL) 2.5 MG tablet Take 2.5 mg by mouth daily.   Yes Historical Provider, MD  methocarbamol (ROBAXIN) 500 MG tablet Can take up to 1-2 tabs every 6 hours PRN PAIN Patient taking  differently: Take 500-1,000 mg by mouth every 6 (six) hours as needed for muscle spasms.  10/13/16  Yes Nicole Pisciotta, PA-C  omeprazole (PRILOSEC) 20 MG capsule Take 1 capsule (20 mg total) by mouth daily. 10/20/16  Yes Charlesetta Shanks, MD  oxyCODONE-acetaminophen (PERCOCET) 5-325 MG tablet Take 1 tablet by mouth every 4 (four) hours as needed. Patient taking differently: Take 1 tablet by mouth every 4 (four) hours as needed for severe pain.  10/13/16  Yes Nicole Pisciotta, PA-C  SitaGLIPtin-MetFORMIN HCl 50-1000 MG TB24 Take 1 tablet by mouth every morning. 06/19/16  Yes Historical Provider, MD  TESTIM 50 MG/5GM (1%) GEL APPLY 1 PACKET ONTO THE SKIN EACH MORNING TO SHOULDER AND UPPER ARM ALLOW TO DRY 5 MIN BEFORE DRESSI 09/15/16  Yes Historical Provider, MD     Vital Signs: BP (!) 175/163   Pulse (!) 101   Temp 98.9 F (37.2 C) (Axillary)   Resp (!) 24   Ht 5\' 8"  (1.727 m)   Wt 195 lb 1 oz (88.5 kg)   SpO2 93%   BMI 29.66 kg/m   Physical Exam Sedated Unable to perform adequate neuro exam, although family member reported he was "moving around". Right groin stick site ok, no hematoma or pseudoaneurysm.  Imaging: Ct Angio Head W Or Wo Contrast  Result Date: 10/22/2016 CLINICAL DATA:  50 year old male with evidence of bilateral ICA poor flow or occlusion on brain MRI today. Acute anterior circulation infarcts. Initial encounter. EXAM: CT ANGIOGRAPHY HEAD AND NECK TECHNIQUE: Multidetector CT imaging of the head and neck was performed  using the standard protocol during bolus administration of intravenous contrast. Multiplanar CT image reconstructions and MIPs were obtained to evaluate the vascular anatomy. Carotid stenosis measurements (when applicable) are obtained utilizing NASCET criteria, using the distal internal carotid diameter as the denominator. CONTRAST:  50 mL Isovue 370 COMPARISON:  Brain MRI 0941 hours today, and earlier. FINDINGS: CTA NECK Skeleton: Largely unremarkable. No  acute osseous abnormality identified. Visualized paranasal sinuses and mastoids are stable and well pneumatized. Upper chest: Mild paraseptal emphysema. Mild dependent pulmonary atelectasis. Incidental azygos fissure. No superior mediastinal lymphadenopathy. Other neck: Negative thyroid. Larynx, pharynx, parapharyngeal spaces, retropharyngeal space, sublingual space, submandibular glands and parotid glands are within normal limits. Visualized orbits and scalp soft tissues are within normal limits. No cervical lymphadenopathy. Aortic arch: 3 vessel arch configuration. Minimal soft plaque in the distal arch. Right carotid system: Normal brachiocephalic and right CCA artery origins. There is low-density soft plaque along the anterior right CCA at the level of the larynx (series 401, image 63) which is not hemodynamically significant. At the right carotid bifurcation there is combined soft and calcified plaque affecting the right ICA origin and bulb, and a radiographic string sign stenosis results throughout the proximal right ICA (series 404, image 82). Thread-like right ICA enhancement continues distally, but the vessel appears to occlude at the level of the skullbase (series 401, image 121). See intracranial findings below. Left carotid system: Negative left CCA origin. No significant plaque in the left CCA. At the left carotid bifurcation there is calcified plaque at the posterior left ICA origin which is not hemodynamically significant. However, distal to the origin the left ICA slowly tapers resulting in a vessel about 1/2 of its normal size below the skullbase (series 406, image 27). There is no focal high-grade stenosis. The vessel remains patent to the skullbase, see below. Vertebral arteries:No proximal left subclavian artery stenosis despite soft and calcified plaque. Normal proximal right subclavian. Normal left vertebral artery origin. There is soft plaque at the right vertebral artery origin resulting in  at least moderate stenosis (series 401, image 39 and series 403, image 143). There is mild calcified plaque in the right V2 segment. No other right vertebral artery stenosis to the skullbase. No left vertebral artery stenosis to the skullbase. CTA HEAD Posterior circulation: Both distal vertebral arteries are patent without stenosis. Normal vertebrobasilar junction. Normal right PICA origin. Dominant appearing left AICA origin. No basilar stenosis. Normal SCA and PCA origins. Both posterior communicating arteries are present, but are diminutive especially the left. Bilateral PCA branches are normal. Anterior circulation: Right ICA siphon is occluded until flow is reconstituted at the level of the right posterior communicating artery origin. There calcified plaque in the occluded left ICA just proximal to the reconstitution. The right ICA terminus is patent but irregular. There is faint thread-like enhancement of the left ICA in the petrous segment. Little if any enhancement of the left siphon in the cavernous and proximal supraclinoid segments. Flow is reconstituted at the level of the left posterior communicating artery origin. The left ICA terminus is patent but irregular. The left ACA A1 segment appears to be dominant. The right is diminutive. The anterior communicating artery and bilateral ACA branches are patent, but somewhat diminutive. Both MCA M1 segments are patent but diminutive. The bilateral MCA bifurcations are patent. No MCA M2 branch occlusion is identified. The bilateral MCA branches are patent but somewhat diminutive. Venous sinuses: Patent on delayed images. Anatomic variants: Dominant left and diminutive right ACA A1 segments.  Delayed phase: Evolving cytotoxic edema in the anterior left frontal lobe, MCA/ACA watershed areas. Progressed right frontal lobe white matter hypodensity corresponding to the more subacute infarct. No abnormal enhancement identified. No acute intracranial hemorrhage  identified. No midline shift, mass effect, or evidence of intracranial mass lesion. Review of the MIP images confirms the above findings IMPRESSION: 1. Functional occlusion of both ICA siphons with suboptimal reconstitution of flow at both ICA termini: - Radiographic string sign stenosis at the right ICA origin in the neck related to bulky low-density plaque or thrombus. The cervical Right ICA demonstrates thread-like enhancement and appears to occlude at the skullbase. I suspect much of the Right ICA siphon is fully occluded. - Tapered appearance of the cervical Left ICA without focal stenosis in the neck. There is thread-like enhancement in the Left ICA siphon, but I suspect the supraclinoid Left ICA segment is fully occluded. Thus the tapering in the neck is likely do to distal outflow obstruction. - Reconstituted flow from the posterior communicating arteries, which unfortunately are diminutive. - No ACA or MCA occlusion identified, but the anterior circulation appears diminutive throughout. 2. Preliminary report of the above was discussed with Dr. Elpidio Anis at 1138 hours. 3. Posterior circulation is largely normal. There does appear to be a moderate or severe atherosclerotic stenosis at the right vertebral artery origin, but no other posterior circulation stenosis. 4. Expected CT appearance of evolving bilateral anterior circulation infarcts. No associated hemorrhage or mass effect. 5. No other abnormality in the neck. Mild emphysema in the lung apices. Electronically Signed   By: Genevie Ann M.D.   On: 10/22/2016 12:24   Ct Head Wo Contrast  Result Date: 10/17/2016 CLINICAL DATA:  50 year old male with seizure, confusion. Abnormal brain MRI yesterday. Subsequent encounter. EXAM: CT HEAD WITHOUT CONTRAST TECHNIQUE: Contiguous axial images were obtained from the base of the skull through the vertex without intravenous contrast. COMPARISON:  10/20/2016 brain MRI and head CT. FINDINGS: Brain: Stable appearance of  the oval 7 cm area of abnormal white matter hypodensity in the anterior right frontal lobe. There was corresponding abnormal signal in the white matter here on MRI but without associated enhancement. No associated hemorrhage, and as before there is no regional mass effect. Gray and white-matter differentiation elsewhere remains normal. No ventriculomegaly.  No acute or chronic cortically based infarct. Vascular: Mild Calcified atherosclerosis at the skull base. No suspicious intracranial vascular hyperdensity. Skull: No osseous abnormality identified. Sinuses/Orbits: Mild right sphenoid sinus mucous retention cyst is stable. Other Visualized paranasal sinuses and mastoids are stable and well pneumatized. Other: Negative orbit and scalp soft tissues. IMPRESSION: Unchanged CT appearance of the brain since yesterday. Stable anterior right frontal lobe white matter lesion without mass effect or hemorrhage which was characterized on MRI without and with contrast yesterday, please see also that report. Electronically Signed   By: Genevie Ann M.D.   On: 09/29/2016 12:53   Ct Head Wo Contrast  Result Date: 10/20/2016 CLINICAL DATA:  Tongue numbness and left facial droop, numbness and tingling in the hands and fingers. EXAM: CT HEAD WITHOUT CONTRAST TECHNIQUE: Contiguous axial images were obtained from the base of the skull through the vertex without intravenous contrast. COMPARISON:  None. FINDINGS: Brain: There is nail hemorrhage identified. There is a moderate-sized focal hypodensity within the right frontal lobe white matter, this measures approximately 5 cm AP on sagittal images by 1.7 cm cranial caudad. No midline shift. The ventricles are nonenlarged. Vascular: No hyperdense vessels.  No unexpected calcifications.  Skull: Mastoid air cells are clear. There is no skull fracture identified. Sinuses/Orbits: Minimal mucosal thickening in the ethmoid and right sphenoid sinus. No acute orbital abnormality. Other: None  IMPRESSION: Focal hypodensity within the white matter of the right frontal lobe, this may represent an area of edema, either related to a mass or possible ischemia. MRI with and without contrast is recommended for further evaluation. Electronically Signed   By: Donavan Foil M.D.   On: 10/20/2016 14:20   Ct Angio Neck W Or Wo Contrast  Result Date: 10/22/2016 CLINICAL DATA:  50 year old male with evidence of bilateral ICA poor flow or occlusion on brain MRI today. Acute anterior circulation infarcts. Initial encounter. EXAM: CT ANGIOGRAPHY HEAD AND NECK TECHNIQUE: Multidetector CT imaging of the head and neck was performed using the standard protocol during bolus administration of intravenous contrast. Multiplanar CT image reconstructions and MIPs were obtained to evaluate the vascular anatomy. Carotid stenosis measurements (when applicable) are obtained utilizing NASCET criteria, using the distal internal carotid diameter as the denominator. CONTRAST:  50 mL Isovue 370 COMPARISON:  Brain MRI 0941 hours today, and earlier. FINDINGS: CTA NECK Skeleton: Largely unremarkable. No acute osseous abnormality identified. Visualized paranasal sinuses and mastoids are stable and well pneumatized. Upper chest: Mild paraseptal emphysema. Mild dependent pulmonary atelectasis. Incidental azygos fissure. No superior mediastinal lymphadenopathy. Other neck: Negative thyroid. Larynx, pharynx, parapharyngeal spaces, retropharyngeal space, sublingual space, submandibular glands and parotid glands are within normal limits. Visualized orbits and scalp soft tissues are within normal limits. No cervical lymphadenopathy. Aortic arch: 3 vessel arch configuration. Minimal soft plaque in the distal arch. Right carotid system: Normal brachiocephalic and right CCA artery origins. There is low-density soft plaque along the anterior right CCA at the level of the larynx (series 401, image 63) which is not hemodynamically significant. At the  right carotid bifurcation there is combined soft and calcified plaque affecting the right ICA origin and bulb, and a radiographic string sign stenosis results throughout the proximal right ICA (series 404, image 82). Thread-like right ICA enhancement continues distally, but the vessel appears to occlude at the level of the skullbase (series 401, image 121). See intracranial findings below. Left carotid system: Negative left CCA origin. No significant plaque in the left CCA. At the left carotid bifurcation there is calcified plaque at the posterior left ICA origin which is not hemodynamically significant. However, distal to the origin the left ICA slowly tapers resulting in a vessel about 1/2 of its normal size below the skullbase (series 406, image 27). There is no focal high-grade stenosis. The vessel remains patent to the skullbase, see below. Vertebral arteries:No proximal left subclavian artery stenosis despite soft and calcified plaque. Normal proximal right subclavian. Normal left vertebral artery origin. There is soft plaque at the right vertebral artery origin resulting in at least moderate stenosis (series 401, image 39 and series 403, image 143). There is mild calcified plaque in the right V2 segment. No other right vertebral artery stenosis to the skullbase. No left vertebral artery stenosis to the skullbase. CTA HEAD Posterior circulation: Both distal vertebral arteries are patent without stenosis. Normal vertebrobasilar junction. Normal right PICA origin. Dominant appearing left AICA origin. No basilar stenosis. Normal SCA and PCA origins. Both posterior communicating arteries are present, but are diminutive especially the left. Bilateral PCA branches are normal. Anterior circulation: Right ICA siphon is occluded until flow is reconstituted at the level of the right posterior communicating artery origin. There calcified plaque in the occluded left ICA  just proximal to the reconstitution. The right ICA  terminus is patent but irregular. There is faint thread-like enhancement of the left ICA in the petrous segment. Little if any enhancement of the left siphon in the cavernous and proximal supraclinoid segments. Flow is reconstituted at the level of the left posterior communicating artery origin. The left ICA terminus is patent but irregular. The left ACA A1 segment appears to be dominant. The right is diminutive. The anterior communicating artery and bilateral ACA branches are patent, but somewhat diminutive. Both MCA M1 segments are patent but diminutive. The bilateral MCA bifurcations are patent. No MCA M2 branch occlusion is identified. The bilateral MCA branches are patent but somewhat diminutive. Venous sinuses: Patent on delayed images. Anatomic variants: Dominant left and diminutive right ACA A1 segments. Delayed phase: Evolving cytotoxic edema in the anterior left frontal lobe, MCA/ACA watershed areas. Progressed right frontal lobe white matter hypodensity corresponding to the more subacute infarct. No abnormal enhancement identified. No acute intracranial hemorrhage identified. No midline shift, mass effect, or evidence of intracranial mass lesion. Review of the MIP images confirms the above findings IMPRESSION: 1. Functional occlusion of both ICA siphons with suboptimal reconstitution of flow at both ICA termini: - Radiographic string sign stenosis at the right ICA origin in the neck related to bulky low-density plaque or thrombus. The cervical Right ICA demonstrates thread-like enhancement and appears to occlude at the skullbase. I suspect much of the Right ICA siphon is fully occluded. - Tapered appearance of the cervical Left ICA without focal stenosis in the neck. There is thread-like enhancement in the Left ICA siphon, but I suspect the supraclinoid Left ICA segment is fully occluded. Thus the tapering in the neck is likely do to distal outflow obstruction. - Reconstituted flow from the posterior  communicating arteries, which unfortunately are diminutive. - No ACA or MCA occlusion identified, but the anterior circulation appears diminutive throughout. 2. Preliminary report of the above was discussed with Dr. Elpidio Anis at 1138 hours. 3. Posterior circulation is largely normal. There does appear to be a moderate or severe atherosclerotic stenosis at the right vertebral artery origin, but no other posterior circulation stenosis. 4. Expected CT appearance of evolving bilateral anterior circulation infarcts. No associated hemorrhage or mass effect. 5. No other abnormality in the neck. Mild emphysema in the lung apices. Electronically Signed   By: Genevie Ann M.D.   On: 10/22/2016 12:24   Mr Brain Wo Contrast  Result Date: 10/22/2016 CLINICAL DATA:  50 year old male presented to the [facility] ED yesterday with 2 weeks of increasing confusion, gait disturbance, and parasthesias. Head CT and brain MRI revealed a large, right frontal white matter lesion without mass effect. He was prescribed 8 mg dexamethasone BID, Keppra, 500 mg BID, and discharged since his neurologic status was grossly normal and he was in no acute distress. Overnight after leaving the the ED he symptoms worsened with slurred speech, confusion, lethargy, and intermittent flailing movements of his left arm. His condition has rapidly deteriorated; at Thanksgiving, he was less talkative then normal, seemed somewhat distracted, and was slurring his speech slightly. He was totally normal 3 weeks ago, with insidious and progressive symptoms until rapid changes in last 2 days. Subsequent encounter. EXAM: MRI HEAD WITHOUT CONTRAST TECHNIQUE: Multiplanar, multiecho pulse sequences of the brain and surrounding structures were obtained without intravenous contrast. COMPARISON:  Head CT without contrast 10/20/2016, brain MRI without and with contrast 10/20/2016. FINDINGS: Brain: New gray and white matter restricted diffusion in the  anterior left frontal  lobe, involving left MCA/ ACA watershed territory. Small foci of restricted diffusion in the left basal ganglia. Mildly enlarged area of anterior right frontal lobe white matter restricted diffusion which was initially present on 10/20/2016. Occasional other small scattered cortically based areas of restricted diffusion in the right superior frontal gyrus. Associated increased T2 and FLAIR hyperintensity, in the anterior left frontal lobe most resembling cytotoxic edema. No evidence of associated hemorrhage. No significant intracranial mass effect. No thalamic or posterior posterior circulation restricted diffusion. Basilar cisterns remain patent. No ventriculomegaly. Signal in the right basal ganglia, thalami, brainstem, and cerebellum remains normal. Vascular: Development of abnormal bilateral ICA flow voids, both are lost in the distal neck and through the ICA siphons but with evidence of some reconstituted flow at the MCA and ACA origins. The vertebrobasilar flow voids remain normal. Skull and upper cervical spine: Negative. Normal bone marrow signal. Sinuses/Orbits: Stable. Other: Stable scalp soft tissues. IMPRESSION: 1. Emergent Anterior Circulation Large Vessel Occlusions - both Internal Carotid Arteries likely affected. Progressive bilateral anterior circulation infarcts, newly involving the left MCA/ACA territories. 2. No associated hemorrhage or mass effect at this time. 3. This was discussed by telephone with Dr. Elpidio Anis On 10/22/2016 at 1037 hours. We are arranging for the patient to undergo stat CTA head and neck at the time of this dictation. Electronically Signed   By: Genevie Ann M.D.   On: 10/22/2016 10:43   Mr Jeri Cos [id number] Contrast  Result Date: 10/20/2016 CLINICAL DATA:  Left facial droop.  Tingling and numbness. EXAM: MRI HEAD WITHOUT AND WITH CONTRAST TECHNIQUE: Multiplanar, multiecho pulse sequences of the brain and surrounding structures were obtained without and with intravenous contrast.  CONTRAST:  27mL MULTIHANCE GADOBENATE DIMEGLUMINE 529 MG/ML IV SOLN COMPARISON:  Head CT 10/20/2016 FINDINGS: Brain: There is a focal area of diffusion restriction and hyperintense T2 weighted signal within the white matter of the right frontal lobe, measuring approximately 3.5 x 1.6 cm. There is no associated contrast enhancement.There is no associated midline shift or mass effect. No other focal lesions are identified. There is no evidence of intracranial hemorrhage. Vascular: The major intracranial flow voids are preserved. Skull and upper cervical spine: Calvarium and visualized upper cervical spine are normal. Sinuses/Orbits: Normal sinuses and orbits. Other: None IMPRESSION: 1. Area of diffusion restriction and hyperintense T2 weighted signal within the right frontal white matter. The distribution is not typical of ischemia and the appearance is concerning for a neoplastic process, such as a low grade glioma. Tumefactive demyelinating disease is also a consideration, though if the patient's symptoms are truly acute, some degree of contrast enhancement would be expected. Short-term follow-up with brain MRI with and without contrast in 4-6 weeks is recommended, as change over time may be helpful in diagnosis. Additionally, CSF sampling should be considered. 2. No hemorrhage or mass effect. Electronically Signed   By: Ulyses Jarred M.D.   On: 10/20/2016 17:12   Mr Cervical Spine Wo Contrast  Result Date: 10/22/2016 CLINICAL DATA:  50 year old male with deteriorating neurologic status following initial presentation on 10/20/2016 with right frontal lobe white matter lesion. Initial encounter. EXAM: MRI CERVICAL SPINE WITHOUT CONTRAST TECHNIQUE: Multiplanar, multisequence MR imaging of the cervical spine was performed. No intravenous contrast was administered. COMPARISON:  Brain MRI without contrast today reported separately. FINDINGS: Study is intermittently degraded by motion artifact despite repeated imaging  attempts. Alignment: Mild straightening of cervical lordosis. Vertebrae: No marrow edema or evidence of acute osseous abnormality. Cord:  Spinal cord signal is within normal limits at all visualized levels. Posterior Fossa, vertebral arteries, paraspinal tissues: Cervicomedullary junction is within normal limits. Negative visualized brain parenchyma. While the vertebral artery flow voids appear preserved throughout the neck, the carotid flow voids seem to be absent distal to the carotid bifurcations. Disc levels: No cervical spinal stenosis. Mild for age cervical degeneration. There is more mild to moderate facet and uncovertebral hypertrophy in the cervical spine resulting in mild or moderate neural foraminal stenosis at the bilateral C4, left greater than right C5, and bilateral C6 nerve levels. IMPRESSION: 1. Abnormal brain MRI today reported separately. 2. Mild for age cervical spine degeneration. No cervical spinal stenosis or spinal cord abnormality. Mild or up to moderate degenerative cervical foraminal stenosis. Electronically Signed   By: Genevie Ann M.D.   On: 10/22/2016 11:04   Dg Abd Portable 1v  Result Date: 10/23/2016 CLINICAL DATA:  NG tube advanced. EXAM: PORTABLE ABDOMEN - 1 VIEW COMPARISON:  10/12/2016 and 10/23/2016 FINDINGS: NG tube has been advanced with tip overlying the stomach just left of midline and side-port 9 just below the expected region of the gastroesophageal junction. Bowel gas pattern is nonobstructive. Remainder of the exam is unchanged. IMPRESSION: Nonobstructive bowel gas pattern. Interval advancement of the nasogastric tube with tip over the stomach just left of midline and side-port now just below the expected region of the gastroesophageal junction. Electronically Signed   By: Marin Olp M.D.   On: 10/23/2016 18:40   Dg Abd Portable 1v  Result Date: 10/23/2016 CLINICAL DATA:  Verify nasogastric tube placement. EXAM: PORTABLE ABDOMEN - 1 VIEW COMPARISON:  Abdominal  radiograph dated October 22, 2016 FINDINGS: The proximal port of the nasogastric tube projects just above the GE junction. The tip projects in the gastric cardia. There are surgical clips in the gallbladder fossa. There are is contrast within the renal collecting systems bilaterally. There are mild degenerative spurs of the lumbar spine. IMPRESSION: The nasogastric tubes proximal port lies just above the GE junction. Advancement of the nasogastric tube by at least 10 cm is recommended to assure that both the proximal port and tip lie below the GE junction. Electronically Signed   By: David  Martinique M.D.   On: 10/23/2016 15:50   Dg Abd Portable 1v  Result Date: 10/22/2016 CLINICAL DATA:  Check nasogastric catheter placement EXAM: PORTABLE ABDOMEN - 1 VIEW COMPARISON:  None. FINDINGS: Scattered large and small bowel gas is noted. Nasogastric catheter is noted within the stomach. No bony abnormality is seen. Contrast from recent CT is noted. IMPRESSION: Nasogastric catheter within the stomach. Electronically Signed   By: Inez Catalina M.D.   On: 10/22/2016 15:11   Ir Angio Intra Extracran Sel Com Carotid Innominate Bilat Mod Sed  Result Date: 10/24/2016 CLINICAL DATA:  Confusion. Bilateral lower extremity weakness. Slow to respond. Abnormal MRI of the brain. EXAM: BILATERAL COMMON CAROTID AND INNOMINATE ANGIOGRAPHY AND BILATERAL VERTEBRAL ARTERY ANGIOGRAMS PROCEDURE: Contrast: Isovue 300 approximately 60 mL. Anesthesia/Sedation:  Conscious sedation. Medications: Versed 1 mg IV. Fentanyl 25 mcg IV. Labetalol 5 mg IV. Hydralazine 15 mg IV. Following a full explanation of the procedure along with the potential associated complications, an informed witnessed consent was obtained. The right groin was prepped and draped in the usual sterile fashion. Thereafter using modified Seldinger technique, transfemoral access into the right common femoral artery was obtained without difficulty. Over a 0.035 inch guidewire, a 5  French Pinnacle sheath was inserted. Through this, and also over 0.035  inch guidewire, a 5 [place] JB 1 catheter was advanced to the aortic arch region and selectively positioned in the right common carotid artery, the right vertebral artery, the left common carotid artery and the left vertebral artery. There were no acute complications. The patient tolerated the procedure well. FINDINGS: The right vertebral arteriogram demonstrates 85% to 90% stenosis of the codominant right vertebral artery origin. The vessel distal to this is seen to opacify to the cranial skull base. Wide patency is seen of the right vertebrobasilar junction and the right posterior-inferior cerebellar artery. The basilar artery is seen to opacify as are the posterior cerebral arteries, superior cerebellar arteries and the anterior-inferior cerebellar arteries into the capillary and venous phases. Non-opacified blood is seen in the basilar artery from the contralateral vertebral artery. Also demonstrated is retrograde opacification via the right posterior communicating artery of the right internal carotid artery supraclinoid segment and subsequently the right MCA distribution. 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 The right common carotid arteriogram demonstrates the right external carotid artery and its major branches to be widely patent. The right internal carotid artery proximally at the bulb demonstrates a string sign pre occlusive stenosis with slow ascent of contrast in the diminutive right internal carotid artery to the cranial skull base. Slow ascent is noted into the petrous and cavernous segments where there is complete angiographic occlusion at the level of the ophthalmic artery. There is no  reconstitution of the intracranial vasculature from the external carotid artery branches. The left common carotid arteriogram demonstrates the left external carotid artery and its major branches to be widely patent. The left internal carotid artery demonstrates a proximal stenosis at the bulb of approximately 70-80% with slow ascent of contrast distally to the cranial skull base. Opacification is noted into a diminutive distal cervical left ICA extending into the petrous cavernous segment. Again noted is complete angiographic occlusion of the cavernous segment of the left internal carotid artery at the level of the ophthalmic artery. There is no intracranial reconstitution from the external carotid artery branches. The left vertebral artery origin is normal. The vessel is seen to opacify to the cranial skull base. Wide patency is seen of the left vertebrobasilar junction and the left posterior inferior cerebellar artery. The basilar artery, the posterior cerebral arteries, superior cerebellar arteries and the anterior-inferior cerebellar arteries opacify normally into the capillary and venous phases. Non-opacified blood is seen in the basilar artery from the contralateral vertebral artery. Also noted is prompt retrograde opacification via the right posterior communicating artery of the supraclinoid right ICA with a focal area of stenoses. The delayed arterial phase demonstrates retrograde opacification of the right and left anterior cerebral arteries distal to the A2 regions of the pericallosal and the callosal marginal branches. There is reconstitution from the P3 leptomeningeal branches of the pericallosal and the callosal marginal branches and also from the thalamic perforators from the P2 regions of the posterior cerebral artery on the left. The delayed capillary phase demonstrates persistent retrograde opacification of the periinsular branches of the left middle cerebral artery with occlusion of the left middle  cerebral artery proximal to the bifurcation branches. 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 Pre occlusive string  sign of the right internal carotid artery proximally extending to the occlusion in the cavernous segment as described above. Approximally 70% stenoses of the left internal carotid artery at the bulb again with a diminutive left internal carotid artery distal to this to its occluded cavernous segment. Approximately 85-90% stenoses at the origin of the right vertebral artery. Retrograde opacification of the anterior cerebral artery distribution bilaterally from the P3 leptomeningeal branches, and from the thalamic perforators from the P2 segment of the posterior cerebral arteries. Occluded pericallosal and callosal marginal branches of the anterior cerebral arteries at the level of the head of the corpus callosum. Electronically Signed   By: Luanne Bras M.D.   On: 10/23/2016 17:53    Labs:  CBC:  Recent Labs  10/20/16 1322 10/20/16 1332 09/29/2016 0806 10/22/16 0404  WBC 7.7  --  5.7 10.4  HGB 16.5 16.3 16.3 16.1  HCT 45.6 48.0 45.7 45.3  PLT 264  --  252 276    COAGS:  Recent Labs  10/20/16 1322  INR 0.89  APTT 26    BMP:  Recent Labs  10/20/16 1322 10/20/16 1332 10/15/2016 0806 10/22/16 0404  NA 134* 135 133* 137  K 4.4 4.6 4.6 4.4  CL 101 101 101 101  CO2 23  --  22 24  GLUCOSE 214* 209* 292* 222*  BUN 14 16 17 15   CALCIUM 9.7  --  9.7 9.9  CREATININE 0.81 0.80 0.87 0.81  GFRNONAA >60  --  >60 >60  GFRAA >60  --  >60 >60    LIVER FUNCTION TESTS:  Recent Labs  10/20/16 1322  BILITOT 0.7  AST 42*  ALT 97*  ALKPHOS 78  PROT 8.1  ALBUMIN 4.9    Assessment and Plan:  S/P Cerebral arteriogram by Dr. Estanislado Pandy  Findings. 1. 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 3.Approx 80 to 85 % stenosis of RT VA at origin. 4.Bilaterally occluded pericallosal and callosal margin arteries with post ant reconstitution from P3 leptomeningeal collateralls and post thalamo perforaters  Will plan for Vertebral stenting in 2 weeks.   Patient will need to start Plavix 75 mg and ASA 325 mg 5 days prior to procedure.  Electronically Signed: Murrell Redden PA-C 10/24/2016, 10:50 AM   I spent a total of 15 Minutes at the the patient's bedside AND on the patient's hospital floor or unit, greater than 50% of which was counseling/coordinating care for f/u after cerebral angiogram.

## 2016-10-24 NOTE — Progress Notes (Signed)
LTM taken down; no skin breakdown was seen. 

## 2016-10-24 NOTE — Progress Notes (Signed)
Subjective: No acute events overnight.    Father and wife report that he has opened his eyes, tried to sit up, and moved spontaneous intermittently overnight.  Had friends visit and responding to them with gestures.  Father is present at the bedside, provides family history of CAD with uncle with MI in his 23s and pGF with MI in 28s.  Objective:  Vital signs in last 24 hours: Vitals:   10/24/16 0100 10/24/16 0307 10/24/16 0406 10/24/16 0700  BP: (!) 195/109 (!) 193/104  (!) 166/88  Pulse: 92 (!) 101  91  Resp: (!) 26 (!) 21  (!) 22  Temp:  99 F (37.2 C)  98.9 F (37.2 C)  TempSrc:  Axillary  Axillary  SpO2: 96% 95%  93%  Weight:   195 lb 1 oz (88.5 kg)   Height:       Physical Exam  Constitutional:  Lying in bed in no distress with NG tube in place  HENT:  Head: Normocephalic and atraumatic.  Cardiovascular: Normal rate and regular rhythm.   Pulmonary/Chest: Effort normal and breath sounds normal.  Neurological:  GCS 8 (E1V2M5) PERRL Moves all 4 extremities spontaneous and inconsistently on command   MRI Brain without Contrast 10/22/2016 IMPRESSION: 1. Emergent Anterior Circulation Large Vessel Occlusions - both Internal Carotid Arteries likely affected. Progressive bilateral anterior circulation infarcts, newly involving the left MCA/ACA territories. 2. No associated hemorrhage or mass effect at this time. 3. This was discussed by telephone with Dr. Elpidio Anis On 10/22/2016 at 1037 hours. We are arranging for the patient to undergo stat CTA head and neck at the time of this dictation.  CTA Head and Neck 10/22/2016  IMPRESSION: 1. Functional occlusion of both ICA siphons with suboptimal reconstitution of flow at both ICA termini:  - Radiographic string sign stenosis at the right ICA origin in the neck related to bulky low-density plaque or thrombus. The cervical Right ICA demonstrates thread-like enhancement and appears to occlude at the skullbase. I suspect  much of the Right ICA siphon is fully occluded.  - Tapered appearance of the cervical Left ICA without focal stenosis in the neck. There is thread-like enhancement in the Left ICA siphon, but I suspect the supraclinoid Left ICA segment is fully occluded. Thus the tapering in the neck is likely do to distal outflow obstruction.  - Reconstituted flow from the posterior communicating arteries, which unfortunately are diminutive.  - No ACA or MCA occlusion identified, but the anterior circulation appears diminutive throughout.  2. Preliminary report of the above was discussed with Dr. Elpidio Anis at 1138 hours. 3. Posterior circulation is largely normal. There does appear to be a moderate or severe atherosclerotic stenosis at the right vertebral artery origin, but no other posterior circulation stenosis. 4. Expected CT appearance of evolving bilateral anterior circulation infarcts. No associated hemorrhage or mass effect. 5. No other abnormality in the neck. 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  Anterior and Posterior Circulation Catheter Angiogram 10/23/2016 1. 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 3.Approx 80 to 85 % stenosis of RT VA at origin. 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  Lipid Panel     Component Value Date/Time   CHOL 172 10/22/2016 1425   TRIG 157 (H) 10/22/2016 1425   HDL 51 10/22/2016 1425   CHOLHDL 3.4 10/22/2016 1425   VLDL 31 10/22/2016 1425  Henning 90 10/22/2016 1425   Lab Results  Component Value Date   HGBA1C 8.5 (H) 10/22/2016    Assessment/Plan:  Principal Problem:   CVA (cerebral vascular accident) ([place]) Active Problems:   Essential hypertension with goal blood pressure less than 130/80   Type 2 diabetes mellitus with complication (HCC)   Altered mental  status   Alcoholism ([place])   Seizures (HCC)   Cerebral thrombosis with cerebral infarction  50 year old man with acute on subacute neurologic impairments and imaging findings initially thought to be due to a right frontal brain mass now discovered to have bilateral CVAs secondary to bilateral ICA occlusions.  He is not a candidate for revascularization due to total occlusion of intracranial ICAs.  #Bilateral Frontal Strokes #Bilateral ICA Occlusion -Stroke team following -Vascular surgery consulted -Aspirin 325 mg daily -Plavix 75 mg daily -Atorvastatin 80 mg daily -Valproate 500 mg TID -Continuous vEEG -Echo  #HTN Permissive hypertension, goal 130-220/110. -Hold lisinopril  #DM A1c 8.5%.  Was on sitagliptin-metformin 50-1000 mg once daily at home. -Lantus 15U daily  #FEN -Enteral nutrition and hydration by NG tube per RD  Dispo: Anticipated discharge contingent on clinical course.  Minus Liberty, MD 10/24/2016, 10:08 AM Pager: [phone]

## 2016-10-24 NOTE — Procedures (Signed)
LTM-EEG Report  HISTORY: Continuous video-EEG monitoring performed for 50 year old with possible right frontal brain mass and bilateral cortical strokes. ACQUISITION: International 10-20 system for electrode placement; 18 channels with additional eyes linked to ipsilateral ears and EKG. Additional T1-T2 electrodes were used. Continuous video recording obtained.   EEG NUMBER:  MEDICATIONS:  Day 1: VPA  DAY #1: from 1728 10/23/16 to 0700 10/24/16  BACKGROUND: An overall medium voltage continuous recording with good spontaneous variability and reactivity. Waking background consisted of an intermittent medium voltage 7 Hz posterior dominant rhythm bilaterally with sparse low voltage beta activity in the bilateral frontocentral regions and some medium voltage theta activity diffusely. State changes were captured but without clear sleep architecture.   ABNORMAL ACTIVITY: There was initially nearly continuous high-voltage semirhythmic1-3 Hz activity diffusely and intermixed with the waking background.  This was maximal over the left temporal regions at times.   Over the course of the recording, this slow activity became more intermittent and was present and short-term runs.  There was some improvement in the waking background over the course of the recording.  EPILEPTIFORM/PERIODIC ACTIVITY: none SEIZURES: none EVENTS: none EKG: no significant arrhythmia  SUMMARY: This was a moderately abnormal continuous video EEG due to diffuse slow activity which was maximal over the left temporal regions at times.  There was some improvement in this slow activity over the course of the recording.  There were no epileptiform discharges or seizures.  This EEG was indicative of a moderately severe diffuse cerebral disturbance which may be maximal over the left hemisphere.

## 2016-10-25 ENCOUNTER — Inpatient Hospital Stay (HOSPITAL_COMMUNITY): Payer: 59

## 2016-10-25 DIAGNOSIS — I6789 Other cerebrovascular disease: Secondary | ICD-10-CM

## 2016-10-25 DIAGNOSIS — E1165 Type 2 diabetes mellitus with hyperglycemia: Secondary | ICD-10-CM

## 2016-10-25 DIAGNOSIS — I63032 Cerebral infarction due to thrombosis of left carotid artery: Secondary | ICD-10-CM

## 2016-10-25 LAB — ECHOCARDIOGRAM COMPLETE
HEIGHTINCHES: 68 in
WEIGHTICAEL: 3051.17 [oz_av]

## 2016-10-25 LAB — BASIC METABOLIC PANEL
Anion gap: 12 (ref 5–15)
BUN: 14 mg/dL (ref 6–20)
CHLORIDE: 104 mmol/L (ref 101–111)
CO2: 23 mmol/L (ref 22–32)
CREATININE: 0.71 mg/dL (ref 0.61–1.24)
Calcium: 9.8 mg/dL (ref 8.9–10.3)
GFR calc non Af Amer: >60 mL/min (ref >60)
Glucose, Bld: 212 mg/dL — ABNORMAL HIGH (ref 65–99)
POTASSIUM: 3.9 mmol/L (ref 3.5–5.1)
Sodium: 139 mmol/L (ref 135–145)

## 2016-10-25 LAB — GLUCOSE, CAPILLARY
GLUCOSE-CAPILLARY: 199 mg/dL — AB (ref 65–99)
GLUCOSE-CAPILLARY: 241 mg/dL — AB (ref 65–99)
GLUCOSE-CAPILLARY: 245 mg/dL — AB (ref 65–99)
Glucose-Capillary: 216 mg/dL — ABNORMAL HIGH (ref 65–99)
Glucose-Capillary: 212 mg/dL — ABNORMAL HIGH (ref 65–99)
Glucose-Capillary: 204 mg/dL — ABNORMAL HIGH (ref 65–99)

## 2016-10-25 LAB — CBC
HEMATOCRIT: 45.1 % (ref 39.0–52.0)
HEMOGLOBIN: 15.9 g/dL (ref 13.0–17.0)
MCH: 31.1 pg (ref 26.0–34.0)
MCHC: 35.3 g/dL (ref 30.0–36.0)
MCV: 88.1 fL (ref 78.0–100.0)
Platelets: 255 10*3/uL (ref 150–400)
RBC: 5.12 MIL/uL (ref 4.22–5.81)
RDW: 12.3 % (ref 11.5–15.5)
WBC: 13.8 10*3/uL — ABNORMAL HIGH (ref 4.0–10.5)

## 2016-10-25 LAB — AMMONIA: Ammonia: 57 umol/L — ABNORMAL HIGH (ref 9–35)

## 2016-10-25 MED ORDER — INSULIN GLARGINE 100 UNIT/ML ~~LOC~~ SOLN
20.0000 [IU] | Freq: Every day | SUBCUTANEOUS | Status: DC
  Administered 2016-10-25: 20 [IU] via SUBCUTANEOUS
  Filled 2016-10-25 (×2): qty 0.2

## 2016-10-25 MED ORDER — PANTOPRAZOLE SODIUM 40 MG PO PACK
40.0000 mg | PACK | Freq: Every day | ORAL | Status: DC
  Administered 2016-10-25 – 2016-10-29 (×4): 40 mg
  Filled 2016-10-25 (×5): qty 20

## 2016-10-25 MED ORDER — LACTULOSE 10 GM/15ML PO SOLN
20.0000 g | Freq: Two times a day (BID) | ORAL | Status: DC
  Administered 2016-10-25 – 2016-10-29 (×10): 20 g via ORAL
  Filled 2016-10-25 (×9): qty 30

## 2016-10-25 MED ORDER — ORAL CARE MOUTH RINSE
15.0000 mL | Freq: Two times a day (BID) | OROMUCOSAL | Status: DC
  Administered 2016-10-25 – 2016-10-29 (×8): 15 mL via OROMUCOSAL

## 2016-10-25 MED ORDER — METHYLPHENIDATE HCL 5 MG PO TABS
5.0000 mg | ORAL_TABLET | Freq: Two times a day (BID) | ORAL | Status: DC
Start: 2016-10-25 — End: 2016-10-26
  Administered 2016-10-25 – 2016-10-26 (×3): 5 mg via ORAL
  Filled 2016-10-25 (×3): qty 1

## 2016-10-25 MED ORDER — CHLORHEXIDINE GLUCONATE 0.12 % MT SOLN
15.0000 mL | Freq: Two times a day (BID) | OROMUCOSAL | Status: DC
  Administered 2016-10-25 – 2016-10-29 (×10): 15 mL via OROMUCOSAL
  Filled 2016-10-25 (×9): qty 15

## 2016-10-25 NOTE — Procedures (Signed)
LTM-EEG Report Day 2  HISTORY: Continuous video-EEG monitoring performed for 50 year old with possible right frontal brain mass and bilateral cortical strokes. ACQUISITION: International 10-20 system for electrode placement; 18 channels with additional eyes linked to ipsilateral ears and EKG. Additional T1-T2 electrodes were used. Continuous video recording obtained.   EEG NUMBER:  MEDICATIONS:  Day 1: VPA Day 2: VPA  DAY #1: from 1728 10/23/16 to 0700 10/24/16  BACKGROUND: An overall medium voltage continuous recording with good spontaneous variability and reactivity. Waking background consisted of an intermittent medium voltage 7 Hz posterior dominant rhythm bilaterally with sparse low voltage beta activity in the bilateral frontocentral regions and some medium voltage theta activity diffusely. State changes were captured but without clear sleep architecture.   ABNORMAL ACTIVITY: There was initially nearly continuous high-voltage semirhythmic1-3 Hz activity diffusely and intermixed with the waking background.  This was maximal over the left temporal regions at times.   Over the course of the recording, this slow activity became more intermittent and was present and short-term runs.  There was some improvement in the waking background over the course of the recording.  EPILEPTIFORM/PERIODIC ACTIVITY: none SEIZURES: none EVENTS: none EKG: no significant arrhythmia   DAY #2: from 0700 10/24/16 to 1630 10/24/16  BACKGROUND: An overall medium voltage continuous recording with good spontaneous variability and reactivity. Waking background consisted of an intermittent medium voltage 7 Hz posterior dominant rhythm bilaterally with sparse low voltage beta activity in the bilateral frontocentral regions and some medium voltage theta activity diffusely. State changes were captured but without clear sleep architecture.   ABNORMAL ACTIVITY: There was occasional runs of medium to high-voltage  semirhythmic1-3 Hz activity diffusely and intermixed with the waking background.  This was maximal over the left temporal regions at times. This was unchanged from the prior day's recording.  EPILEPTIFORM/PERIODIC ACTIVITY: none SEIZURES: none EVENTS: none EKG: no significant arrhythmia  SUMMARY: This was a moderately abnormal continuous video EEG due to diffuse slow activity which was maximal over the left temporal regions at times.  There was some improvement in this slow activity over the course of the recording.  There were no epileptiform discharges or seizures.  This EEG was indicative of a moderately severe diffuse cerebral disturbance which may be maximal over the left hemisphere.

## 2016-10-25 NOTE — Progress Notes (Signed)
Echocardiogram 2D Echocardiogram has been performed.  Aggie Cosier 10/25/2016, 3:00 PM

## 2016-10-25 NOTE — Progress Notes (Signed)
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 Patient was not considered for IV t-PA. Due to late presentation and stroke not initially suspected   SUBJECTIVE (INTERVAL HISTORY) His wife is at the bedside. He continues to be lethargic. No other changes overnight.    OBJECTIVE Temp:  [98.5 F (36.9 C)-99.7 F (37.6 C)] 99.7 F (37.6 C) (11/29 1122) Pulse Rate:  [83-103] 96 (11/29 1122) Cardiac Rhythm: Normal sinus rhythm (11/29 0700) Resp:  [20-28] 22 (11/29 1122) BP: (157-193)/(78-134) 182/97 (11/29 1122) SpO2:  [92 %-97 %] 94 % (11/29 1122) Weight:  [190 lb 11.2 oz (86.5 kg)] 190 lb 11.2 oz (86.5 kg) (11/29 0256)  CBC:  Recent Labs Lab 10/20/16 1322  10/20/2016 0806 10/22/16 0404 10/25/16 0350  WBC 7.7  --  5.7 10.4 13.8*  NEUTROABS 5.0  --  5.0  --   --   HGB 16.5  < > 16.3 16.1 15.9  HCT 45.6  < > 45.7 45.3 45.1  MCV 87.4  --  86.6 87.1 88.1  PLT 264  --  252 276 255  < > = values in this interval not displayed.  Basic Metabolic Panel:   Recent Labs Lab 10/22/16 0404 10/25/16 0350  NA 137 139  K 4.4 3.9  CL 101 104  CO2 24 23  GLUCOSE 222* 212*  BUN 15 14  CREATININE 0.81 0.71  CALCIUM 9.9 9.8  MG 2.0  --     Lipid Panel:     Component Value Date/Time   CHOL 172 10/22/2016 1425   TRIG 157 (H) 10/22/2016 1425   HDL 51 10/22/2016 1425   CHOLHDL 3.4 10/22/2016 1425   VLDL 31 10/22/2016 1425   LDLCALC 90 10/22/2016 1425   HgbA1c:  Lab Results  Component Value Date   HGBA1C 8.5 (H) 10/22/2016   Urine Drug Screen:     Component Value  Date/Time   LABOPIA NONE DETECTED 10/20/2016 1437   COCAINSCRNUR NONE DETECTED 10/20/2016 1437   LABBENZ NONE DETECTED 10/20/2016 1437   AMPHETMU NONE DETECTED 10/20/2016 1437   THCU NONE DETECTED 10/20/2016 1437   LABBARB NONE DETECTED 10/20/2016 1437      IMAGING  Ct Head Wo Contrast  Result Date: 10/25/2016 CLINICAL DATA:  Acute presentation with confusion and bilateral lower extremity weakness. Stroke with intervention. EXAM: CT HEAD WITHOUT CONTRAST TECHNIQUE: Contiguous axial images were obtained from the base of the skull through the vertex without intravenous contrast. COMPARISON:  10/22/2016 FINDINGS: Brain: Persistent low-density throughout the acute bilateral infarctions affecting the right frontal deep white matter in the left frontal cortical and subcortical brain. Involvement of the left basal ganglia appear slightly more pronounced than seen previously. Mild swelling in the region of infarction but no hemorrhage or mass effect resulting in shift. No posterior circulation insult identified. No hydrocephalus or extra-axial collection. Vascular: No vascular finding by CT. Skull: Negative Sinuses/Orbits: Mild sinus mucosal inflammation again demonstrated most notably in the right maxillary sinus. Orbits negative. Other: None significant IMPRESSION: Low-density in the known infarctions affecting the right frontal deep white matter in the left frontal cortical and subcortical brain. No sign of hemorrhagic transformation  or significant mass effect/shift. The seems to be a little more involvement in the left basal ganglia by today's CT. Electronically Signed   By: Nelson Chimes M.D.   On: 10/25/2016 06:34   Dg Abd Portable 1v  Result Date: 10/24/2016 CLINICAL DATA:  Evaluate NG tube placement EXAM: PORTABLE ABDOMEN - 1 VIEW COMPARISON:  KUB of 10/23/2016 FINDINGS: The tip of the NG tube is within the proximal to mid body of the stomach. The bowel gas pattern is nonspecific. No opaque  calculi are seen. IMPRESSION: Tip of NG tube overlies the mid proximal stomach. Electronically Signed   By: Ivar Drape M.D.   On: 10/24/2016 10:54   Dg Abd Portable 1v  Result Date: 10/23/2016 CLINICAL DATA:  NG tube advanced. EXAM: PORTABLE ABDOMEN - 1 VIEW COMPARISON:  10/07/2016 and 10/23/2016 FINDINGS: NG tube has been advanced with tip overlying the stomach just left of midline and side-port 9 just below the expected region of the gastroesophageal junction. Bowel gas pattern is nonobstructive. Remainder of the exam is unchanged. IMPRESSION: Nonobstructive bowel gas pattern. Interval advancement of the nasogastric tube with tip over the stomach just left of midline and side-port now just below the expected region of the gastroesophageal junction. Electronically Signed   By: Marin Olp M.D.   On: 10/23/2016 18:40   Dg Abd Portable 1v  Result Date: 10/23/2016 CLINICAL DATA:  Verify nasogastric tube placement. EXAM: PORTABLE ABDOMEN - 1 VIEW COMPARISON:  Abdominal radiograph dated October 22, 2016 FINDINGS: The proximal port of the nasogastric tube projects just above the GE junction. The tip projects in the gastric cardia. There are surgical clips in the gallbladder fossa. There are is contrast within the renal collecting systems bilaterally. There are mild degenerative spurs of the lumbar spine. IMPRESSION: The nasogastric tubes proximal port lies just above the GE junction. Advancement of the nasogastric tube by at least 10 cm is recommended to assure that both the proximal port and tip lie below the GE junction. Electronically Signed   By: David  Martinique M.D.   On: 10/23/2016 15:50   EEG   Diffuse slow activity, max over left temporal regions some improvement over the course of the recording.   PHYSICAL EXAM Middle-aged Caucasian male who is not in distress. . Afebrile. Head is nontraumatic. Neck is supple without bruit.    Cardiac exam no murmur or gallop. Lungs are clear to auscultation.  Distal pulses are well felt. Neurological Exam : Patient is stuporous. In response to sternal rub purposeful activity on the right. Eyes are closed. Pupils are equal 4 mm reactive. Fundi could not be visualized. Vision acuity and fields cannot be tested reliably. Patient moves right side purposefully against gravity without weakness. He has mild left-sided weakness but is able to move left arm and left leg against gravity but not to the same degree as the right side. He does not follow any commands. He moans and groans but doesn't does not have any spontaneous speech and does not follow commands. Deep tendon reflexes are 2+ symmetric.  Plantars have an ongoing response ASSESSMENT/PLAN Mr. Josias Sandner is a 50 y.o. male with history of  newly diagnosed R frontal mass, DM2, ETOH abuse HLD and HTN  presenting with confusion, L sided numbness and tingling x 2 weeks, neuro consulted for possible seizure.   Stroke:  Progressive bilateral anterior circulation infarcts in setting of severe bilateral ICA disease - Has uncontrolled DM II  R anterior circulation white matter  subacute infarct  1-2 weeks ago New L MCA/ACA watershed infarct   Resultant  R sided weakness, lethargy, not following commands, dysphagia.   MRI head  Progressive B anterior circulation infarcts, L MCA/ACA infarct.  MRI CS mild to moderate degenerative cervical foraminal stenosis  CTA head and neck  Functional occlusion B ICAs with reconstitution.  2D Echo  pending   LDL 90  HgbA1c 8.5  SCDs for VTE prophylaxis Diet NPO time specified Except for: Sips with Meds  No antithrombotic prior to admission, now on aspirin 325 mg daily and clopidogrel 75 mg daily  Ongoing aggressive stroke risk factor management  Taper steroids underway  Therapy recommendations:  pending   Disposition:  pending (lives w/ wife, works as a Patent examiner)  AMS Unclear etiology. Could be from the stroke. Less likely seizure. Checked  Ammonia level as patient received few doses of valproic acid along and keppra prevoiusly. It's mildly elevated. Does not seem to have liver abnormalities al though he does have heavy drinking hx.  - will do trial of lactulose - also added Ritalin 5mg  bid 6am and 10 am. We went back to check on him after first dose today and he is opening his eyes more than before.  On valproic acid, level is therapeutic. Consider tapering off.  Carotid Disease, bilateral aniogram done by Dr. Estanislado Pandy on 11/27 1. 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 3.Approx 80 to 85 % stenosis of RT VA at origin. 4.Bilaterally occluded pericallosal and callosal margin arteries with post ant reconstitution from P3 leptomeningeal collateralls and post thalamo perforaters  Unusual presentation  Likely atherosclerosis with his uncontrolled DM II  Plan for vertebral stenting in 2 weeks  Hypertension Permissive hypertension (OK if < 220/120)   Long-term BP goal normotensive  Hyperlipidemia  Home meds:  lipitor 10  Lipitor increased to 80 in hospital  LDL 90, goal < 70  Continue statin at discharge  Diabetes  HgbA1c 8.5.  Treatment per primary team.   Alcohol Abuse  Heavy drinker per wife  Recent fall on 11/9 after birthday party  [place] again last week  CIWA protocol. Would highly encourage avoiding sedatives as much as possible.   Dysphagia  Secondary to stroke  Placed NG for aspriin and plavix  Other Stroke Risk Factors  Cigarette smoker, advised to stop smoking  Overweight, Body mass index is 29 kg/m., recommend weight loss, diet and exercise as appropriate  obstructive sleep apnea, inconsistent use of CPAP at home   Hospital day # 4  Ahmed, Chesley Mires M.D. PGY-3  I have personally examined this patient, reviewed notes, independently viewed imaging studies, participated in medical decision making and plan of care.ROS completed  by me personally and pertinent positives fully documented  I have made any additions or clarifications directly to the above note. Agree with note above. Follow-up CT scan from this morning shows no significant progression of bilateral frontal infarcts or any development of cytotoxic edema. Plan to start Ritalin at 6 AM and 10 AM to see if it helps with improved while in arousal. Discussed with wife at the bedside and answered questions. This patient is critically ill and at significant risk of neurological worsening, death and care requires constant monitoring of vital signs, hemodynamics,respiratory and cardiac monitoring, extensive review of multiple databases, frequent neurological assessment, discussion with family, other specialists and medical decision making of high complexity.I have made any additions or clarifications directly to the above note.This critical  care time does not reflect procedure time, or teaching time or supervisory time of PA/NP/Med Resident etc but could involve care discussion time.  I spent 30 minutes of neurocritical care time  in the care of  this patient.      Antony Contras, MD Medical Director [facility] Pager: [phone] 10/25/2016 3:26 PM

## 2016-10-25 NOTE — Care Management Note (Signed)
Case Management Note  Patient Details  Name: Scott Harris MRN: [id number] Date of Birth: [birth year]  Subjective/Objective:   Presents with bil CVA's, lives with wife at home who still works and has two kids at home and there is no way she can take care of him at home the way he is, she would like for CSW to get stared with looking at [facility] for patient.  NCM informed CSW of this information.                    Action/Plan:   Expected Discharge Date:  10/24/16               Expected Discharge Plan:  [place]  In-House Referral:  Clinical Social Work  Discharge planning Services  CM Consult  Post Acute Care Choice:    Choice offered to:     DME Arranged:    DME Agency:     HH Arranged:    [place] Agency:     Status of Service:  In process, will continue to follow  If discussed at Long Length of Stay Meetings, dates discussed:    Additional Comments:  Zenon Mayo, RN 10/25/2016, 11:58 AM

## 2016-10-25 NOTE — Progress Notes (Addendum)
Subjective: No acute events overnight.  Per his wife, he has been less responsive since yesterday, no longer opening his eyes or squeezing her hand.  Wife Estill Bamberg requests that she be the one to receive updates regarding Mr Single status first without directly disseminating to other family members.  She also requests that she be given the opportunity to leave the room before he is examined.  Objective:  Vital signs in last 24 hours: Vitals:   10/25/16 0300 10/25/16 0400 10/25/16 0500 10/25/16 0800  BP: (!) 171/91 (!) 177/89 (!) 166/89 (!) 157/78  Pulse: 93 83 89 93  Resp: (!) 23 20 (!) 22 (!) 21  Temp:    98.7 F (37.1 C)  TempSrc:    Axillary  SpO2: 95% 96% 96% 92%  Weight:      Height:       Physical Exam  Constitutional:  Lying in bed in no distress with NG tube in place  HENT:  Head: Normocephalic and atraumatic.  Cardiovascular: Normal rate and regular rhythm.   Pulmonary/Chest: Effort normal and breath sounds normal.  Neurological:  GCS 8 (E1V2M5) @0800  Gag present PERRL Spontaneous movement of legs and right arm Flutters and partial opens eyes spontaneously, resists lid retraction    MRI Brain without Contrast 10/22/2016 IMPRESSION: 1. Emergent Anterior Circulation Large Vessel Occlusions - both Internal Carotid Arteries likely affected. Progressive bilateral anterior circulation infarcts, newly involving the left MCA/ACA territories. 2. No associated hemorrhage or mass effect at this time. 3. This was discussed by telephone with Dr. Elpidio Anis On 10/22/2016 at 1037 hours. We are arranging for the patient to undergo stat CTA head and neck at the time of this dictation.  CTA Head and Neck 10/22/2016  IMPRESSION: 1. Functional occlusion of both ICA siphons with suboptimal reconstitution of flow at both ICA termini:  - Radiographic string sign stenosis at the right ICA origin in the neck related to bulky low-density plaque or thrombus. The cervical Right ICA  demonstrates thread-like enhancement and appears to occlude at the skullbase. I suspect much of the Right ICA siphon is fully occluded.  - Tapered appearance of the cervical Left ICA without focal stenosis in the neck. There is thread-like enhancement in the Left ICA siphon, but I suspect the supraclinoid Left ICA segment is fully occluded. Thus the tapering in the neck is likely do to distal outflow obstruction.  - Reconstituted flow from the posterior communicating arteries, which unfortunately are diminutive.  - No ACA or MCA occlusion identified, but the anterior circulation appears diminutive throughout.  2. Preliminary report of the above was discussed with Dr. Elpidio Anis at 1138 hours. 3. Posterior circulation is largely normal. There does appear to be a moderate or severe atherosclerotic stenosis at the right vertebral artery origin, but no other posterior circulation stenosis. 4. Expected CT appearance of evolving bilateral anterior circulation infarcts. No associated hemorrhage or mass effect. 5. No other abnormality in the neck. 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  Anterior and Posterior Circulation Catheter Angiogram 10/23/2016 1. 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 3.Approx 80 to 85 % stenosis of RT VA at origin. 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  CT Head without Contrast 10/25/2016 IMPRESSION: Low-density in the known infarctions affecting the right frontal deep white matter in the left frontal cortical and subcortical brain. No sign of hemorrhagic transformation or  significant mass effect/shift. The seems to be a little more involvement in the left basal ganglia by today's CT.  Lipid Panel     Component Value Date/Time   CHOL 172 10/22/2016 1425   TRIG 157 (H) 10/22/2016  1425   HDL 51 10/22/2016 1425   CHOLHDL 3.4 10/22/2016 1425   VLDL 31 10/22/2016 1425   LDLCALC 90 10/22/2016 1425   Lab Results  Component Value Date   HGBA1C 8.5 (H) 10/22/2016    Assessment/Plan:  Principal Problem:   CVA (cerebral vascular accident) ([place]) Active Problems:   Essential hypertension with goal blood pressure less than 130/80   Type 2 diabetes mellitus with complication (HCC)   Altered mental status   Alcoholism (HCC)   Seizures (HCC)   Cerebral thrombosis with cerebral infarction  50 year old man with acute on subacute neurologic impairments and imaging findings initially thought to be due to a right frontal brain mass now discovered to have bilateral CVAs secondary to bilateral ICA occlusions.  He is not a candidate for revascularization due to total occlusion of intracranial ICAs.  #Bilateral Frontal Strokes #Bilateral ICA Occlusion Overnight video EEG without evidence of seizure per Dr Clydene Fake note on 11/28. -Stroke team following -Aspirin 325 mg daily -Plavix 75 mg daily -Atorvastatin 80 mg daily -May d/c valproate per Stroke Team recs -Echo  #HTN Permissive hypertension, goal 130-220/110. -Hold lisinopril  #DM A1c 8.5%.  Was on sitagliptin-metformin 50-1000 mg once daily at home. Gave Lantus 15U yesterday, BGs in 200s and received 17U Novolog corrective over the course of the day. -Increase Lantus to 20U daily -Q4h SSI  #FEN -Enteral nutrition and hydration by NG tube per RD  Dispo: Anticipated discharge contingent on clinical course.  Minus Liberty, MD 10/25/2016, 8:46 AM Pager: [phone]

## 2016-10-25 NOTE — Progress Notes (Signed)
Pt wife is tearful at this moment. Wife stated she would like all MD's to clear the room before doing assessments. Pt wife requested that no medical information should be given to any other person except herself. She stated basic information is ok but no specific information pertaining to his medical condition should be shared. I offered to make the pt a XXX and change pt current password but wife was against that because she stated she didn't want to close everyone out completely and that she just wanted to keep his medical information safe. I informed the charge nurse of this issue and I share will the oncoming shift. Will continue to monitor.

## 2016-10-26 DIAGNOSIS — R400 Somnolence: Secondary | ICD-10-CM

## 2016-10-26 DIAGNOSIS — I6330 Cerebral infarction due to thrombosis of unspecified cerebral artery: Secondary | ICD-10-CM

## 2016-10-26 DIAGNOSIS — R4020 Unspecified coma: Secondary | ICD-10-CM

## 2016-10-26 LAB — GLUCOSE, CAPILLARY
GLUCOSE-CAPILLARY: 243 mg/dL — AB (ref 65–99)
Glucose-Capillary: 231 mg/dL — ABNORMAL HIGH (ref 65–99)
Glucose-Capillary: 242 mg/dL — ABNORMAL HIGH (ref 65–99)
Glucose-Capillary: 218 mg/dL — ABNORMAL HIGH (ref 65–99)
Glucose-Capillary: 232 mg/dL — ABNORMAL HIGH (ref 65–99)

## 2016-10-26 MED ORDER — METHYLPHENIDATE HCL 5 MG PO TABS
10.0000 mg | ORAL_TABLET | Freq: Two times a day (BID) | ORAL | Status: DC
Start: 2016-10-27 — End: 2016-10-29
  Administered 2016-10-27 – 2016-10-29 (×6): 10 mg via ORAL
  Filled 2016-10-26 (×6): qty 2

## 2016-10-26 MED ORDER — METHYLPHENIDATE HCL 5 MG PO TABS
5.0000 mg | ORAL_TABLET | Freq: Once | ORAL | Status: AC
  Administered 2016-10-26: 5 mg via ORAL
  Filled 2016-10-26: qty 1

## 2016-10-26 MED ORDER — INSULIN GLARGINE 100 UNIT/ML ~~LOC~~ SOLN
25.0000 [IU] | Freq: Every day | SUBCUTANEOUS | Status: DC
  Administered 2016-10-26 – 2016-10-29 (×4): 25 [IU] via SUBCUTANEOUS
  Filled 2016-10-26 (×4): qty 0.25

## 2016-10-26 NOTE — Progress Notes (Signed)
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 Patient was not considered for IV t-PA. Due to late presentation and stroke not initially suspected   SUBJECTIVE (INTERVAL HISTORY) His wife is at the bedside. He continues to be lethargic. No other changes overnight. He partially opens eyes and is more purposeful but not following commands   OBJECTIVE Temp:  [99.2 F (37.3 C)-99.6 F (37.6 C)] 99.5 F (37.5 C) (11/30 1200) Pulse Rate:  [102-116] 116 (11/30 1200) Cardiac Rhythm: Normal sinus rhythm (11/30 0845) Resp:  [19-27] 27 (11/30 1200) BP: (171-205)/(92-112) 197/112 (11/30 1200) SpO2:  [93 %-95 %] 93 % (11/30 1200) Weight:  [191 lb 2.2 oz (86.7 kg)] 191 lb 2.2 oz (86.7 kg) (11/30 0400)  CBC:  Recent Labs Lab 10/20/16 1322  10/11/2016 0806 10/22/16 0404 10/25/16 0350  WBC 7.7  --  5.7 10.4 13.8*  NEUTROABS 5.0  --  5.0  --   --   HGB 16.5  < > 16.3 16.1 15.9  HCT 45.6  < > 45.7 45.3 45.1  MCV 87.4  --  86.6 87.1 88.1  PLT 264  --  252 276 255  < > = values in this interval not displayed.  Basic Metabolic Panel:   Recent Labs Lab 10/22/16 0404 10/25/16 0350  NA 137 139  K 4.4 3.9  CL 101 104  CO2 24 23  GLUCOSE 222* 212*  BUN 15 14  CREATININE 0.81 0.71  CALCIUM 9.9 9.8  MG 2.0  --     Lipid Panel:     Component Value Date/Time   CHOL 172 10/22/2016 1425   TRIG 157 (H) 10/22/2016 1425   HDL 51 10/22/2016 1425   CHOLHDL 3.4 10/22/2016 1425   VLDL 31 10/22/2016 1425   LDLCALC 90 10/22/2016 1425   HgbA1c:  Lab Results  Component Value Date    HGBA1C 8.5 (H) 10/22/2016   Urine Drug Screen:     Component Value Date/Time   LABOPIA NONE DETECTED 10/20/2016 1437   COCAINSCRNUR NONE DETECTED 10/20/2016 1437   LABBENZ NONE DETECTED 10/20/2016 1437   AMPHETMU NONE DETECTED 10/20/2016 1437   THCU NONE DETECTED 10/20/2016 1437   LABBARB NONE DETECTED 10/20/2016 1437      IMAGING  Ct Head Wo Contrast  Result Date: 10/25/2016 CLINICAL DATA:  Acute presentation with confusion and bilateral lower extremity weakness. Stroke with intervention. EXAM: CT HEAD WITHOUT CONTRAST TECHNIQUE: Contiguous axial images were obtained from the base of the skull through the vertex without intravenous contrast. COMPARISON:  10/22/2016 FINDINGS: Brain: Persistent low-density throughout the acute bilateral infarctions affecting the right frontal deep white matter in the left frontal cortical and subcortical brain. Involvement of the left basal ganglia appear slightly more pronounced than seen previously. Mild swelling in the region of infarction but no hemorrhage or mass effect resulting in shift. No posterior circulation insult identified. No hydrocephalus or extra-axial collection. Vascular: No vascular finding by CT. Skull: Negative Sinuses/Orbits: Mild sinus mucosal inflammation again demonstrated most notably in the right maxillary sinus. Orbits negative. Other: None significant IMPRESSION: Low-density in the known infarctions affecting the right frontal deep white matter in the  left frontal cortical and subcortical brain. No sign of hemorrhagic transformation or significant mass effect/shift. The seems to be a little more involvement in the left basal ganglia by today's CT. Electronically Signed   By: Nelson Chimes M.D.   On: 10/25/2016 06:34   EEG   Diffuse slow activity, max over left temporal regions some improvement over the course of the recording.   PHYSICAL EXAM Middle-aged Caucasian male who is not in distress. . Afebrile. Head is nontraumatic.  Neck is supple without bruit.    Cardiac exam no murmur or gallop. Lungs are clear to auscultation. Distal pulses are well felt. Neurological Exam : Patient is stuporous. In response to sternal rub purposeful activity on the right. Eyes are closed. Pupils are equal 4 mm reactive. Fundi could not be visualized. Vision acuity and fields cannot be tested reliably. Patient moves right side purposefully against gravity without weakness. He has mild left-sided weakness but is able to move left arm and left leg against gravity but not to the same degree as the right side. He does not follow any commands. He moans and groans but doesn't does not have any spontaneous speech and does not follow commands. Deep tendon reflexes are 2+ symmetric.  Plantars have an ongoing response ASSESSMENT/PLAN Scott Harris is a 50 y.o. male with history of  newly diagnosed R frontal mass, DM2, ETOH abuse HLD and HTN  presenting with confusion, L sided numbness and tingling x 2 weeks, neuro consulted for possible seizure.   Stroke:  Progressive bilateral anterior circulation infarcts in setting of severe bilateral ICA disease - Has uncontrolled DM II  R anterior circulation white matter  subacute infarct  1-2 weeks ago New L MCA/ACA watershed infarct   Resultant  R sided weakness, lethargy, not following commands, dysphagia.   MRI head  Progressive B anterior circulation infarcts, L MCA/ACA infarct.  MRI CS mild to moderate degenerative cervical foraminal stenosis  CTA head and neck  Functional occlusion B ICAs with reconstitution.  2D Echo  pending   LDL 90  HgbA1c 8.5  SCDs for VTE prophylaxis Diet NPO time specified Except for: Sips with Meds  No antithrombotic prior to admission, now on aspirin 325 mg daily and clopidogrel 75 mg daily  Ongoing aggressive stroke risk factor management  Taper steroids underway  Therapy recommendations:  pending   Disposition:  pending (lives w/ wife, works as a Chartered loss adjuster)  AMS Unclear etiology. Could be from the stroke. Less likely seizure. Checked Ammonia level as patient received few doses of valproic acid along and keppra prevoiusly. It's mildly elevated. Does not seem to have liver abnormalities al though he does have heavy drinking hx.  - will do trial of lactulose - also added Ritalin 5mg  bid 6am and 10 am. We went back to check on him after first dose today and he is opening his eyes more than before.  On valproic acid, level is therapeutic. Consider tapering off.  Carotid Disease, bilateral aniogram done by Dr. Estanislado Pandy on 11/27 1. 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 3.Approx 80 to 85 % stenosis of RT VA at origin. 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  Treatment per primary team.   Alcohol Abuse  Heavy drinker per wife  Recent fall on 11/9 after birthday party  [place] again last week  CIWA protocol. Would highly encourage avoiding sedatives as much as possible.   Dysphagia  Secondary to stroke  Placed NG for aspriin and plavix  Other Stroke Risk Factors  Cigarette smoker, advised to stop smoking  Overweight, Body mass index is 29.06 kg/m., recommend weight loss, diet and exercise as appropriate  obstructive sleep apnea, inconsistent use of CPAP at home  Elevated ammonia - on lactulose   Hospital day # 5  SETHI,PRAMOD M.D. PGY-3  I have personally examined this  patient, reviewed notes, independently viewed imaging studies, participated in medical decision making and plan of care.ROS completed by me personally and pertinent positives fully documented  I have made any additions or clarifications directly to the above note. Agree with note above. Recommend increase Ritalin dose to 10 mg at 6 am and 10 am.Discussed with wife and father at the bedside and answered questions. This patient is critically ill and at significant risk of neurological worsening, death and care requires constant monitoring of vital signs, hemodynamics,respiratory and cardiac monitoring, extensive review of multiple databases, frequent neurological assessment, discussion with family, other specialists and medical decision making of high complexity.I have made any additions or clarifications directly to the above note.This critical care time does not reflect procedure time, or teaching time or supervisory time of PA/NP/Med Resident etc but could involve care discussion time.  I spent 30 minutes of neurocritical care time  in the care of  this patient.      Antony Contras, MD Medical Director [facility] Pager: [phone] 10/26/2016 2:23 PM

## 2016-10-26 NOTE — Consult Note (Signed)
Physical Medicine and Rehabilitation Consult   Reason for Consult:  Bilateral strokes Referring Physician: Dr. Leonie Man.    HPI: Scott Harris is a 50 y.o. male with history of T2DM, HTN, ETOH abuse, dyslipidemia who was admitted on 10/20/16 with 2 week history of HA, slurred nonsensical speech, confusion and falls.   MRI brain done revealing area of diffusion restriction and hyperintense T2 signal with question of glioma. He was started on decadron and keppra and discharged to home but readmitted on 11/25 with falls facial droop and difficulty following commands. EEG showed left frontotemporal slow activity with evidence of generalized non-specific cerebral dysfunction. Repeat MRI revealed emergent anterior circulation large vessel occlusions affecting internal carotid arteries, progressive B-ACA infarcts affecting L-MCA/ACA. He underwent cerebral angio revealing functional occlusion of both ICA siphons with reconstitution at both ICA termini, approx 80-85% stenosis at R-VA origin. Dr. Estanislado Pandy recommends vertebral stenting in 2 weeks.   Dr. Leonie Man felt that white matter subacute infarct 1-2 weeks ago and patient started on ASA/Plavix. Ritalin added to help with lethargy. AMS felt to be due to stroke and less likely due to seizure.  Elevated ammonia levels treated with lactulose. He is showing some improvement with ability to open eyes partially and has Left > right sided weakness. Therapy evaluations done showing limitations due to lethargy with delay and perseveration.   Review of Systems  Unable to perform ROS: Patient unresponsive      Past Medical History:  Diagnosis Date  . Diabetes mellitus without complication ([place])   . Hyperlipemia   . Hypertension     Past Surgical History:  Procedure Laterality Date  . CHOLECYSTECTOMY    . HAND SURGERY    . IR GENERIC HISTORICAL  10/23/2016   IR ANGIO INTRA EXTRACRAN SEL COM CAROTID INNOMINATE BILAT MOD SED 10/23/2016 Luanne Bras,  MD [facility]  . TONSILLECTOMY      History reviewed. No pertinent family history.    Social History:  Married. Works as a Merchant navy officer. Has two teenagers at home (41 and 48 years old). Per reports that he has been smoking Cigarettes--1 PPD--quit a few months ago?.  He has a 30.00 pack-year smoking history. He has never used smokeless tobacco. per reports he drinks 18 pack of beer daily. . Per reports that  does not use drugs.    Allergies: No Known Allergies    Medications Prior to Admission  Medication Sig Dispense Refill  . atorvastatin (LIPITOR) 10 MG tablet Take 5 mg by mouth daily.    Marland Kitchen CIALIS 5 MG tablet TAKE ONE TABLET (5 MG TOTAL) BY MOUTH DAILY AS NEEDED FOR ERECTILE DYSFUNCTION.  2  . [EXPIRED] ciprofloxacin (CILOXAN) 0.3 % ophthalmic solution Place 1 drop into the left eye as directed. Every 5 hours for a total of 5 days, to be completed around 10/24/16    . dexamethasone (DECADRON) 4 MG tablet Take 2 tablets (8 mg total) by mouth 2 (two) times daily. 56 tablet 1  . FLUoxetine (PROZAC) 20 MG capsule Take 20 mg by mouth every morning.   2  . gabapentin (NEURONTIN) 300 MG capsule Take 600 mg by mouth at bedtime.    Marland Kitchen ibuprofen (ADVIL,MOTRIN) 200 MG tablet Take 400 mg by mouth every 4 (four) hours as needed for moderate pain.    Marland Kitchen lansoprazole (PREVACID) 30 MG capsule Take 30 mg by mouth every morning.     . levETIRAcetam (KEPPRA) 500 MG tablet Take 1 tablet (500 mg total)  by mouth 2 (two) times daily. 60 tablet 0  . lisinopril (PRINIVIL,ZESTRIL) 2.5 MG tablet Take 2.5 mg by mouth daily.    . methocarbamol (ROBAXIN) 500 MG tablet Can take up to 1-2 tabs every 6 hours PRN PAIN (Patient taking differently: Take 500-1,000 mg by mouth every 6 (six) hours as needed for muscle spasms. ) 20 tablet 0  . omeprazole (PRILOSEC) 20 MG capsule Take 1 capsule (20 mg total) by mouth daily. 30 capsule 1  . oxyCODONE-acetaminophen (PERCOCET) 5-325 MG tablet Take 1 tablet by mouth every 4  (four) hours as needed. (Patient taking differently: Take 1 tablet by mouth every 4 (four) hours as needed for severe pain. ) 13 tablet 0  . SitaGLIPtin-MetFORMIN HCl 50-1000 MG TB24 Take 1 tablet by mouth every morning.    . TESTIM 50 MG/5GM (1%) GEL APPLY 1 PACKET ONTO THE SKIN EACH MORNING TO SHOULDER AND UPPER ARM ALLOW TO DRY 5 MIN BEFORE DRESSI  2    Home: Home Living Family/patient expects to be discharged to:: Inpatient rehab Living Arrangements: Spouse/significant other, Children  Functional History: Prior Function Level of Independence: Independent Comments: pt worked for [employer]. Functional Status:  Mobility: Bed Mobility Overal bed mobility: Needs Assistance Bed Mobility: Supine to Sit, Sit to Supine Supine to sit: Total assist Sit to supine: Max assist General bed mobility comments: Pt assisted with lifting trunk and with lowering trunk to bed.  On command, he assisted minimally with lifting LEs onto bed  Transfers General transfer comment: Unable       ADL: ADL Overall ADL's : Needs assistance/impaired Eating/Feeding: NPO Grooming: Wash/dry hands, Wash/dry face, Oral care, Brushing hair, Total assistance, Bed level Upper Body Bathing: Total assistance, Bed level Lower Body Bathing: Total assistance, Bed level Upper Body Dressing : Total assistance, Bed level Lower Body Dressing: Total assistance, Bed level Toilet Transfer: Total assistance Toilet Transfer Details (indicate cue type and reason): uanble Toileting- Clothing Manipulation and Hygiene: Total assistance, Bed level Toileting - Clothing Manipulation Details (indicate cue type and reason): Pt incontinent of stool.  Assisted with peri care  Functional mobility during ADLs: Maximal assistance General ADL Comments: Pt unable to assist with ADLs   Cognition: Cognition Overall Cognitive Status: Impaired/Different from baseline Orientation Level: Other (comment) (UTA) Cognition Arousal/Alertness:  Awake/alert Behavior During Therapy: Flat affect Overall Cognitive Status: Impaired/Different from baseline Area of Impairment: Attention, Following commands Current Attention Level: Focused Following Commands: Follows one step commands inconsistently General Comments: Pt lethargic.  He opens eyes to noxious stimulation.   He followed one step command to open mouth (with mod facilitation), hold up thumb x 2,  and hold up two fingers x 2 (all with Rt UE).  does not follow commands with Lt UE.  He did assist with lifting LEs onto bed on command and with physical faciliation  Difficult to assess due to: Level of arousal   Blood pressure (!) 177/113, pulse (!) 120, temperature 98.8 F (37.1 C), temperature source Axillary, resp. rate (!) 23, height 5\' 8"  (1.727 m), weight 86.7 kg (191 lb 2.2 oz), SpO2 96 %. Physical Exam  Nursing note and vitals reviewed. Constitutional: He appears well-developed and well-nourished. He appears listless. No distress.  Panda in place  HENT:  Head: Normocephalic and atraumatic.  Eyes:  Conjunctivae appears clear. Minimal eye movements.   Neck:  Neck flexed to the right  Cardiovascular: Tachycardia present.   HR in 120s  Respiratory: Effort normal. No stridor. No respiratory distress. He has  rhonchi.  GI: Soft. Bowel sounds are normal. He exhibits no distension. There is no tenderness.  Musculoskeletal: He exhibits no edema.  Somnolent--briefly opened eyes. Puffy breaths at times. Kept eyes fixed to the right and did not make eye contact or engage. He was able to withdraw RUE/RLE to tactile stimulation. Left side with emerging tone.   Neurological: He appears listless.  Pt's eyes are open. Does not make eye contact. eyes closed at times during exam. Very lethargic. Grips my hand with his right hand. Does not initiate any movement with left. Moves both legs when feet are rubbed, partly reflexic/partly withdrawal (mother stated he was very sensitive in his feet).  DTR's are 2 to 3+ throughout.   Skin: Skin is warm and dry. He is not diaphoretic.  Psychiatric:  flat    Results for orders placed or performed during the hospital encounter of 10/26/2016 (from the past 24 hour(s))  Glucose, capillary     Status: Abnormal   Collection Time: 10/25/16  4:43 PM  Result Value Ref Range   Glucose-Capillary 245 (H) 65 - 99 mg/dL  Glucose, capillary     Status: Abnormal   Collection Time: 10/25/16  7:34 PM  Result Value Ref Range   Glucose-Capillary 199 (H) 65 - 99 mg/dL  Glucose, capillary     Status: Abnormal   Collection Time: 10/25/16 11:50 PM  Result Value Ref Range   Glucose-Capillary 241 (H) 65 - 99 mg/dL  Glucose, capillary     Status: Abnormal   Collection Time: 10/26/16  3:59 AM  Result Value Ref Range   Glucose-Capillary 231 (H) 65 - 99 mg/dL  Glucose, capillary     Status: Abnormal   Collection Time: 10/26/16  8:44 AM  Result Value Ref Range   Glucose-Capillary 243 (H) 65 - 99 mg/dL   Comment 1 Notify RN    Comment 2 Document in Chart   Glucose, capillary     Status: Abnormal   Collection Time: 10/26/16 12:29 PM  Result Value Ref Range   Glucose-Capillary 242 (H) 65 - 99 mg/dL   Comment 1 Notify RN    Comment 2 Document in Chart    Ct Head Wo Contrast  Result Date: 10/25/2016 CLINICAL DATA:  Acute presentation with confusion and bilateral lower extremity weakness. Stroke with intervention. EXAM: CT HEAD WITHOUT CONTRAST TECHNIQUE: Contiguous axial images were obtained from the base of the skull through the vertex without intravenous contrast. COMPARISON:  10/22/2016 FINDINGS: Brain: Persistent low-density throughout the acute bilateral infarctions affecting the right frontal deep white matter in the left frontal cortical and subcortical brain. Involvement of the left basal ganglia appear slightly more pronounced than seen previously. Mild swelling in the region of infarction but no hemorrhage or mass effect resulting in shift. No posterior  circulation insult identified. No hydrocephalus or extra-axial collection. Vascular: No vascular finding by CT. Skull: Negative Sinuses/Orbits: Mild sinus mucosal inflammation again demonstrated most notably in the right maxillary sinus. Orbits negative. Other: None significant IMPRESSION: Low-density in the known infarctions affecting the right frontal deep white matter in the left frontal cortical and subcortical brain. No sign of hemorrhagic transformation or significant mass effect/shift. The seems to be a little more involvement in the left basal ganglia by today's CT. Electronically Signed   By: Nelson Chimes M.D.   On: 10/25/2016 06:34    Assessment/Plan: Diagnosis: bi-cerebral infarcts with altered mental status, tetraplegia 1. Does the need for close, 24 hr/day medical supervision in concert with the  patient's rehab needs make it unreasonable for this patient to be served in a less intensive setting? Yes 2. Co-Morbidities requiring supervision/potential complications: DM2, seizures, htn, hx of etoh abuse 3. Due to bladder management, bowel management, safety, skin/wound care, disease management, medication administration, pain management and patient education, does the patient require 24 hr/day rehab nursing? Yes 4. Does the patient require coordinated care of a physician, rehab nurse, PT (1-2 hrs/day, 5 days/week), OT (1-2 hrs/day, 5 days/week) and SLP (1-2 hrs/day, 5 days/week) to address physical and functional deficits in the context of the above medical diagnosis(es)? Yes Addressing deficits in the following areas: balance, endurance, locomotion, strength, transferring, bowel/bladder control, bathing, dressing, feeding, grooming, toileting and psychosocial support 5. Can the patient actively participate in an intensive therapy program of at least 3 hrs of therapy per day at least 5 days per week? Yes 6. The potential for patient to make measurable gains while on inpatient rehab is  excellent 7. Anticipated functional outcomes upon discharge from inpatient rehab are min assist and mod assist  with PT, min assist and mod assist with OT, supervision and min assist with SLP. 8. Estimated rehab length of stay to reach the above functional goals is: potentially 20-30 days 9. Does the patient have adequate social supports and living environment to accommodate these discharge functional goals? Yes and Potentially 10. Anticipated D/C setting: Home 11. Anticipated post D/C treatments: [facility] 12. Overall Rehab/Functional Prognosis: excellent  RECOMMENDATIONS: This patient's condition is appropriate for continued rehabilitative care in the following setting: CIR Patient has agreed to participate in recommended program. N/A Note that insurance prior authorization may be required for reimbursement for recommended care.  Comment: Rehab Admissions Coordinator to follow up.  Thanks,  Meredith Staggers, MD, Mellody Drown     10/26/2016

## 2016-10-26 NOTE — Progress Notes (Signed)
Rehab Admissions Coordinator Note:  Patient was screened by Retta Diones for appropriateness for an Inpatient Acute Rehab Consult.  At this time, we are recommending Inpatient Rehab consult.  Retta Diones 10/26/2016, 2:44 PM  I can be reached at [phone].

## 2016-10-26 NOTE — Progress Notes (Signed)
   Subjective: Wife reports spontaneous movement of all 4 extremities, spontaneous eye opening, and occasionally following commands.  Objective:  Vital signs in last 24 hours: Vitals:   10/25/16 1528 10/25/16 1935 10/26/16 0000 10/26/16 0400  BP: (!) 192/101 (!) 205/108 (!) 179/99 (!) 179/92  Pulse: (!) 108 (!) 114 (!) 112 (!) 102  Resp: (!) 27 19 (!) 21 (!) 21  Temp: 99.3 F (37.4 C) 99.2 F (37.3 C) 99.6 F (37.6 C) 99.5 F (37.5 C)  TempSrc: Axillary Axillary Axillary Axillary  SpO2: 94% 94% 93% 95%  Weight:    191 lb 2.2 oz (86.7 kg)  Height:       Physical Exam  Constitutional:  Sleeping in bed, moving restlessly occasionally in bed, NG tube in place  Cardiovascular: Normal rate and regular rhythm.   Pulmonary/Chest: Effort normal and breath sounds normal.  Neurological:  GCS 9 (E1V2M6) PERRL Eyelids flutter when instructed to oepn eyes Spontaneous movement of RUE, bilateral LE Inconsistently obeys commands to move RUE, bilateral EL   Assessment/Plan:  Principal Problem:   CVA (cerebral vascular accident) ([place]) Active Problems:   Essential hypertension with goal blood pressure less than 130/80   Type 2 diabetes mellitus with complication (HCC)   Altered mental status   Alcoholism ([place])   Seizures (HCC)   Cerebral thrombosis with cerebral infarction  50 year old man with acute on subacute neurologic impairments and imaging findings initially thought to be due to a right frontal brain mass now discovered to have bilateral CVAs secondary to bilateral ICA occlusions.  He is not a candidate for revascularization due to total occlusion of intracranial ICAs.  #Bilateral Frontal Strokes #Bilateral ICA Occlusion -Stroke team following -Aspirin 325 mg daily -Plavix 75 mg daily -Atorvastatin 80 mg daily -D/c valproate -Methylphenidate per Stroke Team  #Hepatic Encephalopathy? Mildly elevated ammonia, concern for hepatic encephalopathy contributing to  lethargy. -Lactulose BID  #HTN Permissive hypertension, goal 130-220/110. -Hold lisinopril  #DM A1c 8.5%.  Was on sitagliptin-metformin 50-1000 mg once daily at home. Gave Lantus 20U yesterday, BGs in 200s and received ~15U Novolog corrective over the course of the day. -Increase Lantus to 25U daily -Q4h SSI  #FEN -Enteral nutrition and hydration by NG tube per RD  Dispo: Anticipated discharge contingent on clinical course.  Minus Liberty, MD 10/26/2016, 9:01 AM Pager: [phone]

## 2016-10-26 NOTE — Evaluation (Signed)
Occupational Therapy Evaluation Patient Details Name: Scott Harris MRN: [id number] DOB: 08/02/1966 Today's Date: 10/26/2016    History of Present Illness pt presents with Progressive Bil Anterior Circulation Infarcts, new L MCA/ACA watershed infarcts, and Severe bil ICA Stenosis.  pt with hx of recent possibleR Frontal Mass, PMH of DM Etoh, and HTN.     Clinical Impression   Pt admitted with above. He demonstrates the below listed deficits and will benefit from continued OT to maximize safety and independence with BADLs.  Pt presents to OT with generalized weakness, impaired trunk control, appears to have Lt hemi inattention, impaired cognition.  He followed one step commands intermittently to hold up thumb, open mouth, hold up 2 fingers, and to assist with lifting LEs onto bed.  He initially required max A for EOB sitting, but progressed to min A.  Spontaneous movement of Lt LE and LE UE noted.   He requires total A for ADLs.  Anticipate once his arousal improves, he will be an excellent candidate for CIR, if family able to provide necessary level of care at discharge.  Will follow.       Follow Up Recommendations  CIR    Equipment Recommendations  None recommended by OT    Recommendations for Other Services Rehab consult     Precautions / Restrictions Precautions Precautions: Fall Precaution Comments: NG Tube Restrictions Weight Bearing Restrictions: No      Mobility Bed Mobility Overal bed mobility: Needs Assistance Bed Mobility: Supine to Sit;Sit to Supine     Supine to sit: Total assist Sit to supine: Max assist   General bed mobility comments: Pt assisted with lifting trunk and with lowering trunk to bed.  On command, he assisted minimally with lifting LEs onto bed   Transfers                 General transfer comment: Unable     Balance Overall balance assessment: Needs assistance Sitting-balance support: Feet supported;Bilateral upper extremity  supported Sitting balance-Leahy Scale: Poor Sitting balance - Comments: Pt initially requiring max A with heavy posterior lean.  with facilitation of head/neck into flexion, he was able to progress to min A EOB x 10 mins                                     ADL Overall ADL's : Needs assistance/impaired Eating/Feeding: NPO   Grooming: Wash/dry hands;Wash/dry face;Oral care;Brushing hair;Total assistance;Bed level   Upper Body Bathing: Total assistance;Bed level   Lower Body Bathing: Total assistance;Bed level   Upper Body Dressing : Total assistance;Bed level   Lower Body Dressing: Total assistance;Bed level   Toilet Transfer: Total assistance Toilet Transfer Details (indicate cue type and reason): uanble Toileting- Clothing Manipulation and Hygiene: Total assistance;Bed level Toileting - Clothing Manipulation Details (indicate cue type and reason): Pt incontinent of stool.  Assisted with peri care      Functional mobility during ADLs: Maximal assistance General ADL Comments: Pt unable to assist with ADLs      Vision Additional Comments: Pt with Rt gaze preference.  He will look to midline inconsistently.  Gaze appears dysconjugate    Perception Perception Perception Tested?: Yes Perception Deficits: Inattention/neglect Inattention/Neglect: Does not attend to left visual field;Does not attend to left side of body   Praxis Praxis Praxis tested?: Deficits Deficits: Initiation    Pertinent Vitals/Pain Pain Assessment: Faces Faces Pain Scale: No hurt  Hand Dominance     Extremity/Trunk Assessment Upper Extremity Assessment Upper Extremity Assessment: RUE deficits/detail;LUE deficits/detail RUE Deficits / Details: Pt will spontaneously squeeze therapist's hand, hold up fingers and thumb.  PROM WFL elbow distally.  Shoulder tighness noted at 90*, able to achieve ~120 shoulder flexion with facilitation of scap into upward rotation.  Unsure if pt  volitionally resiting movement or if increased tone present  RUE Coordination: decreased fine motor;decreased gross motor LUE Deficits / Details: Pt does not follow commands with Lt UE, however, he will use Lt UE as a support when sitting - Lt UE placed on bedrail, with pt holding onto rail with Lt hand.  He spontaneously moved Lt hand to the bed and pushed into Lt UE to try to scoot left hip back on bed  LUE Coordination: decreased fine motor;decreased gross motor   Lower Extremity Assessment Lower Extremity Assessment: Defer to PT evaluation RLE Deficits / Details: Sensation intact to soft touch as pt quite ticklish in his feet.  Only active movement noted was in response to tickling.   RLE Coordination: decreased fine motor;decreased gross motor LLE Deficits / Details: Sensation intact to soft touch as pt quite ticklish in his feet.  Only active movement noted was in response to tickling.   LLE Coordination: decreased fine motor;decreased gross motor   Cervical / Trunk Assessment Cervical / Trunk Assessment: Other exceptions Cervical / Trunk Exceptions: Pt with head and neck rotated to Rt and tends to extend neck.  decreased trunk control    Communication Communication Communication: Other (comment) (follows occasional one step commands, no attempts at verbali)   Cognition Arousal/Alertness: Awake/alert Behavior During Therapy: Flat affect Overall Cognitive Status: Impaired/Different from baseline Area of Impairment: Attention;Following commands   Current Attention Level: Focused   Following Commands: Follows one step commands inconsistently       General Comments: Pt lethargic.  He opens eyes to noxious stimulation.   He followed one step command to open mouth (with mod facilitation), hold up thumb x 2,  and hold up two fingers x 2 (all with Rt UE).  does not follow commands with Lt UE.  He did assist with lifting LEs onto bed on command and with physical faciliation    General  Comments       Exercises       Shoulder Instructions      Home Living Family/patient expects to be discharged to:: Inpatient rehab Living Arrangements: Spouse/significant other;Children                                      Prior Functioning/Environment Level of Independence: Independent        Comments: pt worked for [employer].        OT Problem List: Decreased strength;Decreased range of motion;Decreased activity tolerance;Impaired balance (sitting and/or standing);Impaired vision/perception;Decreased coordination;Decreased cognition;Decreased knowledge of use of DME or AE;Decreased safety awareness;Cardiopulmonary status limiting activity;Impaired tone;Impaired sensation;Impaired UE functional use   OT Treatment/Interventions: Self-care/ADL training;Neuromuscular education;DME and/or AE instruction;Manual therapy;Therapeutic activities;Cognitive remediation/compensation;Visual/perceptual remediation/compensation;Patient/family education;Balance training    OT Goals(Current goals can be found in the care plan section) Acute Rehab OT Goals Patient Stated Goal: To regain as much function as possible  OT Goal Formulation: With family Time For Goal Achievement: 11/09/16 Potential to Achieve Goals: Good ADL Goals Pt Will Perform Grooming: with mod assist;sitting Pt Will Perform Upper Body Bathing: with mod assist;sitting Pt Will  Transfer to Toilet: with mod assist;stand pivot transfer;bedside commode Additional ADL Goal #1: Pt will sustain attention to simple ADL task x 4 mins with min cues  Additional ADL Goal #2: Pt locate items at midline with min cues   OT Frequency: Min 3X/week   Barriers to D/C: Decreased caregiver support  unsure if family can provide necessary level of care        Co-evaluation              End of Session Equipment Utilized During Treatment: Oxygen Nurse Communication: Mobility status  Activity Tolerance: Patient  tolerated treatment well Patient left: in bed;with call bell/phone within reach;with family/visitor present   Time: [id number] OT Time Calculation (min): 42 min Charges:  OT General Charges $OT Visit: 1 Procedure OT Evaluation $OT Eval Moderate Complexity: 1 Procedure OT Treatments $Neuromuscular Re-education: 23-37 mins G-Codes:    Parag Dorton M 11-07-16, 2:38 PM

## 2016-10-26 NOTE — Evaluation (Signed)
Physical Therapy Evaluation Patient Details Name: Scott Harris MRN: [id number] DOB: May 21, 1966 Today's Date: 10/26/2016   History of Present Illness  pt presents with Progressive Bil Anterior Circulation Infarcts, new L MCA/ACA watershed infarcts, and Severe bil ICA Stenosis.  pt with hx of recent possibleR Frontal Mass, PMH of DM Etoh, and HTN.    Clinical Impression  Pt lethargic and at this time requiring total A for all mobility.  Once in sitting position pt did follow direction to show PT his thumb with 10-15 sec delay, but then perseverates on thumb with each additional direction.  PT then had pt's wife attempt direction and again pt had 10-15 second delay, but does attempt to follow directions with R hand.  Pt's BP up to 205/107 during therapy and HR increased up to a max of 136 while sitting EOB.  Pt was observed bringing gaze to midline with wife cueing.  At this time unsure if pt would be able to participate in CIR level of therapies pending improvement in his lethargy.  Will continue to follow and update D/C plan accordingly.      Follow Up Recommendations CIR    Equipment Recommendations  None recommended by PT    Recommendations for Other Services Rehab consult     Precautions / Restrictions Precautions Precautions: Fall Precaution Comments: NG Tube Restrictions Weight Bearing Restrictions: No      Mobility  Bed Mobility Overal bed mobility: Needs Assistance Bed Mobility: Supine to Sit;Sit to Supine     Supine to sit: Total assist;HOB elevated Sit to supine: Total assist   General bed mobility comments: pt did not participate in bed mobility due to lethargy.  Utilized pad under hips for bringing pt closer to EOB.  pt maintains gaze to R side in sitting with head rotated R.    Transfers                    Ambulation/Gait                Stairs            Wheelchair Mobility    Modified Rankin (Stroke Patients Only) Modified Rankin  (Stroke Patients Only) Pre-Morbid Rankin Score: No symptoms Modified Rankin: Severe disability     Balance Overall balance assessment: Needs assistance Sitting-balance support: Feet supported Sitting balance-Leahy Scale: Zero                                       Pertinent Vitals/Pain Pain Assessment: Faces Faces Pain Scale: No hurt    Home Living Family/patient expects to be discharged to:: Unsure Living Arrangements: Spouse/significant other;Children                    Prior Function Level of Independence: Independent         Comments: pt worked for [employer].     Hand Dominance        Extremity/Trunk Assessment   Upper Extremity Assessment: Defer to OT evaluation           Lower Extremity Assessment: Difficult to assess due to impaired cognition;RLE deficits/detail;LLE deficits/detail RLE Deficits / Details: Sensation intact to soft touch as pt quite ticklish in his feet.  Only active movement noted was in response to tickling.   LLE Deficits / Details: Sensation intact to soft touch as pt quite ticklish in his feet.  Only active  movement noted was in response to tickling.    Cervical / Trunk Assessment: Normal  Communication   Communication:  (No Verbalizations. Voice heard with cough and clearing throa)  Cognition Arousal/Alertness: Lethargic Behavior During Therapy: Flat affect Overall Cognitive Status: Difficult to assess Area of Impairment: Following commands;Attention   Current Attention Level: Focused   Following Commands: Follows one step commands inconsistently       General Comments: pt lethargic, but more aroused when placed in sitting EOB.  pt did attempt to follow one direction from PT to show his thumb, but with 10-15 sec delay and then perseverates on thumb with each subsequent direction.  Had wife ask pt to hold her hand with her hand placed at pt's finger tips with pt making attempt to lift his hand on top  of hers again with 10-15 second delay.      General Comments      Exercises     Assessment/Plan    PT Assessment Patient needs continued PT services  PT Problem List Decreased strength;Decreased activity tolerance;Decreased balance;Decreased mobility;Decreased coordination;Decreased cognition;Decreased knowledge of use of DME;Decreased safety awareness;Cardiopulmonary status limiting activity          PT Treatment Interventions DME instruction;Gait training;Stair training;Functional mobility training;Therapeutic activities;Therapeutic exercise;Balance training;Neuromuscular re-education;Cognitive remediation;Patient/family education    PT Goals (Current goals can be found in the Care Plan section)  Acute Rehab PT Goals Patient Stated Goal: Per family for pt to wake up. PT Goal Formulation: With family Time For Goal Achievement: 11/09/16 Potential to Achieve Goals: Good    Frequency Min 3X/week   Barriers to discharge        Co-evaluation               End of Session   Activity Tolerance: Patient limited by lethargy Patient left: in bed;with call bell/phone within reach;with family/visitor present Nurse Communication: Mobility status;Need for lift equipment         Time: 1055-1130 PT Time Calculation (min) (ACUTE ONLY): 35 min   Charges:   PT Evaluation $PT Eval Moderate Complexity: 1 Procedure PT Treatments $Therapeutic Activity: 8-22 mins   PT G CodesCatarina Hartshorn, PT  [phone] 10/26/2016, 11:54 AM

## 2016-10-27 LAB — BASIC METABOLIC PANEL
ANION GAP: 12 (ref 5–15)
BUN: 15 mg/dL (ref 6–20)
CHLORIDE: 102 mmol/L (ref 101–111)
CO2: 21 mmol/L — AB (ref 22–32)
Calcium: 9.7 mg/dL (ref 8.9–10.3)
Creatinine, Ser: 0.7 mg/dL (ref 0.61–1.24)
GFR calc non Af Amer: >60 mL/min (ref >60)
Glucose, Bld: 264 mg/dL — ABNORMAL HIGH (ref 65–99)
Potassium: 4.3 mmol/L (ref 3.5–5.1)
Sodium: 135 mmol/L (ref 135–145)

## 2016-10-27 LAB — GLUCOSE, CAPILLARY
GLUCOSE-CAPILLARY: 264 mg/dL — AB (ref 65–99)
GLUCOSE-CAPILLARY: 255 mg/dL — AB (ref 65–99)
GLUCOSE-CAPILLARY: 226 mg/dL — AB (ref 65–99)
Glucose-Capillary: 232 mg/dL — ABNORMAL HIGH (ref 65–99)
Glucose-Capillary: 243 mg/dL — ABNORMAL HIGH (ref 65–99)
Glucose-Capillary: 252 mg/dL — ABNORMAL HIGH (ref 65–99)
Glucose-Capillary: 268 mg/dL — ABNORMAL HIGH (ref 65–99)

## 2016-10-27 LAB — CBC
HEMATOCRIT: 45.2 % (ref 39.0–52.0)
HEMOGLOBIN: 15.9 g/dL (ref 13.0–17.0)
MCH: 31.2 pg (ref 26.0–34.0)
MCHC: 35.2 g/dL (ref 30.0–36.0)
MCV: 88.8 fL (ref 78.0–100.0)
Platelets: 248 10*3/uL (ref 150–400)
RBC: 5.09 MIL/uL (ref 4.22–5.81)
RDW: 12.3 % (ref 11.5–15.5)
WBC: 14.7 10*3/uL — AB (ref 4.0–10.5)

## 2016-10-27 MED ORDER — INSULIN ASPART 100 UNIT/ML ~~LOC~~ SOLN
0.0000 [IU] | SUBCUTANEOUS | Status: DC
  Administered 2016-10-27: 5 [IU] via SUBCUTANEOUS
  Administered 2016-10-27 – 2016-10-28 (×3): 8 [IU] via SUBCUTANEOUS
  Administered 2016-10-28 (×3): 5 [IU] via SUBCUTANEOUS
  Administered 2016-10-29: 11 [IU] via SUBCUTANEOUS
  Administered 2016-10-29: 8 [IU] via SUBCUTANEOUS
  Administered 2016-10-29 (×2): 5 [IU] via SUBCUTANEOUS
  Administered 2016-10-29: 8 [IU] via SUBCUTANEOUS
  Administered 2016-10-29: 11 [IU] via SUBCUTANEOUS
  Administered 2016-10-30 (×3): 8 [IU] via SUBCUTANEOUS
  Administered 2016-10-30: 5 [IU] via SUBCUTANEOUS

## 2016-10-27 MED ORDER — VALPROATE SODIUM 500 MG/5ML IV SOLN
500.0000 mg | Freq: Three times a day (TID) | INTRAVENOUS | Status: DC
  Administered 2016-10-27 – 2016-10-29 (×5): 500 mg via INTRAVENOUS
  Filled 2016-10-27 (×11): qty 5

## 2016-10-27 MED ORDER — INSULIN ASPART 100 UNIT/ML ~~LOC~~ SOLN
3.0000 [IU] | Freq: Three times a day (TID) | SUBCUTANEOUS | Status: DC
  Administered 2016-10-27: 3 [IU] via SUBCUTANEOUS

## 2016-10-27 NOTE — Progress Notes (Signed)
Rehab admissions - I left rehab booklets at the bedside.  Patient is currently asleep.  I called his wife and she tells me that she cannot take care of patient after a potential inpatient rehab stay because she works and has 2 teen age daughters.  I would like to see how patient does over the weekend and then see patient again on Monday.  He is not ready for inpatient rehab today.  I will need insurance approval if we decide that inpatient rehab is the path.  Call me for questions.  [id number]

## 2016-10-27 NOTE — Progress Notes (Signed)
   Subjective: No acute events overnight  Objective:  Vital signs in last 24 hours: Vitals:   10/27/16 0336 10/27/16 0500 10/27/16 0700 10/27/16 1100  BP: (!) 188/106  (!) 171/91 (!) 178/104  Pulse: (!) 116  99 (!) 110  Resp: (!) 27  (!) 27 (!) 25  Temp: (!) 100.5 F (38.1 C)  100.1 F (37.8 C) 99 F (37.2 C)  TempSrc: Axillary  Axillary Axillary  SpO2: 92%  97% 94%  Weight:  193 lb 9 oz (87.8 kg)    Height:       Physical Exam  Constitutional: He appears well-developed and well-nourished.  Lying in bed, moving extremities spontaneously. Opens eyes.  Cardiovascular: Normal rate and regular rhythm.  Exam reveals no gallop and no friction rub.   No murmur heard. Respiratory: Effort normal and breath sounds normal. No respiratory distress. He has no wheezes.  GI: Soft. Bowel sounds are normal. He exhibits no distension. There is no tenderness.  Musculoskeletal: He exhibits no edema.  Neurological:  Patient was able to follow minimal commands today and was able to squeeze my hands on the right side and move his right lower extremity to commands. He is moving all 4 of his extremities spontaneously. He did not move his left upper or lower extremities spontaneously. His right motor strength in the lower extremity appears greater than left.     Assessment/Plan:  Principal Problem:   CVA (cerebral vascular accident) ([place]) Active Problems:   Essential hypertension with goal blood pressure less than 130/80   Type 2 diabetes mellitus with complication (HCC)   Altered mental status   Alcoholism ([place])   Seizures (HCC)   Cerebral thrombosis with cerebral infarction  50 year old gentleman with acute on subacute neurologic impairment found to have bilateral CVA secondary to bilateral ICA occlusion.  1. Bilateral frontal stroke and ICA occlusion -- Stroke team following -- Aspirin 325 mg once daily -- Clopidogrel 75 mg daily -- Atorvastatin 80 mg daily -- Per stroke team restart  valproic acid, 500 mg 3 times a day -- Per stroke team methylphenidate 10 mg twice a day -- Follow-up physical therapy and physical medicine and rehabilitation recommendations -- If patient is unable to swallow by Monday will proceed with PEG tube  2. Elevated ammonia Patient has mildly elevated ammonia with a concern for hepatic encephalopathy contributing to his lethargy. -- Continue lactulose twice a day  3. HTN Allowing permissive hypertension in the setting of an acute ischemic infarct with a goal systolic of 0000000. Most recent blood pressure of 188/106. -- Hold lisinopril  4. Diabetes Hemoglobin A1c of 8.5%. The patient was taking sitagliptin-metformin once daily at home. -- Lantus 25 units daily -- Sliding scale insulin  DVT/PE prophylaxis: Lovenox FEN/GI: Nutrition per tube  Dispo: Anticipated discharge contingent on clinical course.   Ophelia Shoulder, MD 10/27/2016, 1:06 PM Pager: [phone]

## 2016-10-27 NOTE — Progress Notes (Signed)
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 Patient was not considered for IV t-PA. Due to late presentation and stroke not initially suspected   SUBJECTIVE (INTERVAL HISTORY) His wife and mother are  at the bedside. He is more easily arousable. No other changes overnight. He partially opens eyes and is more purposeful but not following commands   OBJECTIVE Temp:  [98.6 F (37 C)-100.5 F (38.1 C)] (P) 100.1 F (37.8 C) (12/01 0700) Pulse Rate:  [113-120] 116 (12/01 0336) Cardiac Rhythm: Normal sinus rhythm (12/01 0400) Resp:  [23-27] 27 (12/01 0336) BP: (177-222)/(105-113) 188/106 (12/01 0336) SpO2:  [92 %-96 %] 92 % (12/01 0336) Weight:  [87.8 kg (193 lb 9 oz)] 87.8 kg (193 lb 9 oz) (12/01 0500)  CBC:  Recent Labs Lab 10/20/16 1322  09/29/2016 0806  10/25/16 0350 10/27/16 0500  WBC 7.7  --  5.7  < > 13.8* 14.7*  NEUTROABS 5.0  --  5.0  --   --   --   HGB 16.5  < > 16.3  < > 15.9 15.9  HCT 45.6  < > 45.7  < > 45.1 45.2  MCV 87.4  --  86.6  < > 88.1 88.8  PLT 264  --  252  < > 255 248  < > = values in this interval not displayed.  Basic Metabolic Panel:   Recent Labs Lab 10/22/16 0404 10/25/16 0350  NA 137 139  K 4.4 3.9  CL 101 104  CO2 24 23  GLUCOSE 222* 212*  BUN 15 14  CREATININE 0.81 0.71  CALCIUM 9.9 9.8  MG 2.0  --     Lipid Panel:     Component Value Date/Time   CHOL 172 10/22/2016 1425   TRIG 157 (H) 10/22/2016 1425   HDL 51 10/22/2016 1425   CHOLHDL 3.4 10/22/2016 1425   VLDL 31 10/22/2016 1425   LDLCALC 90 10/22/2016 1425    HgbA1c:  Lab Results  Component Value Date   HGBA1C 8.5 (H) 10/22/2016   Urine Drug Screen:     Component Value Date/Time   LABOPIA NONE DETECTED 10/20/2016 1437   COCAINSCRNUR NONE DETECTED 10/20/2016 1437   LABBENZ NONE DETECTED 10/20/2016 1437   AMPHETMU NONE DETECTED 10/20/2016 1437   THCU NONE DETECTED 10/20/2016 1437   LABBARB NONE DETECTED 10/20/2016 1437      IMAGING  No results found. EEG   Diffuse slow activity, max over left temporal regions some improvement over the course of the recording.   PHYSICAL EXAM Middle-aged Caucasian male who is not in distress. . Afebrile. Head is nontraumatic. Neck is supple without bruit.    Cardiac exam no murmur or gallop. Lungs are clear to auscultation. Distal pulses are well felt. Neurological Exam : Patient is stuporous. In response to sternal rub purposeful activity on the right. Eyes are closed. Pupils are equal 4 mm reactive. Fundi could not be visualized. Vision acuity and fields cannot be tested reliably. Patient moves right side purposefully against gravity without weakness. He has mild left-sided weakness but is able to move left arm and left leg against gravity but not to the  same degree as the right side. He does not follow any commands. He moans and groans but doesn't does not have any spontaneous speech and does not follow commands. Deep tendon reflexes are 2+ symmetric.  Plantars have an ongoing response ASSESSMENT/PLAN [name] is a 50 y.o. male with history of  newly diagnosed R frontal mass, DM2, ETOH abuse HLD and HTN  presenting with confusion, L sided numbness and tingling x 2 weeks, neuro consulted for possible seizure.   Stroke:  Progressive bilateral anterior circulation infarcts in setting of severe bilateral ICA disease - Has uncontrolled DM II  R anterior circulation white matter  subacute infarct  1-2 weeks ago New L MCA/ACA watershed infarct   Resultant  R sided weakness, lethargy, not following  commands, dysphagia.   MRI head  Progressive B anterior circulation infarcts, L MCA/ACA infarct.  MRI CS mild to moderate degenerative cervical foraminal stenosis  CTA head and neck  Functional occlusion B ICAs with reconstitution. 2D Echo  Left ventricle: The cavity size was normal. Wall thickness was   normal. Systolic function was vigorous. The estimated ejection   fraction was in the range of 65% to 70%. Wall motion was normal;    there were no regional wall motion abnormalities  LDL 90  HgbA1c 8.5  SCDs for VTE prophylaxis Diet NPO time specified Except for: Sips with Meds  No antithrombotic prior to admission, now on aspirin 325 mg daily and clopidogrel 75 mg daily  Ongoing aggressive stroke risk factor management  Taper steroids underway  Therapy recommendations:  CLR  Disposition:  CLR (lives w/ wife, works as a Patent examiner)  AMS Unclear etiology. Could be from the stroke. Less likely seizure. Checked Ammonia level as patient received few doses of valproic acid along and keppra prevoiusly. It's mildly elevated. Does not seem to have liver abnormalities al though he does have heavy drinking hx.  - will do trial of lactulose - also added Ritalin 5mg  bid 6am and 10 am. We went back to check on him after first dose today and he is opening his eyes more than before.  On valproic acid, level is therapeutic. Consider tapering off.  Carotid Disease, bilateral aniogram done by Dr. Estanislado Pandy on 11/27 1. 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 3.Approx 80 to 85 % stenosis of RT VA at origin. 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  Treatment per primary team.   Alcohol Abuse  Heavy drinker per wife  Recent fall on 11/9 after birthday party  [place] again last week  CIWA protocol. Would highly encourage avoiding sedatives as much as possible.   Dysphagia  Secondary to stroke  Placed NG for aspriin and plavix  Other Stroke Risk Factors  Cigarette smoker, advised to stop smoking  Overweight, Body mass index is 29.43 kg/m., recommend weight loss, diet and exercise as appropriate  obstructive sleep apnea, inconsistent use of CPAP at home  Elevated ammonia - on lactulose   Hospital day #  6    I have personally examined this patient, reviewed notes, independently viewed imaging studies, participated in medical decision making and plan of care.ROS completed by me personally and pertinent positives fully documented  I have made any additions or clarifications directly to the above note. Agree with note above. Continue Ritalin dose at 10 mg at 6 am and 10 am.anticipate slow gradual improvement over: The next few weeks. Transfer to medical floor bed today and back to early next week if swallowing function does not improve. Greater than 50% time during this 35 minute visit was spent on counseling and coordination of care about his strokes, arousal, dysphagia and answering questions Discussed with wife and Dr Heber [place] and his team at the bedside and answered questions.     Antony Contras, MD Medical Director [place] Pager: [phone] 10/27/2016 2:06 PM

## 2016-10-27 NOTE — Progress Notes (Signed)
Results for [name], [name] (MRN [id number]) as of 10/27/2016 10:13  Ref. Range 10/27/2016 00:38 10/27/2016 03:38 10/27/2016 08:25  Glucose-Capillary Latest Ref Range: 65 - 99 mg/dL 232 (H) 264 (H) 255 (H)   Noted that blood sugars continue to be elevated and greater than 180 mg/dl. Recommend starting Novolog 3-4 units every 4 hours along with Lantus and correction scale to cover tube feedings if blood sugars continue to be elevated. May need to increase Novolog correction scale to MODERATE every 4 hours. Will continue to monitor blood sugars while in the hospital. Harvel Ricks RN BSN CDE

## 2016-10-27 NOTE — Progress Notes (Signed)
Nutrition Follow-up  DOCUMENTATION CODES:   Not applicable  INTERVENTION:    Continue Glucerna 1.2 at 70 ml/h to provide 2016 kcals, 101 gm protein, 1352 ml free water daily.  NUTRITION DIAGNOSIS:   Inadequate oral intake related to inability to eat as evidenced by NPO status.  Ongoing  GOAL:   Patient will meet greater than or equal to 90% of their needs  Met  MONITOR:   Labs, Weight trends, TF tolerance, I & O's  REASON FOR ASSESSMENT:   Consult Enteral/tube feeding initiation and management  ASSESSMENT:   50yo man with PMH of DM2, HTN, HLD who presented with slurred speech, lethargy and confusion with CVA.  Patient is currently receiving Glucerna 1.2 via NGT (tip in stomach) at 70 ml/h (1680 ml/day) to provide 2016 kcals, 101 gm protein, 1352 ml free water daily.  Plans for swallow evaluation with SLP soon, but may require G-tube in the near future if unable to safely swallow. Labs reviewed. CBG's: [id number] Medications reviewed and include lactulose and thiamine.  Diet Order:  Diet NPO time specified Except for: Sips with Meds  Skin:  Reviewed, no issues  Last BM:  11/25  Height:   Ht Readings from Last 1 Encounters:  10/24/2016 _0  (1.727 m)    Weight:   Wt Readings from Last 1 Encounters:  10/27/16 193 lb 9 oz (87.8 kg)    Ideal Body Weight:  70 kg  BMI:  Body mass index is 29.43 kg/m.  Estimated Nutritional Needs:   Kcal:  1900-2100  Protein:  90-110 gm  Fluid:  >/= 2 L  EDUCATION NEEDS:   No education needs identified at this time  Molli Barrows, [place], [place], [place] Pager [phone] After Hours Pager [phone]

## 2016-10-27 NOTE — Progress Notes (Signed)
Pt transferred to 5W. Pt in stable condition. Telemetry placed, CCMD notified. Bed alarm on, family at bedside. Fluid rates checked. Will continue to monitor.

## 2016-10-27 NOTE — Evaluation (Signed)
Clinical/Bedside Swallow Evaluation Patient Details  Name: Scott Harris MRN: [id number] Date of Birth: [birth year]  Today's Date: 10/27/2016 Time: SLP Start Time (ACUTE ONLY): 1425 SLP Stop Time (ACUTE ONLY): 1436 SLP Time Calculation (min) (ACUTE ONLY): 11 min  Past Medical History:  Past Medical History:  Diagnosis Date  . Diabetes mellitus without complication ([place])   . Hyperlipemia   . Hypertension    Past Surgical History:  Past Surgical History:  Procedure Laterality Date  . CHOLECYSTECTOMY    . HAND SURGERY    . IR GENERIC HISTORICAL  10/23/2016   IR ANGIO INTRA EXTRACRAN SEL COM CAROTID INNOMINATE BILAT MOD SED 10/23/2016 Luanne Bras, MD [facility]-INTERV RAD  . TONSILLECTOMY     HPI:  Scott Harris is a 50 y.o. male, very unusual presentation. Initially was felt to potentially have a brain mass. He returned to the hospital with worsening neurologic condition. Further evaluation revealed bilateral cerebral strokes. CT angiogram and MRI revealed subtotal or totally occluded carotid arteries bilaterally. He did undergo cerebral arteriogram    Assessment / Plan / Recommendation Clinical Impression  Pt referred for clinical assessment of swallowing function following admission for B CVAs. Pt's ability to participate in formal swallow assessment was extremely limited as he demonstrated an entire body shudder and dental clenching when ice chips introduced to his lips. No response to intraoral stimulation in an attempt to elicit pharyngeal response. Pt did maintain eye opening and tracked to the L to find speaker with verbal cueing. Pt also followed directives to move his toes bilaterally x 3 and pumped his foot x2 to request. Certainly pt should be strictly NPO at this time with all nutrition and meds via NG. Also, consideration for cognitive-linguistic evaluation with SLP services. Will follow.     Aspiration Risk  Severe aspiration risk    Diet Recommendation NPO;Alternative means -  long-term   Medication Administration: Via alternative means    Other  Recommendations Oral Care Recommendations: Oral care BID   Follow up Recommendations Inpatient Rehab;24 hour supervision/assistance (pending progress)      Frequency and Duration min 2x/week  4 weeks       Prognosis Prognosis for Safe Diet Advancement: Fair Barriers to Reach Goals: Cognitive deficits      Swallow Study   General Date of Onset: 09/27/2016 HPI: Scott Harris is a 50 y.o. male, very unusual presentation. Initially was felt to potentially have a brain mass. He returned to the hospital with worsening neurologic condition. Further evaluation revealed bilateral cerebral strokes. CT angiogram and MRI revealed subtotal or totally occluded carotid arteries bilaterally. He did undergo cerebral arteriogram  Type of Study: Bedside Swallow Evaluation Previous Swallow Assessment: none Diet Prior to this Study: NPO;NG Tube Temperature Spikes Noted: Yes Respiratory Status: Room air History of Recent Intubation: No Behavior/Cognition: Lethargic/Drowsy;Requires cueing Oral Cavity Assessment: Excessive secretions Oral Care Completed by SLP: Yes Oral Cavity - Dentition: Adequate natural dentition Self-Feeding Abilities: Total assist Patient Positioning: Partially reclined Baseline Vocal Quality: Not observed Volitional Cough: Cognitively unable to elicit Volitional Swallow: Unable to elicit    Oral/Motor/Sensory Function Overall Oral Motor/Sensory Function:  (unable to participate with assessment)   Ice Chips Ice chips: Impaired Oral Phase Impairments: Poor awareness of bolus Pharyngeal Phase Impairments: Unable to trigger swallow   Thin Liquid      Nectar Thick     Honey Thick     Puree     Solid   GO  [place], [place] Pager [phone] 10/27/2016,2:49 PM

## 2016-10-27 DEATH — deceased

## 2016-10-28 ENCOUNTER — Inpatient Hospital Stay (HOSPITAL_COMMUNITY): Payer: 59

## 2016-10-28 ENCOUNTER — Encounter (HOSPITAL_COMMUNITY): Payer: Self-pay | Admitting: Physician Assistant

## 2016-10-28 DIAGNOSIS — D72829 Elevated white blood cell count, unspecified: Secondary | ICD-10-CM

## 2016-10-28 DIAGNOSIS — R401 Stupor: Secondary | ICD-10-CM

## 2016-10-28 LAB — CBC WITH DIFFERENTIAL/PLATELET
BASOS ABS: 0 10*3/uL (ref 0.0–0.1)
BASOS PCT: 0 %
EOS ABS: 0.4 10*3/uL (ref 0.0–0.7)
EOS PCT: 3 %
HCT: 45.4 % (ref 39.0–52.0)
Hemoglobin: 16.3 g/dL (ref 13.0–17.0)
Lymphocytes Relative: 13 %
Lymphs Abs: 1.5 10*3/uL (ref 0.7–4.0)
MCH: 31.7 pg (ref 26.0–34.0)
MCHC: 35.9 g/dL (ref 30.0–36.0)
MCV: 88.3 fL (ref 78.0–100.0)
MONO ABS: 1.3 10*3/uL — AB (ref 0.1–1.0)
Monocytes Relative: 12 %
Neutro Abs: 8.4 10*3/uL — ABNORMAL HIGH (ref 1.7–7.7)
Neutrophils Relative %: 72 %
PLATELETS: 251 10*3/uL (ref 150–400)
RBC: 5.14 MIL/uL (ref 4.22–5.81)
RDW: 12.3 % (ref 11.5–15.5)
WBC: 11.6 10*3/uL — AB (ref 4.0–10.5)

## 2016-10-28 LAB — APTT: APTT: 30 s (ref 24–36)

## 2016-10-28 LAB — CBC
HCT: 45.9 % (ref 39.0–52.0)
Hemoglobin: 16.2 g/dL (ref 13.0–17.0)
MCH: 31 pg (ref 26.0–34.0)
MCHC: 35.3 g/dL (ref 30.0–36.0)
MCV: 87.9 fL (ref 78.0–100.0)
PLATELETS: 270 10*3/uL (ref 150–400)
RBC: 5.22 MIL/uL (ref 4.22–5.81)
RDW: 12.3 % (ref 11.5–15.5)
WBC: 15 10*3/uL — AB (ref 4.0–10.5)

## 2016-10-28 LAB — COMPREHENSIVE METABOLIC PANEL
ALT: 33 U/L (ref 17–63)
AST: 20 U/L (ref 15–41)
Albumin: 3.2 g/dL — ABNORMAL LOW (ref 3.5–5.0)
Alkaline Phosphatase: 75 U/L (ref 38–126)
Anion gap: 9 (ref 5–15)
BUN: 16 mg/dL (ref 6–20)
CHLORIDE: 103 mmol/L (ref 101–111)
CO2: 24 mmol/L (ref 22–32)
CREATININE: 0.69 mg/dL (ref 0.61–1.24)
Calcium: 9.7 mg/dL (ref 8.9–10.3)
GFR calc Af Amer: >60 mL/min (ref >60)
GFR calc non Af Amer: >60 mL/min (ref >60)
Glucose, Bld: 275 mg/dL — ABNORMAL HIGH (ref 65–99)
POTASSIUM: 4.2 mmol/L (ref 3.5–5.1)
SODIUM: 136 mmol/L (ref 135–145)
Total Bilirubin: 0.5 mg/dL (ref 0.3–1.2)
Total Protein: 6.8 g/dL (ref 6.5–8.1)

## 2016-10-28 LAB — URINALYSIS, ROUTINE W REFLEX MICROSCOPIC
BILIRUBIN URINE: NEGATIVE
Glucose, UA: >1000 mg/dL — AB
Hgb urine dipstick: NEGATIVE
KETONES UR: 40 mg/dL — AB
Leukocytes, UA: NEGATIVE
NITRITE: NEGATIVE
PROTEIN: 30 mg/dL — AB
SPECIFIC GRAVITY, URINE: 1.039 — AB (ref 1.005–1.030)
pH: 7 (ref 5.0–8.0)

## 2016-10-28 LAB — GLUCOSE, CAPILLARY
GLUCOSE-CAPILLARY: 266 mg/dL — AB (ref 65–99)
GLUCOSE-CAPILLARY: 252 mg/dL — AB (ref 65–99)
GLUCOSE-CAPILLARY: 256 mg/dL — AB (ref 65–99)
GLUCOSE-CAPILLARY: 251 mg/dL — AB (ref 65–99)
GLUCOSE-CAPILLARY: 244 mg/dL — AB (ref 65–99)
Glucose-Capillary: 249 mg/dL — ABNORMAL HIGH (ref 65–99)

## 2016-10-28 LAB — PROTIME-INR
INR: 0.98
PROTHROMBIN TIME: 13 s (ref 11.4–15.2)

## 2016-10-28 LAB — LACTIC ACID, PLASMA
LACTIC ACID, VENOUS: 0.9 mmol/L (ref 0.5–1.9)
LACTIC ACID, VENOUS: 0.9 mmol/L (ref 0.5–1.9)

## 2016-10-28 LAB — AMMONIA: Ammonia: 74 umol/L — ABNORMAL HIGH (ref 9–35)

## 2016-10-28 LAB — URINE MICROSCOPIC-ADD ON: RBC / HPF: NONE SEEN RBC/hpf (ref 0–5)

## 2016-10-28 LAB — PROCALCITONIN: Procalcitonin: <0.1 ng/mL

## 2016-10-28 MED ORDER — CLOPIDOGREL BISULFATE 75 MG PO TABS: 75.0000 mg | ORAL_TABLET | Freq: Every day | ORAL | Status: DC

## 2016-10-28 MED ORDER — INSULIN ASPART 100 UNIT/ML ~~LOC~~ SOLN
4.0000 [IU] | Freq: Three times a day (TID) | SUBCUTANEOUS | Status: DC
  Administered 2016-10-28 – 2016-10-29 (×5): 4 [IU] via SUBCUTANEOUS

## 2016-10-28 MED ORDER — PIPERACILLIN-TAZOBACTAM 3.375 G IVPB 30 MIN
3.3750 g | Freq: Three times a day (TID) | INTRAVENOUS | Status: DC
  Administered 2016-10-28 – 2016-10-29 (×3): 3.375 g via INTRAVENOUS
  Filled 2016-10-28 (×5): qty 50

## 2016-10-28 MED ORDER — SODIUM CHLORIDE 0.9 % IV SOLN
INTRAVENOUS | Status: DC
  Administered 2016-10-28 – 2016-11-04 (×6): via INTRAVENOUS

## 2016-10-28 NOTE — Progress Notes (Signed)
Referring Physician(s): Ophelia Shoulder  Supervising Physician: Daryll Brod  Patient Status:  [facility] - In-pt  Chief Complaint:  Dysphagia secondary to CVA  HPI:  Scott Harris is known to our service.  Dr. Estanislado Pandy perform cerebral angiogram which revealed occluded ICAs and stenotic Rt Vertebral artery.  We are asked evaluate for placement of percutaneous gastrostomy tube  He is currently on Plavix, ASA, and Lovenox.  His wife is at the bedside during my visit today.  Allergies: Patient has no known allergies.  Medications: Prior to Admission medications   Medication Sig Start Date End Date Taking? Authorizing Provider  atorvastatin (LIPITOR) 10 MG tablet Take 5 mg by mouth daily.   Yes Historical Provider, MD  CIALIS 5 MG tablet TAKE ONE TABLET (5 MG TOTAL) BY MOUTH DAILY AS NEEDED FOR ERECTILE DYSFUNCTION. 08/17/16  Yes Historical Provider, MD  dexamethasone (DECADRON) 4 MG tablet Take 2 tablets (8 mg total) by mouth 2 (two) times daily. 10/20/16  Yes Charlesetta Shanks, MD  FLUoxetine (PROZAC) 20 MG capsule Take 20 mg by mouth every morning.  09/04/16  Yes Historical Provider, MD  gabapentin (NEURONTIN) 300 MG capsule Take 600 mg by mouth at bedtime.   Yes Historical Provider, MD  ibuprofen (ADVIL,MOTRIN) 200 MG tablet Take 400 mg by mouth every 4 (four) hours as needed for moderate pain.   Yes Historical Provider, MD  lansoprazole (PREVACID) 30 MG capsule Take 30 mg by mouth every morning.  06/19/16  Yes Historical Provider, MD  levETIRAcetam (KEPPRA) 500 MG tablet Take 1 tablet (500 mg total) by mouth 2 (two) times daily. 10/20/16  Yes Charlesetta Shanks, MD  lisinopril (PRINIVIL,ZESTRIL) 2.5 MG tablet Take 2.5 mg by mouth daily.   Yes Historical Provider, MD  methocarbamol (ROBAXIN) 500 MG tablet Can take up to 1-2 tabs every 6 hours PRN PAIN Patient taking differently: Take 500-1,000 mg by mouth every 6 (six) hours as needed for muscle spasms.  10/13/16  Yes Nicole Pisciotta, PA-C    omeprazole (PRILOSEC) 20 MG capsule Take 1 capsule (20 mg total) by mouth daily. 10/20/16  Yes Charlesetta Shanks, MD  oxyCODONE-acetaminophen (PERCOCET) 5-325 MG tablet Take 1 tablet by mouth every 4 (four) hours as needed. Patient taking differently: Take 1 tablet by mouth every 4 (four) hours as needed for severe pain.  10/13/16  Yes Nicole Pisciotta, PA-C  SitaGLIPtin-MetFORMIN HCl 50-1000 MG TB24 Take 1 tablet by mouth every morning. 06/19/16  Yes Historical Provider, MD  TESTIM 50 MG/5GM (1%) GEL APPLY 1 PACKET ONTO THE SKIN EACH MORNING TO SHOULDER AND UPPER ARM ALLOW TO DRY 5 MIN BEFORE DRESSI 09/15/16  Yes Historical Provider, MD     Vital Signs: BP (!) 179/103 (BP Location: Right Arm)   Pulse (!) 110   Temp 99.1 F (37.3 C) (Axillary)   Resp (!) 22   Ht 5\' 8"  (1.727 m)   Wt 187 lb 12.8 oz (85.2 kg)   SpO2 94%   BMI 28.55 kg/m   Physical Exam Pt is very somnolent He will open his eyes but does not follow commands well His wife states "he did good for the occupational therapist" He will not open his mouth, he clenches his teeth Heart is Regular Lungs clear Abdomen soft, NTND Scar on RUQ c/w open cholecystectomy years ago  Imaging: Ct Head Wo Contrast  Result Date: 10/25/2016 CLINICAL DATA:  Acute presentation with confusion and bilateral lower extremity weakness. Stroke with intervention. EXAM: CT HEAD WITHOUT CONTRAST TECHNIQUE: Contiguous axial  images were obtained from the base of the skull through the vertex without intravenous contrast. COMPARISON:  10/22/2016 FINDINGS: Brain: Persistent low-density throughout the acute bilateral infarctions affecting the right frontal deep white matter in the left frontal cortical and subcortical brain. Involvement of the left basal ganglia appear slightly more pronounced than seen previously. Mild swelling in the region of infarction but no hemorrhage or mass effect resulting in shift. No posterior circulation insult identified. No  hydrocephalus or extra-axial collection. Vascular: No vascular finding by CT. Skull: Negative Sinuses/Orbits: Mild sinus mucosal inflammation again demonstrated most notably in the right maxillary sinus. Orbits negative. Other: None significant IMPRESSION: Low-density in the known infarctions affecting the right frontal deep white matter in the left frontal cortical and subcortical brain. No sign of hemorrhagic transformation or significant mass effect/shift. The seems to be a little more involvement in the left basal ganglia by today's CT. Electronically Signed   By: Nelson Chimes M.D.   On: 10/25/2016 06:34    Labs:  CBC:  Recent Labs  10/22/16 0404 10/25/16 0350 10/27/16 0500 10/28/16 0503  WBC 10.4 13.8* 14.7* 15.0*  HGB 16.1 15.9 15.9 16.2  HCT 45.3 45.1 45.2 45.9  PLT 276 255 248 270    COAGS:  Recent Labs  10/20/16 1322  INR 0.89  APTT 26    BMP:  Recent Labs  10/24/2016 0806 10/22/16 0404 10/25/16 0350 10/27/16 0500  NA 133* 137 139 135  K 4.6 4.4 3.9 4.3  CL 101 101 104 102  CO2 22 24 23  21*  GLUCOSE 292* 222* 212* 264*  BUN 17 15 14 15   CALCIUM 9.7 9.9 9.8 9.7  CREATININE 0.87 0.81 0.71 0.70  GFRNONAA >60 >60 >60 >60  GFRAA >60 >60 >60 >60    LIVER FUNCTION TESTS:  Recent Labs  10/20/16 1322  BILITOT 0.7  AST 42*  ALT 97*  ALKPHOS 78  PROT 8.1  ALBUMIN 4.9   ASA = 3 Mallampati= unable to open patient's mouth (clenches teeth)  Assessment and Plan:  Dysphagia secondary to CVA  Will plan for percutaneous gastrostomy tube next Wednesday after Plavix has been held x 5 days per our protocol  Discussed holding Plavix with Dr. Estanislado Pandy and he has Ok'd this.  Risks and Benefits discussed with the patient's wife including, but not limited to the need for a barium enema during the procedure, bleeding, infection, peritonitis, or damage to adjacent structures.  All of the patient's wife's questions were answered, patient is agreeable to  proceed. Consent signed and in chart.   Electronically Signed: Murrell Redden PA-C 10/28/2016, 11:01 AM   I spent a total of 15 Minutes at the the patient's bedside AND on the patient's hospital floor or unit, greater than 50% of which was counseling/coordinating care for gastrostomy tube placement

## 2016-10-28 NOTE — Progress Notes (Signed)
   Subjective: No acute events overnight.   Objective:  Vital signs in last 24 hours: Vitals:   10/28/16 0433 10/28/16 0606 10/28/16 0858 10/28/16 1033  BP: (!) 219/117 (!) 163/89  (!) 179/103  Pulse: (!) 120 (!) 113  (!) 110  Resp: 19 20  (!) 22  Temp: 98.8 F (37.1 C) 100.3 F (37.9 C)  99.1 F (37.3 C)  TempSrc: Oral Oral  Axillary  SpO2: 95% 92%  94%  Weight:   187 lb 12.8 oz (85.2 kg)   Height:       Physical Exam  Constitutional: He appears well-developed and well-nourished.  Cardiovascular: Normal rate, regular rhythm and intact distal pulses.   Respiratory: Effort normal and breath sounds normal.  Anterior lung fields clear to auscultation   GI: Soft. Bowel sounds are normal. He exhibits no distension. There is no tenderness.  Musculoskeletal: He exhibits no edema.  Neurological:  Patient was opening his eyes and able to follow minimal commands on 10/27/16 such as being able to squeeze my hand on the right side and wiggle his toes on his right lower extremity. Today, he is not opening his eyes and not following verbal commands. He is responding to painful stimuli only.      Assessment/Plan:  Principal Problem:   CVA (cerebral vascular accident) ([place]) Active Problems:   Essential hypertension with goal blood pressure less than 130/80   Type 2 diabetes mellitus with complication ([facility])   Altered mental status   Alcoholism ([place])   Seizures ([facility])   Cerebral thrombosis with cerebral infarction  50 year old gentleman with acute on subacute neurologic impairment found to have bilateral CVA secondary to bilateral ICA occlusion.  Bilateral frontal stroke and ICA occlusion Neurological function appears worsened today. Patient is no longer opening his eyes or following verbal commands. He is only responding to painful stimuli. CBG was checked at bedside (252). -- Stroke team following --Repeat head CT to r/o hemorrhagic transformation  -- Aspirin 325 mg once daily --  HOLD Clopidogrel 75 mg daily for 5 days per IR recommendation. PEG tube planned for 11/01/16.  -- Atorvastatin 80 mg daily -- Valproic acid 500 mg 3 times a day -- Methylphenidate 10 mg twice a day -- Follow-up physical therapy and physical medicine and rehabilitation recommendations  Leukocytosis  Patient has been having mild persistent leukocytosis for the past 3 days. Most recent white count 15.0. Tmax 100.3 in the past 24 hrs. This raises concern for infection which could possibly explain the deterioration of his neurological function.  --Will order CXR and UA to r/o source of infection   Elevated ammonia Patient has mildly elevated ammonia with a concern for hepatic encephalopathy contributing to his lethargy. -- Continue lactulose twice a day  HTN Allowing permissive hypertension in the setting of an acute ischemic infarct. Goal BP <220/120.  -- Hold lisinopril  4. Diabetes Hemoglobin A1c of 8.5%. The patient was taking sitagliptin-metformin once daily at home. -- Lantus 25 units daily -- Sliding scale insulin  DVT/PE prophylaxis: Lovenox FEN/GI: Nutrition per tube  Dispo: Anticipated discharge contingent on clinical course.   Shela Leff, MD 10/28/2016, 12:30 PM Pager: [phone]

## 2016-10-28 NOTE — Clinical Social Work Note (Signed)
Clinical Social Work Assessment  Patient Details  Name: Scott Harris MRN: [id number] Date of Birth: 02/29/1966  Date of referral:  10/28/16               Reason for consult:  Discharge Planning, Facility Placement, Emotional/Coping/Adjustment to Illness                Permission sought to share information with:  Family Supports, Chartered certified accountant granted to share information::  No (Patient is unable to give permission.)  Name::     Pharmacist, community::  SNFs - prefers Health and safety inspector  Relationship::  Wife only  Sport and exercise psychologist Information:     Housing/Transportation Living arrangements for the past 2 months:  Single Family Home Source of Information:  Spouse Patient Interpreter Needed:  None Criminal Activity/Legal Involvement Pertinent to Current Situation/Hospitalization:  No - Comment as needed Significant Relationships:  Spouse Lives with:  Spouse Do you feel safe going back to the place where you live?  No Need for family participation in patient care:  Yes (Comment)  Care giving concerns:  The patient's wife cannot manage the patient's needs at home. She requests that we look into SNF placement as she doesn't feel CIR is going to adequately address his needs.    Social Worker assessment / plan:  CSW met with patient's wife Scott Harris at bedside. The patient is unable to contribute to the assessment at this time. The patient's wife becomes teary eyed when discussing situation. She is noticeably overwhelmed by the changes that have taken place in her life within the past two weeks. CSW provided emotional support to the wife and explained that these feelings of being overwhelmed are common and that we are here to support her through this transition.   The patient's wife states that she agrees with need to look at SNF placement because she sees that the patient will need more time for recovery and rehab. CSW explained SNF search/placementp process and answered the wife's  questions. Wife states that she would prefer [facility] due to location. She states that PEG tube placement is planned for  12/7. CSW will followup with bed offers once available.   The patient's wife requests to speak with a financial counselor regarding disability and applying on the patient's behalf. The wife is also considering switching the patient to her insurance plan ([employer]) at the end of the month if this makes more sense financially. CSW will leave report for weekend CSW.    Employment status:  Therapist, music:  Managed Care PT Recommendations:  Inpatient Rehab Consult Information / Referral to community resources:  [place]  Patient/Family's Response to care:  The patient's wife is happy with the care the patient's has received. She appreciates the support and assistance of CSW with placement.  Patient/Family's Understanding of and Emotional Response to Diagnosis, Current Treatment, and Prognosis:  The patient's wife appears to have a good understanding of the patient's condition and prognosis. The patient's wife appears to be doing as well as can be expected given the circumstances. CSW will continue to be available to provide support to the patient and wife.   Emotional Assessment Appearance:  Appears stated age Attitude/Demeanor/Rapport:  Unable to Assess Affect (typically observed):  Unable to Assess Orientation:    Alcohol / Substance use:  Not Applicable Psych involvement (Current and /or in the community):  No (Comment)  Discharge Needs  Concerns to be addressed:  Discharge Planning Concerns Readmission within the last  30 days:  No Current discharge risk:  Chronically ill, Physical Impairment Barriers to Discharge:  Continued Medical Work up   Rigoberto Noel, LCSW 10/28/2016, 2:06 PM

## 2016-10-28 NOTE — NC FL2 (Signed)
[place] LEVEL OF CARE SCREENING TOOL     IDENTIFICATION  Patient Name: Scott Harris Birthdate: 19-Aug-1966 Sex: male Admission Date (Current Location): 10/01/2016  [facility] and [place] Number:  Herbalist and Address:  [facility], [place] [address] [place], [place] [address]      Provider Number: [id number]  Attending Physician Name and Address:  Lucious Groves, DO  Relative Name and Phone Number:       Current Level of Care: Hospital Recommended Level of Care: [place] Prior Approval Number:    Date Approved/Denied:   PASRR Number:    Discharge Plan: SNF    Current Diagnoses: Patient Active Problem List   Diagnosis Date Noted  . Cerebral thrombosis with cerebral infarction 10/23/2016  . CVA (cerebral vascular accident) ([place]) 10/12/2016  . Type 2 diabetes mellitus with complication ([place]) [id number]  . Altered mental state   . Alcoholism ([place])   . Seizures ([place])   . Hypotestosteronism 10/16/2016  . Erectile dysfunction due to diseases classified elsewhere 05/26/2015  . Essential hypertension with goal blood pressure less than 130/80 05/26/2015  . Gastroesophageal reflux disease without esophagitis 05/26/2015  . Hyperlipidemia LDL goal <70 05/26/2015  . Moderate obstructive sleep apnea 04/23/2014    Orientation RESPIRATION BLADDER Height & Weight        Normal Incontinent, Indwelling catheter Weight: 85.2 kg (187 lb 12.8 oz) Height:  5\' 8"  (172.7 cm)  BEHAVIORAL SYMPTOMS/MOOD NEUROLOGICAL BOWEL NUTRITION STATUS   (NONE)  (NONE) Incontinent Feeding tube (PEG to be placed 12/7)  AMBULATORY STATUS COMMUNICATION OF NEEDS Skin   Extensive Assist Verbally Surgical wounds (Incision groin right)                       Personal Care Assistance Level of Assistance  Bathing, Feeding, Dressing Bathing Assistance: Maximum assistance Feeding assistance: Maximum assistance Dressing Assistance: Maximum  assistance     Functional Limitations Info  Sight, Hearing, Speech Sight Info: Adequate Hearing Info: Adequate Speech Info: Adequate    SPECIAL CARE FACTORS FREQUENCY  PT (By licensed PT), OT (By licensed OT), Speech therapy     PT Frequency: 5/week OT Frequency: 5/week     Speech Therapy Frequency: 5/week      Contractures Contractures Info: Not present    Additional Factors Info  Code Status, Allergies, Psychotropic, Insulin Sliding Scale Code Status Info: Full Code Allergies Info: NKDA Psychotropic Info: Ritalin Insulin Sliding Scale Info: 6/day       Current Medications (10/28/2016):  This is the current hospital active medication list Current Facility-Administered Medications  Medication Dose Route Frequency Provider Last Rate Last Dose  . 0.9 %  sodium chloride infusion   Intravenous Continuous Lucious Groves, DO 75 mL/hr at 10/28/16 0340    . acetaminophen (TYLENOL) tablet 650 mg  650 mg Oral Q6H PRN Collier Salina, MD   650 mg at 10/25/16 0246   Or  . acetaminophen (TYLENOL) suppository 650 mg  650 mg Rectal Q6H PRN Collier Salina, MD   650 mg at 10/27/16 0518  . aspirin tablet 325 mg  325 mg Per NG tube Daily Minus Liberty, MD   325 mg at 10/28/16 1313  . atorvastatin (LIPITOR) tablet 80 mg  80 mg Per NG tube q1800 Minus Liberty, MD   80 mg at 10/27/16 1734  . chlorhexidine (PERIDEX) 0.12 % solution 15 mL  15 mL Mouth Rinse BID Axel Filler,  MD   15 mL at 10/27/16 2109  . enoxaparin (LOVENOX) injection 40 mg  40 mg Subcutaneous Q24H Tasrif Ahmed, MD   40 mg at 10/28/16 1311  . feeding supplement (GLUCERNA 1.2 CAL) liquid 1,000 mL  1,000 mL Per Tube Continuous Garvin Fila, MD 70 mL/hr at 10/28/16 0340 1,000 mL at 10/28/16 0340  . insulin aspart (novoLOG) injection 0-15 Units  0-15 Units Subcutaneous Q4H Shela Leff, MD   5 Units at 10/28/16 1045  . insulin aspart (novoLOG) injection 4 Units  4 Units Subcutaneous TID WC Shela Leff, MD   4 Units at 10/28/16 1044  . insulin glargine (LANTUS) injection 25 Units  25 Units Subcutaneous Daily Minus Liberty, MD   25 Units at 10/28/16 1313  . lactulose (CHRONULAC) 10 GM/15ML solution 20 g  20 g Oral BID Tasrif Ahmed, MD   20 g at 10/28/16 1313  . MEDLINE mouth rinse  15 mL Mouth Rinse q12n4p Axel Filler, MD   15 mL at 10/26/16 1636  . methylphenidate (RITALIN) tablet 10 mg  10 mg Oral BID Garvin Fila, MD   10 mg at 10/28/16 1312  . pantoprazole sodium (PROTONIX) 40 mg/20 mL oral suspension 40 mg  40 mg Per Tube Daily Axel Filler, MD   40 mg at 10/27/16 0911  . piperacillin-tazobactam (ZOSYN) IVPB 3.375 g  3.375 g Intravenous Q8H Catha Gosselin, MD      . thiamine (VITAMIN B-1) tablet 100 mg  100 mg Oral Daily Daleen Bo, MD   100 mg at 10/28/16 1312  . valproate (DEPACON) 500 mg in dextrose 5 % 50 mL IVPB  500 mg Intravenous Q8H Ophelia Shoulder, MD   500 mg at 10/28/16 0002     Discharge Medications: Please see discharge summary for a list of discharge medications.  Relevant Imaging Results:  Relevant Lab Results:   Additional Information SSN: [id number]  Rigoberto Noel, LCSW

## 2016-10-28 NOTE — Clinical Social Work Placement (Signed)
   CLINICAL SOCIAL WORK PLACEMENT  NOTE  Date:  10/28/2016  Patient Details  Name: Scott Harris MRN: [id number] Date of Birth: October 23, 1966  Clinical Social Work is seeking post-discharge placement for this patient at the [place] level of care (*CSW will initial, date and re-position this form in  chart as items are completed):  Yes   Patient/family provided with [facility] Work Department's list of facilities offering this level of care within the geographic area requested by the patient (or if unable, by the patient's family).  Yes   Patient/family informed of their freedom to choose among providers that offer the needed level of care, that participate in Medicare, Medicaid or managed care program needed by the patient, have an available bed and are willing to accept the patient.  Yes   Patient/family informed of [facility]'s ownership interest in [facility] and [facility], as well as of the fact that they are under no obligation to receive care at these facilities.  PASRR submitted to EDS on 10/28/16 (Patient will need 30 day and additional documentation due to mental health and substance abuse history.)     PASRR number received on       Existing PASRR number confirmed on       FL2 transmitted to all facilities in geographic area requested by pt/family on 10/28/16     FL2 transmitted to all facilities within larger geographic area on       Patient informed that his/her managed care company has contracts with or will negotiate with certain facilities, including the following:            Patient/family informed of bed offers received.  Patient chooses bed at       Physician recommends and patient chooses bed at      Patient to be transferred to   on  .  Patient to be transferred to facility by       Patient family notified on   of transfer.  Name of family member notified:        PHYSICIAN Please prepare priority discharge  summary, including medications, Please prepare prescriptions, Please sign FL2     Additional Comment:    _______________________________________________ Rigoberto Noel, LCSW 10/28/2016, 2:32 PM

## 2016-10-28 NOTE — Progress Notes (Signed)
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 Patient was not considered for IV t-PA due to late presentation and stroke not initially suspected.   SUBJECTIVE (INTERVAL HISTORY) His wife is at the bedside. I spoke with her at length.  He continues to be lethargic. He did not partially open eyes or show the more purposeful movements noted in the neurologic exam yesterday. As a result, a STAT CT of the head was ordered.  Scan showed: Expected evolution of left ACA and right MCA territory infarcts without evidence of hemorrhagic transformation, new infarct or other new acute intracranial abnormality.   OBJECTIVE Temp:  [97.7 F (36.5 C)-100.3 F (37.9 C)] 99.1 F (37.3 C) (12/02 1033) Pulse Rate:  [106-122] 110 (12/02 1033) Cardiac Rhythm: Sinus tachycardia (12/02 0707) Resp:  [19-27] 22 (12/02 1033) BP: (163-219)/(89-118) 179/103 (12/02 1033) SpO2:  [92 %-95 %] 94 % (12/02 1033) Weight:  [85.2 kg (187 lb 12.8 oz)] 85.2 kg (187 lb 12.8 oz) (12/02 0858)  CBC:   Recent Labs Lab 10/27/16 0500 10/28/16 0503  WBC 14.7* 15.0*  HGB 15.9 16.2  HCT 45.2 45.9  MCV 88.8 87.9  PLT 248 [id number]    Basic Metabolic Panel:   Recent Labs Lab 10/22/16 0404 10/25/16 0350 10/27/16 0500  NA 137 139 135  K 4.4 3.9 4.3  CL 101 104 102  CO2 24 23 21*  GLUCOSE 222* 212* 264*  BUN 15 14 15   CREATININE 0.81 0.71 0.70  CALCIUM 9.9 9.8 9.7  MG 2.0  --   --     Lipid Panel:     Component Value Date/Time   CHOL 172 10/22/2016 1425   TRIG 157 (H) 10/22/2016 1425   HDL 51 10/22/2016  1425   CHOLHDL 3.4 10/22/2016 1425   VLDL 31 10/22/2016 1425   LDLCALC 90 10/22/2016 1425   HgbA1c:  Lab Results  Component Value Date   HGBA1C 8.5 (H) 10/22/2016   Urine Drug Screen:     Component Value Date/Time   LABOPIA NONE DETECTED 10/20/2016 1437   COCAINSCRNUR NONE DETECTED 10/20/2016 1437   LABBENZ NONE DETECTED 10/20/2016 1437   AMPHETMU NONE DETECTED 10/20/2016 [place] DETECTED 10/20/2016 1437   LABBARB NONE DETECTED 10/20/2016 1437      IMAGING  CT Head Wo Contrast 10/25/2016 Low-density in the known infarctions affecting the right frontal deep white matter in the left frontal cortical and subcortical brain. No sign of hemorrhagic transformation or significant mass effect/shift. The seems to be a little more involvement in the left basal ganglia by today's CT.  CTA Head and Neck 10/22/2016 1. Functional occlusion of both ICA siphons with suboptimal reconstitution of flow at both ICA termini: Radiographic string sign stenosis at the right ICA origin in the neck related to bulky low-density plaque or thrombus.  The cervical Right ICA demonstrates thread-like enhancement and appears to occlude at the skullbase.  Suspected much of the Right ICA siphon is fully occluded. Tapered appearance of the cervical Left ICA without focal stenosis in the neck. There is thread-like enhancement in the Left ICA siphon, but suspected the supraclinoid Left ICA segment  is fully occluded.  Thus the tapering in the neck is likely do to distal outflow obstruction. Reconstituted flow from the posterior communicating arteries, which unfortunately are diminutive. No ACA or MCA occlusion identified, but the anterior circulation appears diminutive throughout. 2. Preliminary report of the above was discussed with Dr. Elpidio Anis at 1138 hours. 3. Posterior circulation is largely normal. There does appear to be a moderate or severe atherosclerotic stenosis at the right vertebral artery  origin, but no other posterior circulation stenosis. 4. Expected CT appearance of evolving bilateral anterior circulation infarcts. No associated hemorrhage or mass effect. 5. No other abnormality in the neck. Mild emphysema in the lung apices.   Cerebral Angiogram 10/23/2016 Angiographically occluded internal carotid arteries in the cavernous segments at the level of the ophthalmic arteries bilaterally, without reconstitution from the external carotid artery branches.  Pre occlusive string sign of the right internal carotid artery proximally extending to the occlusion in the cavernous segment as described above.  Approximally 70% stenoses of the left internal carotid artery at the bulb again with a diminutive left internal carotid artery distal to this to its occluded cavernous segment. Approximately 85-90% stenoses at the origin of the right vertebral artery.  Retrograde opacification of the anterior cerebral artery distribution bilaterally from the P3 leptomeningeal branches, and from the thalamic perforators from the P2 segment of the posterior cerebral arteries. Occluded pericallosal and callosal marginal branches of the anterior cerebral arteries at the level of the head of the corpus callosum.    CT Head Wo Contrast 10/28/2016 Expected evolution of left ACA and right MCA territory infarcts without evidence of hemorrhagic transformation, new infarct or other new acute intracranial abnormality.  EEG   Diffuse slow activity, max over left temporal regions some improvement over the course of the recording.   PHYSICAL EXAM Middle-aged Caucasian male who is not in distress. Low grade fever.   HEENT:  Head is nontraumatic. PERRL; Neck is supple without bruit.     Cardiac exam no murmur or gallop. Lungs are clear to auscultation.  Abdomen ND normal bowel sounds Extremities:  No C/C/E.  Distal pulses are well felt.  Neurological Exam : Patient is stuporous. In response to sternal  rub minimal response Eyes are closed. Follows no commands.  He moans and groans but doesn't does not have any spontaneous speech and does not follow commands.  Pupils are equal 4 mm reactive. Vision acuity and fields cannot be tested reliably. OCRs limited.  Patient noted to have spontaneous cough.  Patient minimally moves right side purposefully. He has mild left-sided weakness.    Deep tendon reflexes are 2+ symmetric.  Plantars have an ongoing response  Coordination and Gait:  Can not be tested at this time  ASSESSMENT/PLAN [name] is a 50 y.o. male with history of  newly diagnosed bilateral strokes, DM2, ETOH abuse HLD and HTN  presenting with confusion, L sided numbness and tingling x 2 weeks, neuro consulted for possible seizure.   Stroke:  Progressive bilateral anterior circulation infarcts in setting of severe bilateral ICA disease - Has uncontrolled DM II  R anterior circulation white matter  subacute infarct  1-2 weeks ago New L MCA/ACA watershed infarct   Resultant  R sided weakness, lethargy, not following commands, dysphagia.   MRI head  Progressive B anterior circulation infarcts, L MCA/ACA infarct.  MRI CS mild to moderate degenerative cervical foraminal stenosis  CTA head and neck  Functional occlusion B ICAs with reconstitution.  2D Echo -  EF 65 - 70%. No cardiac source of emboli identified.  LDL 90  HgbA1c 8.5  SCDs for VTE prophylaxis Diet NPO time specified  No antithrombotic prior to admission, now on aspirin 325 mg daily and clopidogrel 75 mg daily  Ongoing aggressive stroke risk factor management  Taper steroids underway  Therapy recommendations: Possible CIR  Disposition:  pending (lives w/ wife, works as a Patent examiner)  AMS Unclear etiology. Could be from the stroke. Less likely seizure. Checked Ammonia level as patient received few doses of valproic acid along and keppra prevoiusly. It's mildly elevated. Does not seem to have  liver abnormalities although he does have heavy drinking hx.  - Continue trial of lactulose  Continue Ritalin 10mg  bid 6am and 10 am.   On valproic acid, level was therapeutic. Re-checking ammonia level.  Will consider tapering off.  Carotid Disease, bilateral aniogram done by Dr. Estanislado Pandy on 11/27 1. 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 3.Approx 80 to 85 % stenosis of RT VA at origin. 4.Bilaterally occluded pericallosal and callosal margin arteries with post ant reconstitution from P3 leptomeningeal collateralls and post thalamo perforaters  Unusual presentation  Likely atherosclerosis with his uncontrolled DM II  Plan for vertebral stenting in 2 weeks  Hypertension       Permissive hypertension (OK if < 220/120)   Long-term BP goal normotensive  Hyperlipidemia  Home meds:  lipitor 10  Lipitor increased to 80 in hospital  LDL 90, goal < 70  Continue statin at discharge  Diabetes  HgbA1c 8.5.  Treatment per primary team.   Alcohol Abuse  Heavy drinker per wife  Recent fall on 11/9 after birthday party  [place] again last week  CIWA protocol. Would highly encourage avoiding sedatives as much as possible.   Dysphagia  Secondary to stroke  Placed NG for aspriin and plavix  Radiology consulted for PEG  Other Stroke Risk Factors  Cigarette smoker, advised to stop smoking  Overweight, Body mass index is 28.55 kg/m., recommend weight loss, diet and exercise as appropriate  Obstructive sleep apnea, inconsistent use of CPAP at home. Recommend C PAP while hospitalized.  Elevated ammonia - on lactulose - consider recheck  Leukocytosis - 15 - Temp 100 axillary (no abxs currently). CXR - pending. U/A - pending; added sepsis evaluation order set.  Started Zosyn empirically  PEG scheduled for next Wed. (IR wants to hold plavix until after procedure - apparently OK'd by Dr Estanislado Pandy)   Neuro  concerned about severity of cerebrovascular disease. Spoke with Radiology PA about possibly doing PEG on ASA and Plavix. She would need to clear with MD doing procedure. Suggested Integrelin bridge instead.  Head CT repeated today -> expected evolution of left ACA and right MCA territory infarcts  Hospital day # 7  I have personally examined this patient, reviewed notes, independently viewed imaging studies, participated in medical decision making and plan of care.ROS completed by me personally and pertinent positives fully documented  I have made any additions or clarifications directly to the above note. Agree with note above.  Discussed with wife and father at the bedside and answered questions. This patient is neurologically ill and at risk of neurological worsening, death given severe vascular diease.  I have made any additions or clarifications directly to the above note.This clinical care time does not reflect procedure time, or teaching time or supervisory time of PA/NP/Med Resident etc but could involve care discussion time.  I spent 60 minutes of clinical care time  in the care of  this patient.

## 2016-10-29 LAB — CBC
HEMATOCRIT: 46.8 % (ref 39.0–52.0)
Hemoglobin: 16.4 g/dL (ref 13.0–17.0)
MCH: 31.2 pg (ref 26.0–34.0)
MCHC: 35 g/dL (ref 30.0–36.0)
MCV: 89.1 fL (ref 78.0–100.0)
Platelets: 298 10*3/uL (ref 150–400)
RBC: 5.25 MIL/uL (ref 4.22–5.81)
RDW: 12.4 % (ref 11.5–15.5)
WBC: 14.8 10*3/uL — ABNORMAL HIGH (ref 4.0–10.5)

## 2016-10-29 LAB — BASIC METABOLIC PANEL
Anion gap: 11 (ref 5–15)
BUN: 19 mg/dL (ref 6–20)
CALCIUM: 9.7 mg/dL (ref 8.9–10.3)
CO2: 24 mmol/L (ref 22–32)
CREATININE: 0.84 mg/dL (ref 0.61–1.24)
Chloride: 105 mmol/L (ref 101–111)
GFR calc Af Amer: >60 mL/min (ref >60)
GLUCOSE: 277 mg/dL — AB (ref 65–99)
POTASSIUM: 4.2 mmol/L (ref 3.5–5.1)
SODIUM: 140 mmol/L (ref 135–145)

## 2016-10-29 LAB — GLUCOSE, CAPILLARY
GLUCOSE-CAPILLARY: 219 mg/dL — AB (ref 65–99)
GLUCOSE-CAPILLARY: 238 mg/dL — AB (ref 65–99)
GLUCOSE-CAPILLARY: 251 mg/dL — AB (ref 65–99)
GLUCOSE-CAPILLARY: 275 mg/dL — AB (ref 65–99)
Glucose-Capillary: 307 mg/dL — ABNORMAL HIGH (ref 65–99)
Glucose-Capillary: 303 mg/dL — ABNORMAL HIGH (ref 65–99)

## 2016-10-29 MED ORDER — MODAFINIL 100 MG PO TABS
100.0000 mg | ORAL_TABLET | Freq: Two times a day (BID) | ORAL | Status: DC
  Administered 2016-10-29 – 2016-11-05 (×14): 100 mg
  Filled 2016-10-29 (×13): qty 1

## 2016-10-29 MED ORDER — LEVETIRACETAM 100 MG/ML PO SOLN
500.0000 mg | Freq: Two times a day (BID) | ORAL | Status: DC
  Administered 2016-10-29 – 2016-11-05 (×15): 500 mg
  Filled 2016-10-29 (×16): qty 5

## 2016-10-29 MED ORDER — INSULIN GLARGINE 100 UNIT/ML ~~LOC~~ SOLN
30.0000 [IU] | Freq: Every day | SUBCUTANEOUS | Status: DC
  Filled 2016-10-29: qty 0.3

## 2016-10-29 MED ORDER — FLUTICASONE PROPIONATE 50 MCG/ACT NA SUSP
2.0000 | Freq: Every day | NASAL | Status: DC
  Administered 2016-10-29: 2 via NASAL
  Filled 2016-10-29: qty 16

## 2016-10-29 NOTE — Progress Notes (Signed)
Subjective: No acute events overnight. Patient more somnolent today.  Objective:  Vital signs in last 24 hours: Vitals:   10/28/16 1033 10/28/16 1338 10/28/16 2129 10/29/16 0613  BP: (!) 179/103 (!) 156/93 (!) 186/100 (!) 168/90  Pulse: (!) 110 (!) 117 (!) 125 (!) 112  Resp: (!) 22 (!) 24  18  Temp: 99.1 F (37.3 C) 100 F (37.8 C) 98.9 F (37.2 C) 97.4 F (36.3 C)  TempSrc: Axillary Oral Oral Oral  SpO2: 94% 96% 98% 94%  Weight:    181 lb 1.6 oz (82.1 kg)  Height:       Physical Exam  Constitutional: He appears well-developed and well-nourished.  HENT:  Head: Normocephalic and atraumatic.  Cardiovascular: Normal rate and regular rhythm.   Respiratory: Effort normal and breath sounds normal. No respiratory distress. He has no wheezes.  GI: Soft. Bowel sounds are normal. He exhibits no distension. There is no tenderness.  Musculoskeletal: He exhibits no edema.  Neurological:  Less interactive than prior. Does not open his eyes. Does withdrawal to pain. Did not open eyes on sternal rub. Some spontaneous movement of the right lower extremity.     Assessment/Plan:  Principal Problem:   CVA (cerebral vascular accident) ([place]) Active Problems:   Essential hypertension with goal blood pressure less than 130/80   Type 2 diabetes mellitus with complication (HCC)   Altered mental state   Alcoholism ([place])   Seizures (HCC)   Cerebral thrombosis with cerebral infarction  1. Bilateral frontal stroke and ICA occlusion Repeat CT head from yesterday showed progression of known strokes without evidence of hemorrhagic conversion. -- Stroke team following -- Aspirin 325 mg once daily -- Clopidogrel, held by interventional radiology request for a minimum of 5 days preceding percutaneous endoscopic gastrostomy tube placement -- Atorvastatin 80 mg daily -- Per stroke team will discontinue valproic acid as it has been associated with hyperammonemia, for seizure prophylaxis we will start  Keppra 500 mg twice a day -- Per stroke team will discontinue methylphenidate and start Provigil at 100 mg -- Follow-up physical therapy and physical medicine and rehabilitation recommendations   2. Hyperammonemia Patient has mildly elevated ammonia with a concern for hepatic encephalopathy contributing to his lethargy. Another potential etiology for the patient's hyperammonemia may be secondary to valproate use. At this time we will discontinue the patient's valproic acid start Keppra for seizure prophylaxis. Hopefully, the patient's ammonia will return to normal limits and we can effectively rule out hyperammonemia as a cause of his lethargy. -- Continue lactulose twice a day -- Discontinue valproic acid  3. HTN Allowing permissive hypertension in the setting of an acute ischemic infarct with a goal systolic of 0000000. -- Hold lisinopril  4. Diabetes Hemoglobin A1c of 8.5%. The patient was taking sitagliptin-metformin once daily at home. -- Lantus 30 units daily -- NovoLog 4 units 3 times a day -- Sliding scale insulin  5. Leukocytosis The patient has had a leukocytosis since 10/25/2016. His most recent white cell count was 14.8. His maximum white blood cell count since admission has been 15. Yesterday, the patient became more somnolent and an infectious workup was begun. His chest x-ray did not show any acute cardiopulmonary abnormality or evidence of pneumonia. His urinalysis was without evidence of infection. Blood cultures were drawn which show no growth currently. The patient is afebrile. He was started on Zosyn. However, given that the patient is afebrile without evidence of pneumonia on chest imaging or urinary tract infection I will discontinue  antibiotics at this time and we will continue to monitor. -- Discontinue Zosyn -- Continue to monitor  DVT/PE prophylaxis: Lovenox FEN/GI: Nutrition per tub  Dispo: Anticipated discharge following G-tube placement and pending  long-term care facility.   Scott Shoulder, MD 10/29/2016, 9:38 AM Pager: [phone]

## 2016-10-29 NOTE — Progress Notes (Signed)
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 Patient was not considered for IV t-PA due to late presentation and stroke not initially suspected.   SUBJECTIVE (INTERVAL HISTORY) His wife is at the bedside. I spoke with her at length.  He continues to be lethargic. He did not partially open eyes or show the more purposeful movements noted in the neurologic exam yesterday.   Wife reports that patient may still have contacts in his eys.  I alerted the RN.    OBJECTIVE Temp:  [97.4 F (36.3 C)-100 F (37.8 C)] 97.4 F (36.3 C) (12/03 0613) Pulse Rate:  [110-125] 112 (12/03 0613) Cardiac Rhythm: Sinus tachycardia (12/03 0700) Resp:  [18-24] 18 (12/03 0613) BP: (156-186)/(90-103) 168/90 (12/03 0613) SpO2:  [94 %-98 %] 94 % (12/03 0613) Weight:  [82.1 kg (181 lb 1.6 oz)-85.2 kg (187 lb 12.8 oz)] 82.1 kg (181 lb 1.6 oz) (12/03 0613)  CBC:   Recent Labs Lab 10/28/16 1440 10/29/16 0518  WBC 11.6* 14.8*  NEUTROABS 8.4*  --   HGB 16.3 16.4  HCT 45.4 46.8  MCV 88.3 89.1  PLT 251 [id number]    Basic Metabolic Panel:   Recent Labs Lab 10/27/16 0500 10/28/16 1440  NA 135 136  K 4.3 4.2  CL 102 103  CO2 21* 24  GLUCOSE 264* 275*  BUN 15 16  CREATININE 0.70 0.69  CALCIUM 9.7 9.7    Lipid Panel:     Component Value Date/Time   CHOL 172 10/22/2016 1425   TRIG 157 (H) 10/22/2016 1425   HDL 51 10/22/2016 1425   CHOLHDL 3.4 10/22/2016 1425   VLDL 31 10/22/2016 1425   LDLCALC 90 10/22/2016 1425   HgbA1c:  Lab Results  Component Value Date   HGBA1C 8.5 (H) 10/22/2016    Urine Drug Screen:     Component Value Date/Time   LABOPIA NONE DETECTED 10/20/2016 1437   COCAINSCRNUR NONE DETECTED 10/20/2016 1437   LABBENZ NONE DETECTED 10/20/2016 1437   AMPHETMU NONE DETECTED 10/20/2016 [place] DETECTED 10/20/2016 1437   LABBARB NONE DETECTED 10/20/2016 1437      IMAGING  CT Head Wo Contrast 10/25/2016 Low-density in the known infarctions affecting the right frontal deep white matter in the left frontal cortical and subcortical brain. No sign of hemorrhagic transformation or significant mass effect/shift. The seems to be a little more involvement in the left basal ganglia by today's CT.  CTA Head and Neck 10/22/2016 1. Functional occlusion of both ICA siphons with suboptimal reconstitution of flow at both ICA termini: Radiographic string sign stenosis at the right ICA origin in the neck related to bulky low-density plaque or thrombus.  The cervical Right ICA demonstrates thread-like enhancement and appears to occlude at the skullbase.  Suspected much of the Right ICA siphon is fully occluded. Tapered appearance of the cervical Left ICA without focal stenosis in the neck. There is thread-like enhancement in the Left ICA siphon, but suspected the supraclinoid Left ICA segment is fully occluded.  Thus the tapering in the neck is likely do to distal outflow obstruction. Reconstituted flow from the posterior communicating arteries, which unfortunately  are diminutive. No ACA or MCA occlusion identified, but the anterior circulation appears diminutive throughout. 2. Preliminary report of the above was discussed with Dr. Elpidio Anis at 1138 hours. 3. Posterior circulation is largely normal. There does appear to be a moderate or severe atherosclerotic stenosis at the right vertebral artery origin, but no other posterior circulation stenosis. 4. Expected CT appearance of evolving bilateral anterior circulation infarcts. No associated hemorrhage or mass  effect. 5. No other abnormality in the neck. Mild emphysema in the lung apices.   Cerebral Angiogram 10/23/2016 Angiographically occluded internal carotid arteries in the cavernous segments at the level of the ophthalmic arteries bilaterally, without reconstitution from the external carotid artery branches.  Pre occlusive string sign of the right internal carotid artery proximally extending to the occlusion in the cavernous segment as described above.  Approximally 70% stenoses of the left internal carotid artery at the bulb again with a diminutive left internal carotid artery distal to this to its occluded cavernous segment. Approximately 85-90% stenoses at the origin of the right vertebral artery.  Retrograde opacification of the anterior cerebral artery distribution bilaterally from the P3 leptomeningeal branches, and from the thalamic perforators from the P2 segment of the posterior cerebral arteries. Occluded pericallosal and callosal marginal branches of the anterior cerebral arteries at the level of the head of the corpus callosum.    CT Head Wo Contrast 10/28/2016 Expected evolution of left ACA and right MCA territory infarcts without evidence of hemorrhagic transformation, new infarct or other new acute intracranial abnormality.  Portable CXR 1 View 10/28/2016 Low lung volumes without radiographic evidence of acute cardiopulmonary disease.  EEG   Diffuse slow activity, max over left temporal regions some improvement over the course of the recording.   PHYSICAL EXAM Middle-aged Caucasian male who is not in distress. Low grade fever.   HEENT:  Head is nontraumatic. PERRL; Neck is supple without bruit.     Cardiac exam no murmur or gallop. Lungs are clear to auscultation.  Abdomen ND normal bowel sounds Extremities:  No C/C/E.  Distal pulses are well felt.  Neurological Exam : Patient is stuporous. In response to sternal rub minimal response Eyes are closed. Follows no  commands.  He moans and groans but doesn't does not have any spontaneous speech and does not follow commands.  Pupils are equal 4 mm reactive. Vision acuity and fields cannot be tested reliably. OCRs limited.  Patient noted to have spontaneous cough.  Patient minimally moves right side purposefully. He has mild left-sided weakness.    Deep tendon reflexes are 2+ symmetric.  Plantars have an ongoing response  Coordination and Gait:  Can not be tested at this time  ASSESSMENT/PLAN Mr. Vontavious Kroner is a 50 y.o. male with history of  newly diagnosed bilateral strokes, DM2, ETOH abuse HLD and HTN  presenting with confusion, L sided numbness and tingling x 2 weeks, neuro consulted for possible seizure.   Stroke:  Progressive bilateral anterior circulation infarcts in setting of severe bilateral ICA disease - Has uncontrolled DM II  R anterior circulation white matter  subacute infarct  1-2 weeks ago New L MCA/ACA watershed infarct   Resultant  R sided weakness, lethargy, not following commands, dysphagia.   MRI head  Progressive B anterior circulation infarcts, L MCA/ACA infarct.  MRI CS mild to moderate degenerative cervical foraminal stenosis  CTA head and neck  Functional occlusion B ICAs with reconstitution.  2D Echo - EF 65 - 70%. No cardiac source of emboli  identified.  LDL 90  HgbA1c 8.5  SCDs for VTE prophylaxis Diet NPO time specified  No antithrombotic prior to admission, now on aspirin 325 mg daily and clopidogrel 75 mg daily  Ongoing aggressive stroke risk factor management  Taper steroids underway  Therapy recommendations: Possible CIR  Disposition:  pending (lives w/ wife, works as a Patent examiner)  AMS Unclear etiology. Could be from the stroke. Less likely seizure. Checked Ammonia level as patient received few doses of valproic acid along and keppra prevoiusly. It's mildly elevated. Does not seem to have liver abnormalities although he does have heavy  drinking hx.  - Continue trial of lactulose  Continue Ritalin 10mg  bid 6am and 10 am.   On valproic acid, level was therapeutic. Re-checking ammonia level.  Will consider tapering off.  Carotid Disease, bilateral aniogram done by Dr. Estanislado Pandy on 11/27 1. 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 3.Approx 80 to 85 % stenosis of RT VA at origin. 4.Bilaterally occluded pericallosal and callosal margin arteries with post ant reconstitution from P3 leptomeningeal collateralls and post thalamo perforaters  Unusual presentation  Likely atherosclerosis with his uncontrolled DM II  Plan for vertebral stenting in 2 weeks  Hypertension       Permissive hypertension (OK if < 220/120)   Long-term BP goal normotensive  Hyperlipidemia  Home meds:  lipitor 10  Lipitor increased to 80 in hospital  LDL 90, goal < 70  Continue statin at discharge  Diabetes  HgbA1c 8.5.  Treatment per primary team.   Alcohol Abuse  Heavy drinker per wife  Recent fall on 11/9 after birthday party  [place] again last week  CIWA protocol. Would highly encourage avoiding sedatives as much as possible.   Dysphagia  Secondary to stroke  Placed NG for aspriin and plavix  Radiology consulted for PEG  Other Stroke Risk Factors  Cigarette smoker, advised to stop smoking  Overweight, Body mass index is 27.54 kg/m., recommend weight loss, diet and exercise as appropriate  Obstructive sleep apnea, inconsistent use of CPAP at home. Recommend C PAP while hospitalized.   Other Problems  Elevated ammonia - on lactulose - 57 -> 74 (Depacon changed to Keppra)  Leukocytosis - 14.8 - Temp 100 yesterday. 97.4 today. (CXR - low lung volumes) (U/A - rare bacteria) ; added sepsis evaluation order set.  Started Zosyn empirically  PEG scheduled for next Wed. (Plavix held - apparently OK'd by Dr Estanislado Pandy - continue ASA)   Neuro concerned about  severity of cerebrovascular disease. Spoke with Radiology PA about possibly doing PEG on ASA and Plavix. She would need to clear with MD doing procedure. Suggested Integrelin bridge instead. Spoke with pharmacist. She did not feel bridging with IV Integrelin or IV Heparin would be appropriate in this case.  Head CT repeated Saturday -> expected evolution of left ACA and right MCA territory infarcts  Hospital day # 8  ATTENDING NOTE: Patient was seen and examined by me personally. Documentation reflects findings. The laboratory and radiographic studies reviewed by me. ROS pertinent positives could not be fully documented due to LOC  Condition: unchanged  Assessment and plan completed by me personally and fully documented above. Plans/Recommendations include:     Ammonia level elevated.  Spoke with primary team about Depakote.  Depakote should be stopped and Keppra 500mg  BID started  Recommend Provigil trial for improvement of LOC and forincreased awareness.  Ritalin can be weaned to off since  did not seem to help   Discussed the need to proceed with ASA only.  Described the risks and benefits to wife who stated understanding that the options are limited and PEG is the direction she wants to go for now.  I think heparin gtt or integrelin could create increased risk hemorrhagic trasformation  RN to remove contacts and wife to contact eye clinic for glasses as alternative  Primary team managing glucose.  Recommend aggressive management in the setting of acute stroke  Patient could benefit from his home CPAP machine.  Recommend asking wife to bring it in for use at least at night  Leukocytosis of unknown etiology.  Patient becoming more tachycardiac.  Primary team discontinued Zosyn and patient under evaluation for cause of leukocytosis.  Thus far culture are not revealing  SIGNED BY: Dr. Elissa Hefty

## 2016-10-30 ENCOUNTER — Inpatient Hospital Stay (HOSPITAL_COMMUNITY): Payer: 59

## 2016-10-30 DIAGNOSIS — E7220 Disorder of urea cycle metabolism, unspecified: Secondary | ICD-10-CM

## 2016-10-30 DIAGNOSIS — Z794 Long term (current) use of insulin: Secondary | ICD-10-CM

## 2016-10-30 DIAGNOSIS — I6300 Cerebral infarction due to thrombosis of unspecified precerebral artery: Secondary | ICD-10-CM

## 2016-10-30 DIAGNOSIS — R569 Unspecified convulsions: Secondary | ICD-10-CM

## 2016-10-30 DIAGNOSIS — E118 Type 2 diabetes mellitus with unspecified complications: Secondary | ICD-10-CM

## 2016-10-30 DIAGNOSIS — I63233 Cerebral infarction due to unspecified occlusion or stenosis of bilateral carotid arteries: Principal | ICD-10-CM

## 2016-10-30 DIAGNOSIS — J9601 Acute respiratory failure with hypoxia: Secondary | ICD-10-CM

## 2016-10-30 DIAGNOSIS — F1020 Alcohol dependence, uncomplicated: Secondary | ICD-10-CM

## 2016-10-30 LAB — BLOOD CULTURE ID PANEL (REFLEXED)
Acinetobacter baumannii: NOT DETECTED
CANDIDA ALBICANS: NOT DETECTED
CANDIDA TROPICALIS: NOT DETECTED
Candida glabrata: NOT DETECTED
Candida krusei: NOT DETECTED
Candida parapsilosis: NOT DETECTED
ENTEROBACTERIACEAE SPECIES: NOT DETECTED
ENTEROCOCCUS SPECIES: NOT DETECTED
Enterobacter cloacae complex: NOT DETECTED
Escherichia coli: NOT DETECTED
HAEMOPHILUS INFLUENZAE: NOT DETECTED
KLEBSIELLA PNEUMONIAE: NOT DETECTED
Klebsiella oxytoca: NOT DETECTED
LISTERIA MONOCYTOGENES: NOT DETECTED
METHICILLIN RESISTANCE: NOT DETECTED
NEISSERIA MENINGITIDIS: NOT DETECTED
Proteus species: NOT DETECTED
Pseudomonas aeruginosa: NOT DETECTED
STAPHYLOCOCCUS SPECIES: DETECTED — AB
STREPTOCOCCUS PYOGENES: NOT DETECTED
STREPTOCOCCUS SPECIES: NOT DETECTED
Serratia marcescens: NOT DETECTED
Staphylococcus aureus (BCID): NOT DETECTED
Streptococcus agalactiae: NOT DETECTED
Streptococcus pneumoniae: NOT DETECTED

## 2016-10-30 LAB — BLOOD GAS, ARTERIAL
ACID-BASE EXCESS: 3.1 mmol/L — AB (ref 0.0–2.0)
Acid-Base Excess: 1.5 mmol/L (ref 0.0–2.0)
BICARBONATE: 26.5 mmol/L (ref 20.0–28.0)
Bicarbonate: 26.7 mmol/L (ref 20.0–28.0)
DRAWN BY: 252031
Drawn by: 406621
FIO2: 100
LHR: 16 {breaths}/min
O2 CONTENT: 2 L/min
O2 SAT: 92.7 %
O2 Saturation: 98.5 %
PATIENT TEMPERATURE: 98.6
PEEP: 10 cmH2O
Patient temperature: 102.6
VT: 570 mL
pCO2 arterial: 37.8 mmHg (ref 32.0–48.0)
pCO2 arterial: 54.7 mmHg — ABNORMAL HIGH (ref 32.0–48.0)
pH, Arterial: 7.463 — ABNORMAL HIGH (ref 7.350–7.450)
pH, Arterial: 7.319 — ABNORMAL LOW (ref 7.350–7.450)
pO2, Arterial: 66.8 mmHg — ABNORMAL LOW (ref 83.0–108.0)
pO2, Arterial: 171 mmHg — ABNORMAL HIGH (ref 83.0–108.0)

## 2016-10-30 LAB — GLUCOSE, CAPILLARY
GLUCOSE-CAPILLARY: 142 mg/dL — AB (ref 65–99)
GLUCOSE-CAPILLARY: 152 mg/dL — AB (ref 65–99)
GLUCOSE-CAPILLARY: 183 mg/dL — AB (ref 65–99)
GLUCOSE-CAPILLARY: 220 mg/dL — AB (ref 65–99)
GLUCOSE-CAPILLARY: 286 mg/dL — AB (ref 65–99)
Glucose-Capillary: 267 mg/dL — ABNORMAL HIGH (ref 65–99)
Glucose-Capillary: 242 mg/dL — ABNORMAL HIGH (ref 65–99)
Glucose-Capillary: 282 mg/dL — ABNORMAL HIGH (ref 65–99)
Glucose-Capillary: 243 mg/dL — ABNORMAL HIGH (ref 65–99)
Glucose-Capillary: 161 mg/dL — ABNORMAL HIGH (ref 65–99)
Glucose-Capillary: 148 mg/dL — ABNORMAL HIGH (ref 65–99)
Glucose-Capillary: 148 mg/dL — ABNORMAL HIGH (ref 65–99)

## 2016-10-30 LAB — COMPREHENSIVE METABOLIC PANEL
ALBUMIN: 3 g/dL — AB (ref 3.5–5.0)
ALK PHOS: 71 U/L (ref 38–126)
ALT: 35 U/L (ref 17–63)
AST: 27 U/L (ref 15–41)
Anion gap: 9 (ref 5–15)
BUN: 20 mg/dL (ref 6–20)
CALCIUM: 9.2 mg/dL (ref 8.9–10.3)
CHLORIDE: 107 mmol/L (ref 101–111)
CO2: 23 mmol/L (ref 22–32)
CREATININE: 0.85 mg/dL (ref 0.61–1.24)
GFR calc non Af Amer: >60 mL/min (ref >60)
GLUCOSE: 289 mg/dL — AB (ref 65–99)
Potassium: 4.7 mmol/L (ref 3.5–5.1)
SODIUM: 139 mmol/L (ref 135–145)
Total Bilirubin: 0.8 mg/dL (ref 0.3–1.2)
Total Protein: 6 g/dL — ABNORMAL LOW (ref 6.5–8.1)

## 2016-10-30 LAB — CBC
HCT: 46.7 % (ref 39.0–52.0)
HEMOGLOBIN: 15.8 g/dL (ref 13.0–17.0)
MCH: 30.8 pg (ref 26.0–34.0)
MCHC: 33.8 g/dL (ref 30.0–36.0)
MCV: 91 fL (ref 78.0–100.0)
PLATELETS: 290 10*3/uL (ref 150–400)
RBC: 5.13 MIL/uL (ref 4.22–5.81)
RDW: 12.4 % (ref 11.5–15.5)
WBC: 17.8 10*3/uL — AB (ref 4.0–10.5)

## 2016-10-30 LAB — MRSA PCR SCREENING: MRSA by PCR: NEGATIVE

## 2016-10-30 LAB — AMMONIA: AMMONIA: 50 umol/L — AB (ref 9–35)

## 2016-10-30 LAB — PROCALCITONIN: PROCALCITONIN: 0.24 ng/mL

## 2016-10-30 MED ORDER — ACETAMINOPHEN 650 MG RE SUPP: 650.0000 mg | Freq: Four times a day (QID) | RECTAL | Status: DC | PRN

## 2016-10-30 MED ORDER — HYDRALAZINE HCL 20 MG/ML IJ SOLN
10.0000 mg | Freq: Four times a day (QID) | INTRAMUSCULAR | Status: DC | PRN
  Administered 2016-11-04 – 2016-11-05 (×2): 10 mg via INTRAVENOUS
  Filled 2016-10-30 (×2): qty 1

## 2016-10-30 MED ORDER — MIDAZOLAM HCL 2 MG/2ML IJ SOLN
2.0000 mg | INTRAMUSCULAR | Status: DC | PRN
  Administered 2016-10-30 – 2016-11-05 (×19): 2 mg via INTRAVENOUS
  Filled 2016-10-30 (×13): qty 2

## 2016-10-30 MED ORDER — MIDAZOLAM HCL 2 MG/2ML IJ SOLN
2.0000 mg | INTRAMUSCULAR | Status: DC | PRN
  Administered 2016-10-30: 2 mg via INTRAVENOUS
  Filled 2016-10-30 (×8): qty 2

## 2016-10-30 MED ORDER — SODIUM CHLORIDE 0.9 % IV SOLN
INTRAVENOUS | Status: DC
  Administered 2016-10-30: 1.8 [IU]/h via INTRAVENOUS
  Administered 2016-11-01: 08:00:00 via INTRAVENOUS
  Filled 2016-10-30 (×2): qty 2.5

## 2016-10-30 MED ORDER — ACETAMINOPHEN 325 MG PO TABS
650.0000 mg | ORAL_TABLET | ORAL | Status: DC | PRN
  Administered 2016-10-30 – 2016-11-02 (×5): 650 mg via ORAL
  Filled 2016-10-30 (×5): qty 2

## 2016-10-30 MED ORDER — MIDAZOLAM HCL 2 MG/2ML IJ SOLN
2.0000 mg | Freq: Once | INTRAMUSCULAR | Status: AC
  Administered 2016-10-30: 2 mg via INTRAVENOUS

## 2016-10-30 MED ORDER — SODIUM CHLORIDE 0.9 % IV BOLUS (SEPSIS)
1000.0000 mL | Freq: Once | INTRAVENOUS | Status: AC
  Administered 2016-10-30: 1000 mL via INTRAVENOUS

## 2016-10-30 MED ORDER — FENTANYL CITRATE (PF) 100 MCG/2ML IJ SOLN
50.0000 ug | Freq: Once | INTRAMUSCULAR | Status: AC
  Administered 2016-10-30: 50 ug via INTRAVENOUS

## 2016-10-30 MED ORDER — ORAL CARE MOUTH RINSE
15.0000 mL | OROMUCOSAL | Status: DC
  Administered 2016-10-30 – 2016-11-05 (×63): 15 mL via OROMUCOSAL

## 2016-10-30 MED ORDER — FENTANYL CITRATE (PF) 100 MCG/2ML IJ SOLN
100.0000 ug | INTRAMUSCULAR | Status: DC | PRN
  Administered 2016-10-30 – 2016-11-04 (×2): 100 ug via INTRAVENOUS
  Filled 2016-10-30 (×11): qty 2

## 2016-10-30 MED ORDER — ETOMIDATE 2 MG/ML IV SOLN
20.0000 mg | Freq: Once | INTRAVENOUS | Status: AC
  Administered 2016-10-30: 20 mg via INTRAVENOUS

## 2016-10-30 MED ORDER — CHLORHEXIDINE GLUCONATE 0.12% ORAL RINSE (MEDLINE KIT)
15.0000 mL | Freq: Two times a day (BID) | OROMUCOSAL | Status: DC
  Administered 2016-10-30 – 2016-11-05 (×13): 15 mL via OROMUCOSAL

## 2016-10-30 MED ORDER — FENTANYL CITRATE (PF) 100 MCG/2ML IJ SOLN
100.0000 ug | INTRAMUSCULAR | Status: DC | PRN
  Administered 2016-10-30 – 2016-11-05 (×18): 100 ug via INTRAVENOUS
  Filled 2016-10-30 (×10): qty 2

## 2016-10-30 MED ORDER — ROCURONIUM BROMIDE 50 MG/5ML IV SOLN
80.0000 mg | Freq: Once | INTRAVENOUS | Status: AC
  Administered 2016-10-30: 80 mg via INTRAVENOUS

## 2016-10-30 MED ORDER — PIPERACILLIN-TAZOBACTAM 3.375 G IVPB
3.3750 g | Freq: Three times a day (TID) | INTRAVENOUS | Status: DC
  Administered 2016-10-30 – 2016-10-31 (×4): 3.375 g via INTRAVENOUS
  Filled 2016-10-30 (×5): qty 50

## 2016-10-30 MED ORDER — FOLIC ACID 5 MG/ML IJ SOLN
1.0000 mg | Freq: Every day | INTRAMUSCULAR | Status: DC
  Administered 2016-10-30 – 2016-11-04 (×5): 1 mg via INTRAVENOUS
  Filled 2016-10-30 (×7): qty 0.2

## 2016-10-30 MED ORDER — FAMOTIDINE IN NACL 20-0.9 MG/50ML-% IV SOLN
20.0000 mg | Freq: Two times a day (BID) | INTRAVENOUS | Status: DC
  Administered 2016-10-30 – 2016-11-04 (×11): 20 mg via INTRAVENOUS
  Filled 2016-10-30 (×11): qty 50

## 2016-10-30 NOTE — Progress Notes (Signed)
PT Cancellation Note  Patient Details Name: Scott Harris MRN: [id number] DOB: Dec 17, 1965   Cancelled Treatment:    Reason Eval/Treat Not Completed: Patient not medically ready Pt not appropriate for PT today. Transferred to ICU and ntubated this morning due to respiratory distress. Will follow up as appropriate.   [name] 10/30/2016, 10:09 AM Wray Kearns, PT, DPT [phone]

## 2016-10-30 NOTE — Progress Notes (Signed)
OT Cancellation Note  Patient Details Name: Scott Harris MRN: [id number] DOB: 04/10/1966   Cancelled Treatment:    Reason Eval/Treat Not Completed: Patient not medically ready Transferred to ICU and ntubated this morning due to respiratory distress. Will follow up as appropriate.  Vonita Moss   OTR/L Pager: [phone] Office: [phone] .  10/30/2016, 11:04 AM

## 2016-10-30 NOTE — Progress Notes (Signed)
Contact lens noted in patient's right eye, removed and thrown away per wife's instructions.  No contact lens was visualized in patient's left eye.

## 2016-10-30 NOTE — Progress Notes (Signed)
Pt transported to and from 3M05 to MRI 1 on ventilator. Pt stable throughout with no complications. RT will continue to monitor.

## 2016-10-30 NOTE — Progress Notes (Signed)
Subjective: The patient became febrile, less responsive and tachypnic overnight. There was concern that he would tire out and would not be able to maintain his airway. He was then transferred to ICU and intubated.   Objective:  Vital signs in last 24 hours: Vitals:   10/30/16 0714 10/30/16 0800 10/30/16 0900 10/30/16 1000  BP: (!) 166/105 128/83 99/76 (!) 151/91  Pulse: (!) 133 (!) 129 (!) 110 (!) 101  Resp: 16 20 16 16   Temp:  (!) 102.6 F (39.2 C)    TempSrc:  Rectal    SpO2: 90% 91% 95% 99%  Weight:      Height:       Physical Exam  Constitutional:  This morning the physical examination was limited. The patient was intubated and sedated in the ICU. He was not following commands. His pupils were reactive to light. Lung sounds were mechanical in nature. On cardiac auscultation the patient was tachycardic but no murmurs rubs or gallops were appreciated. There was no peripheral edema in his lower extremities.     Assessment/Plan:  Principal Problem:   CVA (cerebral vascular accident) ([place]) Active Problems:   Essential hypertension with goal blood pressure less than 130/80   Type 2 diabetes mellitus with complication (HCC)   Altered mental state   Alcoholism ([place])   Seizures ([place])   Cerebral thrombosis with cerebral infarction  The patient is currently intubated and mechanically ventilated. He has been transferred to the ICU for further management and treatment. He is currently under the care of pulmonary and critical care medicine in the intensive care unit. He was transferred to the unit after he was found to have increased fever and work of breathing this morning by the night team. There was concern that the patient would no longer be able to maintain his airway if he was not intubated and he appeared to be tiring out secondary to his increased work of breathing. Based on these findings pulmonary and critical care medicine was consulted and the patient was subsequently  admitted to the ICU, intubated and remains mechanically ventilated.  1. Bilateral frontal stroke and ICA occlusion The patient developed fever and increased work of breathing overnight requiring intubation and mechanical ventilation. He has been admitted to the intensive care unit. -- Stroke team following -- Aspirin 325 mg once daily -- Limited MRI when medically stable -- Clopidogrel, held by interventional radiology request for a minimum of 5 days preceding percutaneous endoscopic gastrostomy tube placement -- Atorvastatin 80 mg daily -- Per stroke team will discontinue valproic acid as it has been associated with hyperammonemia, for seizure prophylaxis we will start Keppra 500 mg twice a day -- Per stroke team will discontinue methylphenidate and start Provigil at 100 mg    2. Hyperammonemia Most recent ammonia level of 50. This is decreased from 74 two days prior. I think the most likely etiology for the patient's hyperammonemia is secondary to valproic acid use. -- Continue lactulose twice a day   3. HTN Allowing permissive hypertension in the setting of an acute ischemic infarct with a goal systolic of 0000000. -- Hold lisinopril  4. Diabetes Hemoglobin A1c of 8.5%. The patient was taking sitagliptin-metformin once daily at home. -- Lantus 30 units daily -- NovoLog 4 units 3 times a day -- Sliding scale insulin  5. Leukocytosis Patient's leukocytosis has worsened. Additionally, the patient became febrile overnight with increased work of breathing. Repeat chest x-ray demonstrated right base subsegmental atelectasis. Because the patient was febrile and  had increased work of breathing he was transferred to the intensive care unit. There was concern that he would not maintain his airway so he was intubated.  -- Follow-up blood cultures -- Zosyn   DVT/PE prophylaxis: Lovenox FEN/GI:Nutrition per tub  Dispo: Anticipated discharge when appropriate.   Ophelia Shoulder,  MD 10/30/2016, 11:19 AM Pager: [phone]

## 2016-10-30 NOTE — Consult Note (Signed)
PULMONARY / CRITICAL CARE MEDICINE   Name: Scott Harris MRN: [id number] DOB: 1966/04/03    ADMISSION DATE:  10/09/2016 CONSULTATION DATE:  10/30/16  REFERRING MD:  Claris Gower  CHIEF COMPLAINT:  Acute respiratory failure, altered MS  HISTORY OF PRESENT ILLNESS:   50 yo man, hx EtOH abuse, HTN, DM, hyperlipidemia, admitted 11/25 with slurred speech, confusion, lethargy. 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 CTA neck and then IR angiogram > severe B carotid disease. It was hoped he would be candidate for CIR after PEG placement for dysphagia. He has experienced progressive altered MS and lethargy since 12/2. A repeat Head CT 12/2 did not show new bleed or edema. No evidence seizure activity noted. Ammonia was slightly elevated and empiric lactulose given.   PAST MEDICAL HISTORY :  He  has a past medical history of Diabetes mellitus without complication ([place]); Hyperlipemia; and Hypertension.  PAST SURGICAL HISTORY: He  has a past surgical history that includes Cholecystectomy; Tonsillectomy; Hand surgery; and ir generic historical (10/23/2016).  No Known Allergies  No current facility-administered medications on file prior to encounter.    Current Outpatient Prescriptions on File Prior to Encounter  Medication Sig  . atorvastatin (LIPITOR) 10 MG tablet Take 5 mg by mouth daily.  Marland Kitchen dexamethasone (DECADRON) 4 MG tablet Take 2 tablets (8 mg total) by mouth 2 (two) times daily.  Marland Kitchen FLUoxetine (PROZAC) 20 MG capsule Take 20 mg by mouth every morning.   . gabapentin (NEURONTIN) 300 MG capsule Take 600 mg by mouth at bedtime.  Marland Kitchen ibuprofen (ADVIL,MOTRIN) 200 MG tablet Take 400 mg by mouth every 4 (four) hours as needed for moderate pain.  Marland Kitchen lansoprazole (PREVACID) 30 MG capsule Take 30 mg by mouth every morning.   . levETIRAcetam (KEPPRA) 500 MG tablet Take 1 tablet (500 mg total) by mouth 2 (two) times daily.  Marland Kitchen lisinopril (PRINIVIL,ZESTRIL) 2.5 MG tablet Take 2.5 mg by mouth daily.  .  methocarbamol (ROBAXIN) 500 MG tablet Can take up to 1-2 tabs every 6 hours PRN PAIN (Patient taking differently: Take 500-1,000 mg by mouth every 6 (six) hours as needed for muscle spasms. )  . omeprazole (PRILOSEC) 20 MG capsule Take 1 capsule (20 mg total) by mouth daily.  Marland Kitchen oxyCODONE-acetaminophen (PERCOCET) 5-325 MG tablet Take 1 tablet by mouth every 4 (four) hours as needed. (Patient taking differently: Take 1 tablet by mouth every 4 (four) hours as needed for severe pain. )  . SitaGLIPtin-MetFORMIN HCl 50-1000 MG TB24 Take 1 tablet by mouth every morning.    FAMILY HISTORY:  His has no family status information on file.    SOCIAL HISTORY: He  reports that he has been smoking Cigarettes.  He has a 30.00 pack-year smoking history. He has never used smokeless tobacco. He reports that he drinks alcohol. He reports that he does not use drugs.  REVIEW OF SYSTEMS:   Pt unable to give  SUBJECTIVE:  Pt was just intubated for AMS and evolving hypoxemia.   VITAL SIGNS: BP (!) 158/97 (BP Location: Right Arm)   Pulse (!) 124   Temp (!) 101.5 F (38.6 C) (Axillary)   Resp (!) 37   Ht 5\' 8"  (1.727 m)   Wt 84.8 kg (186 lb 14.4 oz)   SpO2 95%   BMI 28.42 kg/m   HEMODYNAMICS:    VENTILATOR SETTINGS: FiO2 (%):  [2 %] 2 %  INTAKE / OUTPUT: I/O last 3 completed shifts: In: 7185 [I.V.:3175; [id number]; IV Piggyback:265] Out: [id number] [Urine:2550]  PHYSICAL EXAMINATION: General:  Diaphoretic man, intubated  Neuro:  Paralyzed post intubation HEENT:  Pupils equal and react Cardiovascular:  Tachycardic, no M, regular Lungs:  Coarse B especially on the R, no wheeze.  Abdomen:  Soft, hypoactive bowel sounds. Unable to assess tenderness.  Musculoskeletal:  No edema, no deformities Skin:  Cool and clammy  LABS:  BMET  Recent Labs Lab 10/27/16 0500 10/28/16 1440 10/29/16 1245  NA 135 136 140  K 4.3 4.2 4.2  CL 102 103 105  CO2 21* 24 24  BUN 15 16 19   CREATININE 0.70 0.69 0.84   GLUCOSE 264* 275* 277*    Electrolytes  Recent Labs Lab 10/27/16 0500 10/28/16 1440 10/29/16 1245  CALCIUM 9.7 9.7 9.7    CBC  Recent Labs Lab 10/28/16 1440 10/29/16 0518 10/30/16 0606  WBC 11.6* 14.8* 17.8*  HGB 16.3 16.4 15.8  HCT 45.4 46.8 46.7  PLT 251 298 290    Coag's  Recent Labs Lab 10/28/16 1440  APTT 30  INR 0.98    Sepsis Markers  Recent Labs Lab 10/28/16 1440 10/28/16 1632  LATICACIDVEN 0.9 0.9  PROCALCITON <0.10  --     ABG  Recent Labs Lab 10/30/16 0604  PHART 7.463*  PCO2ART 37.8  PO2ART 66.8*    Liver Enzymes  Recent Labs Lab 10/28/16 1440  AST 20  ALT 33  ALKPHOS 75  BILITOT 0.5  ALBUMIN 3.2*    Cardiac Enzymes No results for input(s): TROPONINI, PROBNP in the last 168 hours.  Glucose  Recent Labs Lab 10/29/16 0806 10/29/16 1226 10/29/16 1753 10/29/16 2031 10/30/16 0043 10/30/16 0401  GLUCAP 303* 275* 219* 251* 286* 267*    Imaging Dg Chest 1 View  Result Date: 10/30/2016 CLINICAL DATA:  Intubation. EXAM: CHEST 1 VIEW COMPARISON:  10/30/2016. FINDINGS: Endotracheal tube noted with its tip 3.9 cm above the carina. NG tube in stable position. Heart size stable. Right base subsegmental atelectasis. Elevation left hemidiaphragm noted. No pleural effusion or pneumothorax. IMPRESSION: 1. Endotracheal tube noted with tip 3.9 cm above the carina. NG tube noted with tip below left hemidiaphragm. 2. Right base subsegmental atelectasis. Elevation left hemidiaphragm. Electronically Signed   By: Marcello Moores  Register   On: 10/30/2016 07:54   Dg Chest Port 1 View  Result Date: 10/30/2016 CLINICAL DATA:  Dyspnea EXAM: PORTABLE CHEST 1 VIEW COMPARISON:  10/28/2016 FINDINGS: Nasogastric tube extends into the stomach and beyond the inferior edge of the image. The lungs are clear, with mild accentuation of basilar markings due to a shallow degree of inspiration. The pulmonary vasculature is normal. There is no pleural effusion.  Hilar, mediastinal and cardiac contours are unremarkable and unchanged. IMPRESSION: Nasogastric tube extends into the stomach. No acute cardiopulmonary findings. Electronically Signed   By: Andreas Newport M.D.   On: 10/30/2016 06:50     STUDIES:  EEG 11/28 and 29 >> no seizure activity or focus. Diffuse slow activity MRI Brain >>  Cerebral angiogram 11/27 >> B ICA stenosis, 100% R, 99% on L  CULTURES: Blood 12/2 >>  Blood 12/4 >>   ANTIBIOTICS: Zosyn 12/4 >>   SIGNIFICANT EVENTS: Urgently moved to ICU 12/4 with acute resp failure and altered MS  LINES/TUBES: ETT 12/4 >>   DISCUSSION: 50 yo man with HTN, DM, EtOH abuse experience d progressive lethargy and resp failure in setting B frontal CVA's. Course has been consistent with progressive poor airway management. No clear evidence for PNA although he did have fever am 12/4. Intubated  and empiric abx added. Needs repeat Ct head to insure no bleed, edema. Suspect that this is progression of this original insult, portends long recovery period to unclear plateau. These factors may make early trach / PEG reasonable.   ASSESSMENT / PLAN:  PULMONARY A: Acute resp failure due to poor airway protection, neurological process Consider aspiration event / PNA, but CXR and course not compelling for this. Fever noted 12/4 04:00 Hx tobacco use  P:   Intubated 12/4 am PRVC 8cc/kg, initial PEEP 10, will plan to wean quickly Add albuterol prn Empiric abx started 12/4 am for possible aspiration event as below Based on his progressive lethargy, neuro injuries, suspect he needs early trach and PEG  NEUROLOGIC A:   Bilateral large frontal CVAs Progressive lethargy and dysphagia, likely due to above. Hyperammonemia was considered earlier in course At risk EtOH withdrawal No evidence seizure activity P:   RASS goal: -1 to 0 ASA 325mg  Lactulose was added 11/29, consider d/c based on wakeup and next ammonia Thiamine + folate  keppra  bid Modafinil  Add intermittent fentanyl + versed for sedation Repeat MRI brain when stable to obtain Case discussed with Dr Erlinda Hong, neurology  CARDIOVASCULAR A:  HTN Hyperlipidemia P:  BP control goal SBP  140 - 170; hydralazine prn Statin ordered  KVO IVF 12/4  RENAL A:   No acute issues P:   Follow BMP intermittently Replete electrolytes as indicated.   GASTROINTESTINAL A:   SUP Nutrition  P:   protonix > changed to pepcid 12/4 am Glucerna TF  HEMATOLOGIC A:    DVT prophylaxis P:  On enoxaparin   INFECTIOUS A:   Fever Possible aspiration PNA P:   Empiric zosyn added 12/4 Check resp cx and CXR, d/c abx if inconsistent w HCAP / asp PNA  ENDOCRINE A:   DM P:   lantus + SSI    FAMILY  - Updates: Updated wife at bedside am 12/4  - Inter-disciplinary family meet or Palliative Care meeting due by:  11/06/16   45 minutes CC time   Baltazar Apo, MD, PhD 10/30/2016, 9:01 AM [facility] [phone] or if no answer [phone]

## 2016-10-30 NOTE — Progress Notes (Signed)
STROKE TEAM PROGRESS NOTE   SUBJECTIVE (INTERVAL HISTORY) His wife and father are at the bedside. I spoke with them at length. Overnight, pt continued to be less responsive, with high grade fever and not able to protect airway, was intubated this am for airway protection. Stat CT was ordered, however, pt became hypotensive after intubation, given 1L bolus, and CT cancelled, will do limited MRI once pt stabilized.   OBJECTIVE Temp:  [97.9 F (36.6 C)-102.6 F (39.2 C)] 102.6 F (39.2 C) (12/04 0800) Pulse Rate:  [110-135] 110 (12/04 0900) Cardiac Rhythm: Sinus tachycardia (12/04 0800) Resp:  [16-37] 16 (12/04 0900) BP: (99-194)/(76-104) 99/76 (12/04 0900) SpO2:  [87 %-96 %] 95 % (12/04 0900) FiO2 (%):  [2 %-100 %] 100 % (12/04 0900) Weight:  [186 lb 14.4 oz (84.8 kg)] 186 lb 14.4 oz (84.8 kg) (12/04 0412)  CBC:   Recent Labs Lab 10/28/16 1440 10/29/16 0518 10/30/16 0606  WBC 11.6* 14.8* 17.8*  NEUTROABS 8.4*  --   --   HGB 16.3 16.4 15.8  HCT 45.4 46.8 46.7  MCV 88.3 89.1 91.0  PLT 251 298 [id number]    Basic Metabolic Panel:   Recent Labs Lab 10/29/16 1245 10/30/16 0606  NA 140 139  K 4.2 4.7  CL 105 107  CO2 24 23  GLUCOSE 277* 289*  BUN 19 20  CREATININE 0.84 0.85  CALCIUM 9.7 9.2    Lipid Panel:     Component Value Date/Time   CHOL 172 10/22/2016 1425   TRIG 157 (H) 10/22/2016 1425   HDL 51 10/22/2016 1425   CHOLHDL 3.4 10/22/2016 1425   VLDL 31 10/22/2016 1425   LDLCALC 90 10/22/2016 1425   HgbA1c:  Lab Results  Component Value Date   HGBA1C 8.5 (H) 10/22/2016   Urine Drug Screen:     Component Value Date/Time   LABOPIA NONE DETECTED 10/20/2016 1437   COCAINSCRNUR NONE DETECTED 10/20/2016 1437   LABBENZ NONE DETECTED 10/20/2016 1437   AMPHETMU NONE DETECTED 10/20/2016 1437   THCU NONE DETECTED 10/20/2016 1437   LABBARB NONE DETECTED 10/20/2016 1437      IMAGING I have personally reviewed the radiological images below and agree with the  radiology interpretations.  MRI 10/20/16 1. Area of diffusion restriction and hyperintense T2 weighted signal within the right frontal white matter. The distribution is not typical of ischemia and the appearance is concerning for a neoplastic process, such as a low grade glioma. Tumefactive demyelinating disease is also a consideration, though if the patient's symptoms are truly acute, some degree of contrast enhancement would be expected. Short-term follow-up with brain MRI with and without contrast in 4-6 weeks is recommended, as change over time may be helpful in diagnosis. Additionally, CSF sampling should be considered. 2. No hemorrhage or mass effect.  MRI 10/22/16 1. Emergent Anterior Circulation Large Vessel Occlusions - both Internal Carotid Arteries likely affected. Progressive bilateral anterior circulation infarcts, newly involving the left MCA/ACA territories. 2. No associated hemorrhage or mass effect at this time. 3. This was discussed by telephone with Dr. Elpidio Anis On 10/22/2016 at 1037 hours. We are arranging for the patient to undergo stat CTA head and neck at the time of this dictation.  CT Head Wo Contrast 10/25/2016 Low-density in the known infarctions affecting the right frontal deep white matter in the left frontal cortical and subcortical brain. No sign of hemorrhagic transformation or significant mass effect/shift. The seems to be a little more involvement in the left basal ganglia  by today's CT.  CTA Head and Neck 10/22/2016 1. Functional occlusion of both ICA siphons with suboptimal reconstitution of flow at both ICA termini: Radiographic string sign stenosis at the right ICA origin in the neck related to bulky low-density plaque or thrombus.  The cervical Right ICA demonstrates thread-like enhancement and appears to occlude at the skullbase.  Suspected much of the Right ICA siphon is fully occluded. Tapered appearance of the cervical Left ICA without  focal stenosis in the neck. There is thread-like enhancement in the Left ICA siphon, but suspected the supraclinoid Left ICA segment is fully occluded.  Thus the tapering in the neck is likely do to distal outflow obstruction. Reconstituted flow from the posterior communicating arteries, which unfortunately are diminutive. No ACA or MCA occlusion identified, but the anterior circulation appears diminutive throughout. 2. Preliminary report of the above was discussed with Dr. Elpidio Anis at 1138 hours. 3. Posterior circulation is largely normal. There does appear to be a moderate or severe atherosclerotic stenosis at the right vertebral artery origin, but no other posterior circulation stenosis. 4. Expected CT appearance of evolving bilateral anterior circulation infarcts. No associated hemorrhage or mass effect. 5. No other abnormality in the neck. Mild emphysema in the lung apices.  Cerebral Angiogram 10/23/2016 Angiographically occluded internal carotid arteries in the cavernous segments at the level of the ophthalmic arteries bilaterally, without reconstitution from the external carotid artery branches. Pre occlusive string sign of the right internal carotid artery proximally extending to the occlusion in the cavernous segment as described above. Approximally 70% stenoses of the left internal carotid artery at the bulb again with a diminutive left internal carotid artery distal to this to its occluded cavernous segment. Approximately 85-90% stenoses at the origin of the right vertebral artery. Retrograde opacification of the anterior cerebral artery distribution bilaterally from the P3 leptomeningeal branches, and from the thalamic perforators from the P2 segment of the posterior cerebral arteries. Occluded pericallosal and callosal marginal branches of the anterior cerebral arteries at the level of the head of the corpus callosum.  CT Head Wo Contrast 10/28/2016 Expected evolution of left ACA  and right MCA territory infarcts without evidence of hemorrhagic transformation, new infarct or other new acute intracranial abnormality.  Portable CXR 1 View 10/30/2016 1. Endotracheal tube noted with tip 3.9 cm above the carina. NG tube noted with tip below left hemidiaphragm. 2. Right base subsegmental atelectasis. Elevation left hemidiaphragm.  EEG   Diffuse slow activity, max over left temporal regions some improvement over the course of the recording.   PHYSICAL EXAM  Temp:  [97.9 F (36.6 C)-102.6 F (39.2 C)] 102.6 F (39.2 C) (12/04 0800) Pulse Rate:  [110-135] 110 (12/04 0900) Resp:  [16-37] 16 (12/04 0900) BP: (99-194)/(76-104) 99/76 (12/04 0900) SpO2:  [87 %-96 %] 95 % (12/04 0900) FiO2 (%):  [2 %-100 %] 100 % (12/04 0900) Weight:  [186 lb 14.4 oz (84.8 kg)] 186 lb 14.4 oz (84.8 kg) (12/04 0412)  General - Well nourished, well developed, intubated and just given sedation for intubation.  Ophthalmologic - Fundi not visualized due to ET positioning.  Cardiovascular - Regular rhythm but tachycardia  Neuro - intubated and on vent, just given sedation for intubation. Diffusely sweating, not responsive to worse or pain. PERRL, mild disconjugation of eyes, no doll's eye present, positive corneal and gag. On pain stimulation, no movement on all limbs. DTR diminished and no babinski. Sensation, coordination and gait not tested.    ASSESSMENT/PLAN [name] is  a 50 y.o. male with history of  newly diagnosed bilateral strokes, DM2, ETOH abuse HLD and HTN  presenting with confusion, L sided numbness and tingling x 2 weeks, neuro consulted for stroke and possible seizure.   Stroke:  Progressive bilateral anterior circulation infarcts in setting of severe bilateral ICA disease - Has uncontrolled DM II, HTN, alcohol and smoking R anterior circulation white matter  subacute infarct  1-2 weeks ago New L MCA/ACA watershed infarct   Resultant  R sided weakness, AMS,  dysphagia  MRI head 10/21/15 - right MCA infarct   MRI head 10/22/16 - Progressive b/l anterior circulation infarcts, right MCA extension and L MCA, ACA and MCA/ACA infarct.  Repeat MRI pending  CTA head and neck - right ICA occlusion at origin and left ICA occlusion at siphon with bilateral distal reconstitution from PCOM.  Cerebral angio - right ICA string sign proximally, b/l ICA occluded at cavernous portion, right VA 85% stenosis  2D Echo - EF 65 - 70%. No cardiac source of emboli identified.  LDL 90  HgbA1c 8.5  Lovenox for VTE prophylaxis Diet NPO time specified  No antithrombotic prior to admission, now on aspirin 325 mg daily. plavix on hold for potential incoming procedure  On provigil now for lethagy  Ongoing aggressive stroke risk factor management  Therapy recommendations: Pending  Disposition:  pending (lives w/ wife, works as a Patent examiner)  Bilateral severe carotid disease  Could be associated with long term uncontrolled DM, HTN and smoking  Cerebra angio as above  Has plan for right VA stent in the future if pt has reasonable recovery  BP goal 140-160  Avoid hypotension  ? Seizure   Off valproic acid due to high ammonia.  On keppra 500mg  bid  EEG diffuse slowing   Close monitoring as pt is on provigil now  Hyperammonemia  Ammonia level  57-74  Currently off lactulose  Ammonia level today pending  Hypertension       Permissive hypertension (OK if < 220/120)   Long-term BP goal 140-160 Avoid hypotension  Hyperlipidemia  Home meds:  lipitor 10  Lipitor increased to 80 in hospital  LDL 90, goal < 70  Continue statin at discharge  Diabetes  HgbA1c 8.5  CBG still high  SSI  On lantus  Consider insulin drip if glucose not in control   Alcohol Abuse  Heavy drinker per wife  Recent fall on 11/9 after birthday party  [place] again last week  CIWA protocol   Dysphagia  Secondary to stroke  Plan for PEG  along with trach if needed  Fever   Leukocytosis 14.7-11.6-14.8-17.8  BCx pending  Sputum culture pending  UA 10/28/16 negative  CXR no acute penumonia  On zosyn  Tobacco abuse  Current smoker  Smoking cessation counseling will be provided  Other Stroke Risk Factors  Overweight, Body mass index is 28.42 kg/m., recommend weight loss, diet and exercise as appropriate  Obstructive sleep apnea, inconsistent use of CPAP at home.   Other Problems    This patient is critically ill due to bilateral stroke, bilateral carotid occlusion, right VA stenosis, fever, seizure, hyperammonemia and at significant risk of neurological worsening, death form recurrent stroke, hemorrhagic transformation, status epilepticus, sepsis, shock and brain death. This patient's care requires constant monitoring of vital signs, hemodynamics, respiratory and cardiac monitoring, review of multiple databases, neurological assessment, discussion with family, other specialists and medical decision making of high complexity. I spent 45 minutes of neurocritical care time  in the care of this patient. I had long discussion with wife and father at bedside, updated pt current condition, treatment options and prognosis.   Rosalin Hawking, MD PhD Stroke Neurology 10/30/2016 10:38 AM

## 2016-10-30 NOTE — Progress Notes (Signed)
Contacted the family Lavor Sacha and informed her that the patient was transferred to 30m.

## 2016-10-30 NOTE — Progress Notes (Signed)
Visited w/ pt's dad in pt's rm. Wife Estill Bamberg is now out in the waiting rm making calls. Provided spiritual/emotional support and prayer. Chaplain available follow-up.    10/30/16 1100  Clinical Encounter Type  Visited With Patient and family together  Visit Type Initial;Psychological support;Spiritual support;Social support;Critical Care  Referral From Nurse  Spiritual Encounters  Spiritual Needs Prayer;Emotional  Stress Factors  Patient Stress Factors Health changes;Loss of control  Family Stress Factors Family relationships;Health changes;Loss of control   Gerrit Heck, Chaplain

## 2016-10-30 NOTE — Significant Event (Signed)
Rapid Response Event Note  Overview: Time Called: 0536 Arrival Time: 0540 Event Type: Respiratory  Initial Focused Assessment: Called to assess patient for respiratory distress.  Per RN, patient is febrile, has increased work of breathing, using his accessory muscles.  Upon assessment, patient was grunting and his airway was being obstructed, IMTS residents at bedside.  Patient was tachycardiac in the 120-130s, sats holding at greater than 92% 2L. SBP in the 150s. Lung sounds rhonchi bilaterally.  Patient was diaphoretic and flushed. Neuro exam, patient did not respond to painful in his upper extremities, but withdrew from in pain in the lower extremities. Pupils 28m bilateral, brisk, and equal. Patient was given APAP for fever. TF held incase of risk of aspiration  On arrival to the ICU, patient work of breathing had increased more so than before, CCM MD notified via E-Link, oxygen sats in the upper 80s-low90s, RSI kit obtained by ICU RNs, RT called, CCM MD at bedside to intubate.   Interventions: -- suctioning or airway, NTS, ABG and CXR ordered and obtained. Nasal trumpet placed by RT -- ICU transfer ordered. -- Labs ordered.   Plan of Care (if not transferred): -- Tx 3M05 -- I asked primary RNs on 5W and IMTS MDs to call the family and notify them of the situation.  Event Summary: Name of Physician Notified: IMTS Resident Dr. PPosey Prontoat [phone] Name of Consulting Physician Notified: CCM MD by IMTS at 0630  Outcome: Transferred (Comment)  Event End Time: 0700  Charl Wellen, PForbes

## 2016-10-30 NOTE — Procedures (Signed)
Intubation Procedure Note Slater Gettler [id number] Jan 09, 1966  Procedure: Intubation Indications: Airway protection and maintenance  Procedure Details Consent: Unable to obtain consent because of altered level of consciousness. Time Out: Verified patient identification, verified procedure, site/side was marked, verified correct patient position, special equipment/implants available, medications/allergies/relevent history reviewed, required imaging and test results available.  Performed  Maximum sterile technique was used including gloves, gown, hand hygiene and mask.  MAC and 3    Evaluation Hemodynamic Status: BP stable throughout; O2 sats: stable throughout Patient's Current Condition: stable Complications: No apparent complications Patient did tolerate procedure well. Chest X-ray ordered to verify placement.  CXR: pending.  Pt intubated for airway protection following rapid response. Intubated using glidescope w #3 blade and 7.5 ETT secured @24  at the lip. Small abrasion noted on bottom right lip post intubation. Pt with clear BBS. Pt SpO2 decreased to 87 post intubation, per MD increase PEEP to 10. Pt stable throughout with not complications. RT will continue to monitor   Scott Harris 10/30/2016

## 2016-10-30 NOTE — Progress Notes (Signed)
Speech Language Pathology Discharge Patient Details Name: Scott Harris MRN: [id number] DOB: [birth year] Today's Date: 10/30/2016 Time:  -     Patient discharged from SLP services secondary to medical decline - will need to re-order SLP to resume therapy services. Intubated. Please reconsult when/if appropriate.   Please see latest therapy progress note for current level of functioning and progress toward goals.    Progress and discharge plan discussed with patient and/or caregiver: Patient unable to participate in discharge planning and no caregivers available  GO     Houston Siren 10/30/2016, 9:43 AM   Orbie Pyo Colvin Caroli.Ed [employer] [phone]

## 2016-10-30 NOTE — Progress Notes (Signed)
PHARMACY - PHYSICIAN COMMUNICATION CRITICAL VALUE ALERT - BLOOD CULTURE IDENTIFICATION (BCID)  Results for orders placed or performed during the hospital encounter of 10/11/2016  Blood Culture ID Panel (Reflexed) (Collected: 10/29/2016  3:28 PM)  Result Value Ref Range   Enterococcus species NOT DETECTED NOT DETECTED   Listeria monocytogenes NOT DETECTED NOT DETECTED   Staphylococcus species DETECTED (A) NOT DETECTED   Staphylococcus aureus NOT DETECTED NOT DETECTED   Methicillin resistance NOT DETECTED NOT DETECTED   Streptococcus species NOT DETECTED NOT DETECTED   Streptococcus agalactiae NOT DETECTED NOT DETECTED   Streptococcus pneumoniae NOT DETECTED NOT DETECTED   Streptococcus pyogenes NOT DETECTED NOT DETECTED   Acinetobacter baumannii NOT DETECTED NOT DETECTED   Enterobacteriaceae species NOT DETECTED NOT DETECTED   Enterobacter cloacae complex NOT DETECTED NOT DETECTED   Escherichia coli NOT DETECTED NOT DETECTED   Klebsiella oxytoca NOT DETECTED NOT DETECTED   Klebsiella pneumoniae NOT DETECTED NOT DETECTED   Proteus species NOT DETECTED NOT DETECTED   Serratia marcescens NOT DETECTED NOT DETECTED   Haemophilus influenzae NOT DETECTED NOT DETECTED   Neisseria meningitidis NOT DETECTED NOT DETECTED   Pseudomonas aeruginosa NOT DETECTED NOT DETECTED   Candida albicans NOT DETECTED NOT DETECTED   Candida glabrata NOT DETECTED NOT DETECTED   Candida krusei NOT DETECTED NOT DETECTED   Candida parapsilosis NOT DETECTED NOT DETECTED   Candida tropicalis NOT DETECTED NOT DETECTED    Name of physician (or Provider) Contacted: Dr. Lamonte Sakai  Changes to prescribed antibiotics required: none, probable contaminant and on Zosyn due to concern for aspiration pneumonia which would cover if felt to be pathogenic.   Duayne Cal 10/30/2016  11:47 AM

## 2016-10-30 NOTE — Procedures (Signed)
Intubation Procedure Note Scott Harris [id number] 09-01-1966  Procedure: Intubation Indications: Airway protection and maintenance  Procedure Details Consent: Unable to obtain consent because of emergent medical necessity. Time Out: Verified patient identification, verified procedure, site/side was marked, verified correct patient position, special equipment/implants available, medications/allergies/relevent history reviewed, required imaging and test results available.  Performed  Drugs Etomidate 20mg , Rocuronium 80mg , Versed 2mg , Fentanyl 23mcg IV DL x 1 with GS 3 blade Grade 1 view 7.5 ET tube passed through cords under direct visualization Placement confirmed with bilateral breath sounds, positive EtCO2 change and smoke in tube   Evaluation Hemodynamic Status: BP stable throughout; O2 sats: stable throughout Patient's Current Condition: stable Complications: No apparent complications Patient did tolerate procedure well. Chest X-ray ordered to verify placement.  CXR: pending.   Scott Harris 10/30/2016

## 2016-10-30 NOTE — Progress Notes (Signed)
[facility] PCCM  Called emergently to the bedside for intubation.  Briefly, recent CVA, had sudden change on the floor this morning with increased work of breathing.  Noted to be in acute respiratory failure with hypoxemia.  I came over from [facility] to emergently intubate him.  Intubation unremarkable.  Vent and sedation orders placed.  Instructed respiratory to suction him.  Full consult note from partner to follow.  Roselie Awkward, MD [place] PCCM Pager: [phone] Cell: [phone] After 3pm or if no response, call [phone]

## 2016-10-30 NOTE — Progress Notes (Signed)
Called to patients bedside for fever and increased respiratory rate. At 4 am he developed fever of 102.6 and was given tylenol 650 mg. At recheck temperature was 101.5 and he had developed increased work of breathing. I went to examine the patient, rapid response was in the rooom. He has increased work of breathing with accessory muscle use and rhonchi on pulmonary exam. Rapid response confirmed gag reflex and cleared secretions.   The fact that he is bed bound makes him high risk for PE but he is on DVT prophylaxis with lovenox and SCDs and has no calf swelling on exam. He had developed leukocytosis 5 days ago but had negative workup for source on infection. Had started zosyn two days ago but with unknown source of infection this was stopped yesterday. ABG was ordered and showed alkylosis with respiratory compensation, he also has an elevated A- A gradient. Chest xray showed no new infiltrate. He has in acute hypoxic respiratory distress.   Stopped tube feeds.  Ordered stat ABG, CBC, BMET, and portable chest xray.  Transferred to ICU

## 2016-10-30 NOTE — Progress Notes (Signed)
Notified the on call Internal Medicine Dr. Hetty Ely of the blood pressure of 194/97 and heart rate of 135.  Ordered to recheck manually.  Notified Dr. Hetty Ely Bp: 172/90 and Hr: 120. Informed to continue to monitor.  Will notify as needed

## 2016-10-30 NOTE — Progress Notes (Signed)
Pharmacy Antibiotic Note  Garwood Rumbley is a 50 y.o. male admitted on 10/04/2016 with pneumonia.  Pharmacy has been consulted for zosyn dosing.  Plan: Zosyn 3.375g IV q8h (4 hour infusion).  Height: 5\' 8"  (172.7 cm) Weight: 186 lb 14.4 oz (84.8 kg) IBW/kg (Calculated) : 68.4  Temp (24hrs), Avg:100.1 F (37.8 C), Min:97.9 F (36.6 C), Max:102.6 F (39.2 C)   Recent Labs Lab 10/25/16 0350 10/27/16 0500 10/28/16 0503 10/28/16 1440 10/28/16 1632 10/29/16 0518 10/29/16 1245 10/30/16 0606  WBC 13.8* 14.7* 15.0* 11.6*  --  14.8*  --  17.8*  CREATININE 0.71 0.70  --  0.69  --   --  0.84  --   LATICACIDVEN  --   --   --  0.9 0.9  --   --   --     Estimated Creatinine Clearance: 114.1 mL/min (by C-G formula based on SCr of 0.84 mg/dL).    No Known Allergies  Antimicrobials this admission: Zosyn 12/2 >> 12/3  -  12/4 >>  Dose adjustments this admission: n/a  Microbiology results: 12/2 BCx: ngtd  UCx:    Sputum:   12/4 MRSA PCR: neg   Andrey Cota. Diona Foley, PharmD, [place] Clinical Pharmacist Pager [phone] 10/30/2016 7:54 AM

## 2016-10-31 ENCOUNTER — Inpatient Hospital Stay (HOSPITAL_COMMUNITY): Payer: 59

## 2016-10-31 DIAGNOSIS — R4182 Altered mental status, unspecified: Secondary | ICD-10-CM

## 2016-10-31 DIAGNOSIS — J9600 Acute respiratory failure, unspecified whether with hypoxia or hypercapnia: Secondary | ICD-10-CM

## 2016-10-31 LAB — CSF CELL COUNT WITH DIFFERENTIAL
RBC COUNT CSF: 4 /mm3 — AB
RBC Count, CSF: 7 /mm3 — ABNORMAL HIGH
TUBE #: 4
Tube #: 1
WBC, CSF: 2 /mm3 (ref 0–5)
WBC, CSF: 4 /mm3 (ref 0–5)

## 2016-10-31 LAB — GLUCOSE, CAPILLARY
GLUCOSE-CAPILLARY: 137 mg/dL — AB (ref 65–99)
GLUCOSE-CAPILLARY: 145 mg/dL — AB (ref 65–99)
GLUCOSE-CAPILLARY: 156 mg/dL — AB (ref 65–99)
GLUCOSE-CAPILLARY: 161 mg/dL — AB (ref 65–99)
GLUCOSE-CAPILLARY: 162 mg/dL — AB (ref 65–99)
GLUCOSE-CAPILLARY: 181 mg/dL — AB (ref 65–99)
GLUCOSE-CAPILLARY: 184 mg/dL — AB (ref 65–99)
GLUCOSE-CAPILLARY: 200 mg/dL — AB (ref 65–99)
GLUCOSE-CAPILLARY: 204 mg/dL — AB (ref 65–99)
GLUCOSE-CAPILLARY: 221 mg/dL — AB (ref 65–99)
GLUCOSE-CAPILLARY: 230 mg/dL — AB (ref 65–99)
GLUCOSE-CAPILLARY: 249 mg/dL — AB (ref 65–99)
Glucose-Capillary: 197 mg/dL — ABNORMAL HIGH (ref 65–99)
Glucose-Capillary: 219 mg/dL — ABNORMAL HIGH (ref 65–99)
Glucose-Capillary: 194 mg/dL — ABNORMAL HIGH (ref 65–99)
Glucose-Capillary: 209 mg/dL — ABNORMAL HIGH (ref 65–99)
Glucose-Capillary: 199 mg/dL — ABNORMAL HIGH (ref 65–99)
Glucose-Capillary: 167 mg/dL — ABNORMAL HIGH (ref 65–99)
Glucose-Capillary: 192 mg/dL — ABNORMAL HIGH (ref 65–99)
Glucose-Capillary: 177 mg/dL — ABNORMAL HIGH (ref 65–99)
Glucose-Capillary: 145 mg/dL — ABNORMAL HIGH (ref 65–99)
Glucose-Capillary: 164 mg/dL — ABNORMAL HIGH (ref 65–99)

## 2016-10-31 LAB — BLOOD GAS, ARTERIAL
ACID-BASE EXCESS: 3 mmol/L — AB (ref 0.0–2.0)
Bicarbonate: 27.2 mmol/L (ref 20.0–28.0)
DRAWN BY: 462031
FIO2: 0.4
MECHVT: 570 mL
O2 Saturation: 97.8 %
PATIENT TEMPERATURE: 98.6
PCO2 ART: 42.6 mmHg (ref 32.0–48.0)
PEEP/CPAP: 10 cmH2O
PH ART: 7.42 (ref 7.350–7.450)
PO2 ART: 110 mmHg — AB (ref 83.0–108.0)
RATE: 16 resp/min

## 2016-10-31 LAB — BASIC METABOLIC PANEL
ANION GAP: 9 (ref 5–15)
BUN: 19 mg/dL (ref 6–20)
CALCIUM: 9.1 mg/dL (ref 8.9–10.3)
CHLORIDE: 105 mmol/L (ref 101–111)
CO2: 27 mmol/L (ref 22–32)
Creatinine, Ser: 0.69 mg/dL (ref 0.61–1.24)
GFR calc Af Amer: >60 mL/min (ref >60)
GFR calc non Af Amer: >60 mL/min (ref >60)
GLUCOSE: 229 mg/dL — AB (ref 65–99)
Potassium: 4.1 mmol/L (ref 3.5–5.1)
Sodium: 141 mmol/L (ref 135–145)

## 2016-10-31 LAB — PROTEIN AND GLUCOSE, CSF
GLUCOSE CSF: 125 mg/dL — AB (ref 40–70)
Total  Protein, CSF: 34 mg/dL (ref 15–45)

## 2016-10-31 LAB — AMMONIA: AMMONIA: 49 umol/L — AB (ref 9–35)

## 2016-10-31 LAB — PROCALCITONIN

## 2016-10-31 MED ORDER — CEFAZOLIN SODIUM-DEXTROSE 2-4 GM/100ML-% IV SOLN: 2.0000 g | INTRAVENOUS | Status: DC

## 2016-10-31 MED ORDER — VANCOMYCIN HCL 10 G IV SOLR
1500.0000 mg | Freq: Once | INTRAVENOUS | Status: AC
  Administered 2016-10-31: 1500 mg via INTRAVENOUS
  Filled 2016-10-31: qty 1500

## 2016-10-31 MED ORDER — DEXTROSE 5 % IV SOLN
2.0000 g | Freq: Three times a day (TID) | INTRAVENOUS | Status: DC
  Administered 2016-10-31 – 2016-11-02 (×6): 2 g via INTRAVENOUS
  Filled 2016-10-31 (×9): qty 2

## 2016-10-31 MED ORDER — VANCOMYCIN HCL IN DEXTROSE 1-5 GM/200ML-% IV SOLN
1000.0000 mg | Freq: Three times a day (TID) | INTRAVENOUS | Status: DC
  Administered 2016-10-31 – 2016-11-01 (×2): 1000 mg via INTRAVENOUS
  Filled 2016-10-31 (×4): qty 200

## 2016-10-31 MED ORDER — ENOXAPARIN SODIUM 40 MG/0.4ML ~~LOC~~ SOLN: 40.0000 mg | SUBCUTANEOUS | Status: DC

## 2016-10-31 MED ORDER — LIDOCAINE HCL (PF) 1 % IJ SOLN
INTRAMUSCULAR | Status: AC
  Administered 2016-10-31: 13:00:00
  Filled 2016-10-31: qty 5

## 2016-10-31 MED ORDER — PRO-STAT SUGAR FREE PO LIQD
30.0000 mL | Freq: Every day | ORAL | Status: DC
  Administered 2016-10-31 – 2016-11-05 (×6): 30 mL
  Filled 2016-10-31 (×5): qty 30

## 2016-10-31 NOTE — Progress Notes (Signed)
EEG completed, results pending. 

## 2016-10-31 NOTE — Progress Notes (Signed)
Patient ID: Scott Harris, male   DOB: 1966-04-17, 50 y.o.   MRN: [id number]   Plan for possible Percutaneous gastric tube placement in IR 12/6  Temp still 100 or so Wbc higher today at 17.6  Will check labs and temp in am Off Plavix 5 days now

## 2016-10-31 NOTE — Procedures (Signed)
Lumbar Puncture Procedure Note  Pre-operative Diagnosis: r/o men / enceph  Post-operative Diagnosis: r/o enceph  Indications: Diagnostic  Procedure Details   Consent: Informed consent was obtained. Risks of the procedure were discussed including: infection, bleeding, pain and headache.  The patient was positioned under sterile conditions. Betadine solution and sterile drapes were utilized. A spinal needle was inserted at the L3 - L4 interspace.  Spinal fluid was obtained and sent to the laboratory.  Findings 37mL of clear spinal fluid was obtained. Opening Pressure: elevated cm H2O pressure. Closing Pressure: 19 cm H2O pressure.  Complications:  None; patient tolerated the procedure well.        Condition: stable  Plan Bed rest for 5 hours.  Lavon Paganini. Titus Mould, MD, [place] Pgr: [place] [facility]

## 2016-10-31 NOTE — Progress Notes (Signed)
Nutrition Follow-up  INTERVENTION:   Continue: Glucerna 1.2at 27m/h  Add 30 ml Prostat daily Provides 2116kcals, 116gm protein, 13586mfree water daily.  NUTRITION DIAGNOSIS:   Inadequate oral intake related to inability to eat as evidenced by NPO status. Ongoing.   GOAL:   Patient will meet greater than or equal to 90% of their needs Met.   MONITOR:   Labs, Weight trends, TF tolerance, I & O's  ASSESSMENT:   [age]yoan with PMH of DM2, HTN, HLD who presented with slurred speech, lethargy and confusion with CVA.  12/4 pt intubated for airway protection in light of obtundation Plan for trach and PEG.   Remains on insulin drip but need as decreased per RN.  Pt discussed during ICU rounds and with RN.   Patient is currently intubated on ventilator support MV: 9.6 L/min Temp (24hrs), Avg:100.8 F (38.2 C), Min:98.6 F (37 C), Max:103.5 F (39.7 C)  Medications reviewed and include: folic acid, thiamine CBG's: [phone] tube in place, tip in proximal stomach  Diet Order:  Diet NPO time specified Except for: Sips with Meds  Skin:  Reviewed, no issues  Last BM:  12/3  Height:   Ht Readings from Last 1 Encounters:  10/03/2016 5' 8"  (1.727 m)    Weight:   Wt Readings from Last 1 Encounters:  10/31/16 190 lb 0.6 oz (86.2 kg)    Ideal Body Weight:  70 kg  BMI:  Body mass index is 28.89 kg/m.  Estimated Nutritional Needs:   Kcal:  2106  Protein:  105-115 grams  Fluid:  >/= 2 L  EDUCATION NEEDS:   No education needs identified at this time  HeDwight MissionLDCarthageCNBell Hillager [phone] Hours Pager

## 2016-10-31 NOTE — Consult Note (Signed)
PULMONARY / CRITICAL CARE MEDICINE   Name: Scott Harris MRN: [id number] DOB: [birth year]    ADMISSION DATE:  10/20/2016 CONSULTATION DATE:  10/30/16  REFERRING MD:  Claris Gower  CHIEF COMPLAINT:  Acute respiratory failure, altered MS  HISTORY OF PRESENT ILLNESS:   50 yo man, hx EtOH abuse, HTN, DM, hyperlipidemia, admitted 11/25 with slurred speech, confusion, lethargy. 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 CTA neck and then IR angiogram > severe B carotid disease. It was hoped he would be candidate for CIR after PEG placement for dysphagia. He has experienced progressive altered MS and lethargy since 12/2. A repeat Head CT 12/2 did not show new bleed or edema. No evidence seizure activity noted. Ammonia was slightly elevated and empiric lactulose given.   SUBJECTIVE:  Biting tube   VITAL SIGNS: BP (!) 149/95   Pulse 95   Temp 98.6 F (37 C) (Rectal)   Resp 16   Ht 5\' 8"  (1.727 m)   Wt 190 lb 0.6 oz (86.2 kg)   SpO2 98%   BMI 28.89 kg/m   HEMODYNAMICS:    VENTILATOR SETTINGS: Vent Mode: PRVC FiO2 (%):  [40 %-100 %] 40 % Set Rate:  [16 bmp] 16 bmp Vt Set:  [570 mL] 570 mL PEEP:  [8 cmH20-10 cmH20] 8 cmH20 Plateau Pressure:  [18 cmH20-28 cmH20] 28 cmH20  INTAKE / OUTPUT: I/O last 3 completed shifts: In: 6376.3 [I.V.:2756.8; [id number]; IV Piggyback:150] Out: 1895 [Urine:1895]  PHYSICAL EXAMINATION: General: Sedated, intubated , left gaze preferenxce Neuro:  Biting tube, No follows commands HEENT:  Pupils equal and react Cardiovascular:  Tachycardic, no M, regular Lungs:  Coarse Bil Abdomen:  Soft, hypoactive bowel sounds. Unable to assess tenderness.  Musculoskeletal:  No edema, no deformities Skin:  warm  LABS:  BMET  Recent Labs Lab 10/29/16 1245 10/30/16 0606 10/31/16 0252  NA 140 139 141  K 4.2 4.7 4.1  CL 105 107 105  CO2 24 23 27   BUN 19 20 19   CREATININE 0.84 0.85 0.69  GLUCOSE 277* 289* 229*    Electrolytes  Recent Labs Lab  10/29/16 1245 10/30/16 0606 10/31/16 0252  CALCIUM 9.7 9.2 9.1    CBC  Recent Labs Lab 10/28/16 1440 10/29/16 0518 10/30/16 0606  WBC 11.6* 14.8* 17.8*  HGB 16.3 16.4 15.8  HCT 45.4 46.8 46.7  PLT 251 298 290    Coag's  Recent Labs Lab 10/28/16 1440  APTT 30  INR 0.98    Sepsis Markers  Recent Labs Lab 10/28/16 1440 10/28/16 1632 10/30/16 1006 10/31/16 0252  LATICACIDVEN 0.9 0.9  --   --   PROCALCITON <0.10  --  0.24 <0.10    ABG  Recent Labs Lab 10/30/16 0604 10/30/16 0915 10/31/16 0416  PHART 7.463* 7.319* 7.420  PCO2ART 37.8 54.7* 42.6  PO2ART 66.8* 171* 110*    Liver Enzymes  Recent Labs Lab 10/28/16 1440 10/30/16 0606  AST 20 27  ALT 33 35  ALKPHOS 75 71  BILITOT 0.5 0.8  ALBUMIN 3.2* 3.0*    Cardiac Enzymes No results for input(s): TROPONINI, PROBNP in the last 168 hours.  Glucose  Recent Labs Lab 10/31/16 0134 10/31/16 0235 10/31/16 0236 10/31/16 0342 10/31/16 0442 10/31/16 0801  GLUCAP 197* 219* 204* 194* 209* 181*    Imaging Dg Chest Port 1 View  Result Date: 10/31/2016 CLINICAL DATA:  50 year old male status post bilateral anterior circulation thrombosis and cerebral infarcts. Intubated. Initial encounter. EXAM: PORTABLE CHEST 1 VIEW COMPARISON:  10/30/2016 and earlier.  FINDINGS: Portable AP semi upright view at at 0641 hours. Stable position of the endotracheal tube. Enteric tube courses to the abdomen, tip not included. Improved left lung base ventilation and decreased elevation of the left hemidiaphragm. Mild platelike atelectasis at both lung bases, also regressed on the right. Incidental azygos fissure. No pneumothorax, pulmonary edema or pleural effusion. Normal cardiac size and mediastinal contours. IMPRESSION: 1.  Stable lines and tubes. 2. Improved ventilation since yesterday. Mild residual bibasilar atelectasis. Electronically Signed   By: Genevie Ann M.D.   On: 10/31/2016 08:28   Mr Brain Limited Wo  Contrast  Result Date: 10/30/2016 CLINICAL DATA:  50 year old diabetic hypertensive male with hyperlipidemia presenting with occluded internal carotid arteries. Unresponsive. Subsequent encounter. EXAM: MRI HEAD WITHOUT CONTRAST TECHNIQUE: Multiplanar, multiecho pulse sequences of the brain and surrounding structures were obtained without intravenous contrast. COMPARISON:  11/17/2016 head CT. 10/23/2016 angiogram. 10/22/2016 CT angiogram. 10/22/2016 and 10/20/2016 brain MR. FINDINGS: Exam is limited to 4 sequences per request. Brain: Subacute moderate-size right frontal lobe subcortical infarct (atypical distribution however, given the abnormal vasculature, this is felt to represent a subacute infarct). Compared with 10/22/2016 MR, progressive enlargement of large left frontal lobe infarct now involving portions of the left caudate head and lenticular nucleus. Small area of extension to the left sub insular region. Local mass effect. Gradient and T1 imaging was not performed however, findings are suspicious for the presence of small amount of hemorrhage with these infarcts. Vascular: Occluded internal carotid arteries with reconstitution of supraclinoid aspect which appears suboptimal given the infarcts. Slow flow within middle cerebral artery branches bilaterally. Skull and upper cervical spine: No change. Sinuses/Orbits: Prominent opacification/ mucosal thickening paranasal sinuses most notable right maxillary sinus and ethmoid sinus air cells bilaterally. Other: Negative. IMPRESSION: Subacute moderate-size right frontal lobe subcortical infarct. Overall size relatively similar prior exam. Compared with 10/22/2016 MR, progressive enlargement of large left frontal lobe infarct now involving portions of the left caudate head and lenticular nucleus. Small area of extension to the left sub insular region. Local mass effect. Gradient and T1 imaging was not performed however, findings are suspicious for the presence  of small amount of hemorrhage with these infarcts. Occluded internal carotid arteries with reconstitution of supraclinoid aspect which appears suboptimal given the infarcts. Slow flow within middle cerebral artery branches bilaterally. Prominent opacification/ mucosal thickening paranasal sinuses most notable right maxillary sinus and ethmoid sinus air cells bilaterally. Electronically Signed   By: Genia Del M.D.   On: 10/30/2016 14:39     STUDIES:  EEG 11/28 and 29 >> no seizure activity or focus. Diffuse slow activity MRI Brain >>  Cerebral angiogram 11/27 >> B ICA stenosis, 100% R, 99% on L  CULTURES: Blood 12/2 >>  Blood 12/4 >>  Sputum 12/5>>  ANTIBIOTICS: Zosyn 12/4 >>   SIGNIFICANT EVENTS: 12/5 biting ETT, fevers  LINES/TUBES: ETT 12/4 >>   DISCUSSION: 50 yo man with HTN, DM, EtOH abuse experience d progressive lethargy and resp failure in setting B frontal CVA's. Course has been consistent with progressive poor airway management. No clear evidence for PNA although he did have fever am 12/4. Intubated and empiric abx added. Needs repeat Ct head to insure no bleed, edema. Suspect that this is progression of this original insult, portends long recovery period to unclear plateau. These factors may make early trach / PEG reasonable.   ASSESSMENT / PLAN:  PULMONARY A: Acute resp failure due to poor airway protection, neurological process Consider aspiration event / PNA,  but CXR and course not compelling for this. Fever noted 12/4 04:00 Hx tobacco use  P:   Intubated 12/4 am PRVC 8cc/kg, initial PEEP 8, will plan to wean as possible. He may need trach due to brain injury that is progressive. Add albuterol prn Empiric abx started 12/4 am for possible aspiration event as below Based on his progressive lethargy, neuro injuries, suspect he needs early trach and PEG  NEUROLOGIC A:   Bilateral large frontal CVAs Progressive lethargy and dysphagia, likely due to above.  Hyperammonemia was considered earlier in course At risk EtOH withdrawal No evidence seizure activity 12/5 biting ETT P:   RASS goal: -1 to 0 ASA 325mg  Lactulose was added 11/29, consider d/c based on wakeup and next ammonia Thiamine + folate  keppra bid Modafinil  Add intermittent fentanyl + versed for sedation Repeat MRI brain when stable to obtain Case discussed with Dr Erlinda Hong, neurology  CARDIOVASCULAR A:  HTN Hyperlipidemia P:  BP control goal SBP  140 - 170; hydralazine prn Statin ordered  KVO IVF 12/4  RENAL  Recent Labs Lab 10/29/16 1245 10/30/16 0606 10/31/16 0252  K 4.2 4.7 4.1     A:   No acute issues P:   Follow BMP intermittently Replete electrolytes as indicated.   GASTROINTESTINAL A:   SUP Nutrition  P:   protonix > changed to pepcid 12/4 am Glucerna TF  HEMATOLOGIC A:    DVT prophylaxis P:  On enoxaparin   INFECTIOUS A:   Fever Possible aspiration PNA P:   Empiric zosyn added 12/4 Check resp cx and CXR, d/c abx if inconsistent w HCAP / asp PNA  ENDOCRINE CBG (last 3)   Recent Labs  10/31/16 0342 10/31/16 0442 10/31/16 0801  GLUCAP 194* 209* 181*     A:   DM P:   lantus + SSI    FAMILY  - Updates: Updated wife at bedside am 12/5  - Inter-disciplinary family meet or Palliative Care meeting due by:  11/06/16   30 min->CCT    Richardson Landry Minor ACNP [facility] PCCM Pager [phone] till 3 pm If no answer page [phone] 10/31/2016, 8:42 AM  STAFF NOTE: I, Merrie Roof, MD FACP have personally reviewed patient's available data, including medical history, events of note, physical examination and test results as part of my evaluation. I have discussed with resident/NP and other care providers such as pharmacist, RN and RRT. In addition, I personally evaluated patient and elicited key findings of: moves rt lower to pain, opens eyes, perr sluggish , forced deviation upward, fevers, needs LP, eeg, r/o subclinical seizures, r/o  men/ enceph causing cva, will change abx to ceftaz, vanc, dc zosyn, use benzo prn, feeding , thiamine, folic , mvi,  lovenox held fo rLP, will also assess opening pressures, asa, I updated family in room The patient is critically ill with multiple organ systems failure and requires high complexity decision making for assessment and support, frequent evaluation and titration of therapies, application of advanced monitoring technologies and extensive interpretation of multiple databases.   Critical Care Time devoted to patient care services described in this note is 30 Minutes. This time reflects time of care of this signee: Merrie Roof, MD FACP. This critical care time does not reflect procedure time, or teaching time or supervisory time of PA/NP/Med student/Med Resident etc but could involve care discussion time. Rest per NP/medical resident whose note is outlined above and that I agree with   Lavon Paganini. Titus Mould, MD, [place] Pgr: [phone]  [place] [facility] 10/31/2016 12:07 PM

## 2016-10-31 NOTE — Care Management Note (Signed)
Case Management Note  Patient Details  Name: Dilon Sabetta MRN: [id number] Date of Birth: [birth year]  Subjective/Objective:   Pt admitted to 60[facility] on 12/5 with AMS, fever and inability to protect airway.  Pt initially admitted on 11/25 with bilateral strokes.  PTA, pt independent and living with spouse and children.                  Action/Plan: Pt remains intubated.   Last PT/OT session on 11/30 recommending CIR, though spouse maintains that she cannot provide care at dc.  Pt will likely need SNF if no caregiver available at dc.  Will follow as pt progresses.    Expected Discharge Date:  10/24/16               Expected Discharge Plan:  [place]  In-House Referral:  Clinical Social Work  Discharge planning Services  CM Consult  Post Acute Care Choice:    Choice offered to:     DME Arranged:    DME Agency:     HH Arranged:    [place] Agency:     Status of Service:  In process, will continue to follow  If discussed at Long Length of Stay Meetings, dates discussed:    Additional Comments:  Reinaldo Raddle, RN, BSN  Trauma/Neuro ICU Case Manager [phone]

## 2016-10-31 NOTE — Progress Notes (Signed)
Rehab admissions - Continue to feel that patient will need SNF placement.  He does not have caregiver support after a potential acute inpatient rehab stay.  [id number]

## 2016-10-31 NOTE — Progress Notes (Signed)
Bite block placed at this time. Small amount of blood due to biting his tounge.

## 2016-10-31 NOTE — Progress Notes (Signed)
STROKE TEAM PROGRESS NOTE   SUBJECTIVE (INTERVAL HISTORY) His wife and father are at the bedside. I informed them about yesterday MRI result which showed continued progression of the left brain infarcts. Not sure if that was the cause for his worsening of mental status prior to intubation or due to transient hypotension post intubation. Still has low grade fever and CXR looks good. Will need EEG to rule out NCSE and LP to rule out CNS infection.    OBJECTIVE Temp:  [98.6 F (37 C)-103.5 F (39.7 C)] 100.6 F (38.1 C) (12/05 0922) Pulse Rate:  [83-112] 88 (12/05 0900) Cardiac Rhythm: Normal sinus rhythm (12/05 0600) Resp:  [13-28] 16 (12/05 0900) BP: (112-169)/(82-113) 140/93 (12/05 0900) SpO2:  [94 %-100 %] 97 % (12/05 0900) FiO2 (%):  [40 %-100 %] 40 % (12/05 0720) Weight:  [190 lb 0.6 oz (86.2 kg)] 190 lb 0.6 oz (86.2 kg) (12/05 0443)  CBC:   Recent Labs Lab 10/28/16 1440 10/29/16 0518 10/30/16 0606  WBC 11.6* 14.8* 17.8*  NEUTROABS 8.4*  --   --   HGB 16.3 16.4 15.8  HCT 45.4 46.8 46.7  MCV 88.3 89.1 91.0  PLT 251 298 [id number]    Basic Metabolic Panel:   Recent Labs Lab 10/30/16 0606 10/31/16 0252  NA 139 141  K 4.7 4.1  CL 107 105  CO2 23 27  GLUCOSE 289* 229*  BUN 20 19  CREATININE 0.85 0.69  CALCIUM 9.2 9.1    Lipid Panel:     Component Value Date/Time   CHOL 172 10/22/2016 1425   TRIG 157 (H) 10/22/2016 1425   HDL 51 10/22/2016 1425   CHOLHDL 3.4 10/22/2016 1425   VLDL 31 10/22/2016 1425   LDLCALC 90 10/22/2016 1425   HgbA1c:  Lab Results  Component Value Date   HGBA1C 8.5 (H) 10/22/2016   Urine Drug Screen:     Component Value Date/Time   LABOPIA NONE DETECTED 10/20/2016 1437   COCAINSCRNUR NONE DETECTED 10/20/2016 1437   LABBENZ NONE DETECTED 10/20/2016 1437   AMPHETMU NONE DETECTED 10/20/2016 1437   THCU NONE DETECTED 10/20/2016 1437   LABBARB NONE DETECTED 10/20/2016 1437      IMAGING I have personally reviewed the radiological images  below and agree with the radiology interpretations.  MRI 10/20/16 1. Area of diffusion restriction and hyperintense T2 weighted signal within the right frontal white matter. The distribution is not typical of ischemia and the appearance is concerning for a neoplastic process, such as a low grade glioma. Tumefactive demyelinating disease is also a consideration, though if the patient's symptoms are truly acute, some degree of contrast enhancement would be expected. Short-term follow-up with brain MRI with and without contrast in 4-6 weeks is recommended, as change over time may be helpful in diagnosis. Additionally, CSF sampling should be considered. 2. No hemorrhage or mass effect.  MRI 10/22/16 1. Emergent Anterior Circulation Large Vessel Occlusions - both Internal Carotid Arteries likely affected. Progressive bilateral anterior circulation infarcts, newly involving the left MCA/ACA territories. 2. No associated hemorrhage or mass effect at this time. 3. This was discussed by telephone with Dr. Elpidio Anis On 10/22/2016 at 1037 hours. We are arranging for the patient to undergo stat CTA head and neck at the time of this dictation.  CT Head Wo Contrast 10/25/2016 Low-density in the known infarctions affecting the right frontal deep white matter in the left frontal cortical and subcortical brain. No sign of hemorrhagic transformation or significant mass effect/shift. The seems  to be a little more involvement in the left basal ganglia by today's CT.  CTA Head and Neck 10/22/2016 1. Functional occlusion of both ICA siphons with suboptimal reconstitution of flow at both ICA termini: Radiographic string sign stenosis at the right ICA origin in the neck related to bulky low-density plaque or thrombus.  The cervical Right ICA demonstrates thread-like enhancement and appears to occlude at the skullbase.  Suspected much of the Right ICA siphon is fully occluded. Tapered appearance of the  cervical Left ICA without focal stenosis in the neck. There is thread-like enhancement in the Left ICA siphon, but suspected the supraclinoid Left ICA segment is fully occluded.  Thus the tapering in the neck is likely do to distal outflow obstruction. Reconstituted flow from the posterior communicating arteries, which unfortunately are diminutive. No ACA or MCA occlusion identified, but the anterior circulation appears diminutive throughout. 2. Preliminary report of the above was discussed with Dr. Elpidio Anis at 1138 hours. 3. Posterior circulation is largely normal. There does appear to be a moderate or severe atherosclerotic stenosis at the right vertebral artery origin, but no other posterior circulation stenosis. 4. Expected CT appearance of evolving bilateral anterior circulation infarcts. No associated hemorrhage or mass effect. 5. No other abnormality in the neck. Mild emphysema in the lung apices.  Cerebral Angiogram 10/23/2016 Angiographically occluded internal carotid arteries in the cavernous segments at the level of the ophthalmic arteries bilaterally, without reconstitution from the external carotid artery branches. Pre occlusive string sign of the right internal carotid artery proximally extending to the occlusion in the cavernous segment as described above. Approximally 70% stenoses of the left internal carotid artery at the bulb again with a diminutive left internal carotid artery distal to this to its occluded cavernous segment. Approximately 85-90% stenoses at the origin of the right vertebral artery. Retrograde opacification of the anterior cerebral artery distribution bilaterally from the P3 leptomeningeal branches, and from the thalamic perforators from the P2 segment of the posterior cerebral arteries. Occluded pericallosal and callosal marginal branches of the anterior cerebral arteries at the level of the head of the corpus callosum.  CT Head Wo  Contrast 10/28/2016 Expected evolution of left ACA and right MCA territory infarcts without evidence of hemorrhagic transformation, new infarct or other new acute intracranial abnormality.  EEG   Diffuse slow activity, max over left temporal regions some improvement over the course of the recording.   Dg Chest Port 1 View 10/31/2016 IMPRESSION: 1.  Stable lines and tubes. 2. Improved ventilation since yesterday. Mild residual bibasilar atelectasis.   Mr Brain Limited Wo Contrast 10/30/2016 IMPRESSION: Subacute moderate-size right frontal lobe subcortical infarct. Overall size relatively similar prior exam. Compared with 10/22/2016 MR, progressive enlargement of large left frontal lobe infarct now involving portions of the left caudate head and lenticular nucleus. Small area of extension to the left sub insular region. Local mass effect. Gradient and T1 imaging was not performed however, findings are suspicious for the presence of small amount of hemorrhage with these infarcts. Occluded internal carotid arteries with reconstitution of supraclinoid aspect which appears suboptimal given the infarcts. Slow flow within middle cerebral artery branches bilaterally. Prominent opacification/ mucosal thickening paranasal sinuses most notable right maxillary sinus and ethmoid sinus air cells bilaterally.   PHYSICAL EXAM  Temp:  [98.6 F (37 C)-103.5 F (39.7 C)] 100.6 F (38.1 C) (12/05 0922) Pulse Rate:  [83-112] 88 (12/05 0900) Resp:  [13-28] 16 (12/05 0900) BP: (112-169)/(82-113) 140/93 (12/05 0900) SpO2:  [94 %-100 %]  97 % (12/05 0900) FiO2 (%):  [40 %-100 %] 40 % (12/05 0720) Weight:  [190 lb 0.6 oz (86.2 kg)] 190 lb 0.6 oz (86.2 kg) (12/05 0443)  General - Well nourished, well developed, intubated and no sedation.   Ophthalmologic - Fundi not visualized due to upward gaze and not able to position the ophthoscope.  Cardiovascular - Regular rhythm and rate  Neuro - intubated and on vent, not  on sedation. Not open eyes on voice, but on pain stimulation, pt tries to open eyes and barely opened his eyes. PERRL, upward gaze with disconjugation of eyes, sluggish doll's eye present, positive corneal and gag. On pain stimulation, left LE 2/5 withdraw but other limbs minimal movement. DTR diminished and no babinski. Sensation, coordination and gait not tested.    ASSESSMENT/PLAN [name] is a 50 y.o. male with history of  newly diagnosed bilateral strokes, DM2, ETOH abuse HLD and HTN  presenting with confusion, L sided numbness and tingling x 2 weeks, neuro consulted for stroke and possible seizure.   Stroke:  Progressive bilateral anterior circulation infarcts in setting of bilateral ICA occlusion - Has uncontrolled DM II, HTN, alcohol and smoking R anterior circulation subacute infarct  1-2 weeks prior to PTA New progressive L MCA and ACA infarcts   Resultant  R sided weakness, AMS, respiratory failure  MRI head 10/21/15 - right MCA infarct   MRI head 10/22/16 - Progressive b/l anterior circulation infarcts, right MCA extension and L MCA, ACA and MCA/ACA infarct.  Repeat MRI 10/30/16 - progressive extension of right MCA, ACA infarcts - could be the cause of worsening mental status vs. Transient hypotension post intubation  CTA head and neck - right ICA occlusion at origin and left ICA occlusion at siphon with bilateral distal reconstitution from PCOM.  Cerebral angio - right ICA string sign proximally, b/l ICA occluded at cavernous portion, right VA 85% stenosis  2D Echo - EF 65 - 70%. No cardiac source of emboli identified.  LDL 90  HgbA1c 8.5  Lovenox for VTE prophylaxis Diet NPO time specified  No antithrombotic prior to admission, now on aspirin 325 mg daily. plavix on hold for potential incoming procedures  On provigil now for lethagy  Ongoing aggressive stroke risk factor management  Therapy recommendations: Pending  Disposition:  pending (lives w/ wife, works  as a [employer] examiner)  Bilateral severe carotid disease  Could be associated with long term uncontrolled DM, HTN and smoking  Cerebra angio as above  Has plan for right VA stent in the future if pt has reasonable recovery  BP goal 140-160  Avoid hypotension  Fever   Leukocytosis 14.7-11.6-14.8-17.8  BCx pending - 1/4 growing staph - but could be contamination  Sputum culture pending  UA 10/28/16 negative  CXR no acute penumonia  On zosyn  Will need LP today to rule out CNS infection. Currently lovenox and ASA on hold. Discussed with Dr. Titus Mould.   ? Seizure   Off valproic acid due to high ammonia.  On keppra 500mg  bid  EEG diffuse slowing   Repeat EEG today  Close monitoring as pt is on provigil now  Hyperammonemia  Ammonia level  57-74-50  Currently off lactulose  Ammonia level today pending  Hypertension       Permissive hypertension (OK if < 220/120)   Long-term BP goal 140-160  Avoid hypotension  Hyperlipidemia  Home meds:  lipitor 10  Lipitor increased to 80 in hospital  LDL 90, goal <  43  Continue statin at discharge  Diabetes  HgbA1c 8.5  CBG still high  SSI  On lantus at home  on insulin drip for hyperglycemia currently   Alcohol Abuse  Heavy drinker per wife  Recent fall on 11/9 after birthday party  [place] again last week  Off CIWA protocol   Dysphagia  Secondary to stroke  Plan for PEG along with trach if needed  Tobacco abuse  Current smoker  Smoking cessation counseling will be provided  Other Stroke Risk Factors  Overweight, Body mass index is 28.89 kg/m., recommend weight loss, diet and exercise as appropriate  Obstructive sleep apnea, inconsistent use of CPAP at home.   Other Problems    This patient is critically ill due to bilateral stroke, bilateral carotid occlusion, right VA stenosis, fever, seizure, hyperammonemia and at significant risk of neurological worsening, death form  recurrent stroke, hemorrhagic transformation, status epilepticus, sepsis, shock and brain death. This patient's care requires constant monitoring of vital signs, hemodynamics, respiratory and cardiac monitoring, review of multiple databases, neurological assessment, discussion with family, other specialists and medical decision making of high complexity. I spent 45 minutes of neurocritical care time in the care of this patient. I again had long discussion with wife and father at bedside, updated pt current condition, further work up, treatment options and prognosis.   Rosalin Hawking, MD PhD Stroke Neurology 10/31/2016 10:20 AM

## 2016-10-31 NOTE — Progress Notes (Signed)
Pharmacy Antibiotic Note  Scott Harris is a 50 y.o. male admitted on 10/02/2016 with BBB coverage.  Pharmacy has been consulted for ceftazidime and vancomycin dosing. Pt had been on zosyn for possible aspiration pneumonia since 12/4.  Plan: Vancomycin 1500mg  IV load followed by Vancomycin 1g IV every 8 hours.  Goal trough 15-20 mcg/mL.  Ceftazidime 2g IV q8h Monitor culture data, renal function and clinical course VT at SS prn  Height: 5\' 8"  (172.7 cm) Weight: 190 lb 0.6 oz (86.2 kg) IBW/kg (Calculated) : 68.4  Temp (24hrs), Avg:100.7 F (38.2 C), Min:98.6 F (37 C), Max:103.5 F (39.7 C)   Recent Labs Lab 10/27/16 0500 10/28/16 0503 10/28/16 1440 10/28/16 1632 10/29/16 0518 10/29/16 1245 10/30/16 0606 10/31/16 0252  WBC 14.7* 15.0* 11.6*  --  14.8*  --  17.8*  --   CREATININE 0.70  --  0.69  --   --  0.84 0.85 0.69  LATICACIDVEN  --   --  0.9 0.9  --   --   --   --     Estimated Creatinine Clearance: 120.6 mL/min (by C-G formula based on SCr of 0.69 mg/dL).    No Known Allergies  Antimicrobials this admission: Zosyn 12/2 >> 12/3  -  12/4 >>12/5 Ceftaz 12/5>> Vanc 12/5>>  Dose adjustments this admission: n/a  Microbiology results: 12/2 BCx: ngtd 12/3 BCID: Staph species  UCx:   12/4 TA: rare GPC in pairs   12/4 MRSA PCR: neg   Andrey Cota. Diona Foley, PharmD, BCPS Clinical Pharmacist 10/31/2016 12:14 PM

## 2016-10-31 NOTE — Progress Notes (Signed)
PT Cancellation Note  Patient Details Name: Scott Harris MRN: [id number] DOB: 22-Apr-1966   Cancelled Treatment:    Reason Eval/Treat Not Completed: Patient not medically ready Per nurse, may be appropriate for PT tomorrow. Will follow up.   Marguarite Arbour A Chanel Mckesson 10/31/2016, 2:16 PM Wray Kearns, [place], DPT [phone]

## 2016-10-31 NOTE — Procedures (Signed)
ELECTROENCEPHALOGRAM REPORT  Date of Study: 10/31/2016  Patient's Name: Jobanny Mavis MRN: [id number] Date of Birth: [birth year]  Referring Provider: Rosalin Hawking, MD  Clinical History: 50 year old man with bilateral strokes and worsening mental status.  Medications:  cefTAZidime (FORTAZ) 2 g in dextrose 5 % 50 mL IVPB   chlorhexidine gluconate (MEDLINE KIT) (PERIDEX) 0.12 % solution 15 mL   enoxaparin (LOVENOX) injection 40 mg   famotidine (PEPCID) IVPB 20 mg premix   feeding supplement (GLUCERNA 1.2 CAL) liquid 1,000 mL   feeding supplement (PRO-STAT SUGAR FREE 64) liquid 30 mL   fentaNYL (SUBLIMAZE) injection 100 mcg   fentaNYL (SUBLIMAZE) injection 249 mcg   folic acid injection 1 mg   hydrALAZINE (APRESOLINE) injection 10 mg   insulin regular (NOVOLIN R,HUMULIN R) 250 Units in sodium chloride 0.9 % 250 mL (1 Units/mL) infusion   levETIRAcetam (KEPPRA) 100 MG/ML solution 500 mg   MEDLINE mouth rinse   midazolam (VERSED) injection 2 mg   midazolam (VERSED) injection 2 mg   modafinil (PROVIGIL) tablet 100 mg  L2 thiamine (VITAMIN B-1) tablet 100 mg  L3 vancomycin (VANCOCIN) 1,500 mg in sodium chloride 0.9 % 500 mL IVPB  L3 vancomycin (VANCOCIN) IVPB 1000 mg/200 mL premix  Technical Summary: A multichannel digital EEG recording measured by the international 10-20 system with electrodes applied with paste and impedances below 5000 ohms performed as portable with EKG monitoring in an encephalopathic patient who is ventilated but not on continuous sedation.  Hyperventilation and photic stimulation were not performed.  The digital EEG was referentially recorded, reformatted, and digitally filtered in a variety of bipolar and referential montages for optimal display.   Description: The patient is intubated but not sedated during the recording.  There is a symmetric low-voltage 1-2 Hz delta admixed with 4-5 Hz theta slowing and no discernible posterior dominant rhythm.  Patient responded to  noxious stimuli.  There were no epileptiform discharges or electrographic seizures seen.    EKG lead was unremarkable.  Impression: This EEG is abnormal due to markedly diffuse background slowing.  Clinical Correlation of the above findings indicates severe diffuse cerebral dysfunction that is non-specific in etiology and can be seen with hypoxic/ischemic injury, toxic/metabolic encephalopathies, neurodegenerative disorders, or medication effect.  The absence of epileptiform discharges does not rule out a clinical diagnosis of epilepsy.  Clinical correlation is advised.  Metta Clines, DO

## 2016-11-01 ENCOUNTER — Inpatient Hospital Stay (HOSPITAL_COMMUNITY): Payer: 59

## 2016-11-01 LAB — BASIC METABOLIC PANEL
Anion gap: 7 (ref 5–15)
BUN: 17 mg/dL (ref 6–20)
CO2: 29 mmol/L (ref 22–32)
CREATININE: 0.64 mg/dL (ref 0.61–1.24)
Calcium: 8.8 mg/dL — ABNORMAL LOW (ref 8.9–10.3)
Chloride: 107 mmol/L (ref 101–111)
GFR calc Af Amer: >60 mL/min (ref >60)
GLUCOSE: 199 mg/dL — AB (ref 65–99)
POTASSIUM: 3.4 mmol/L — AB (ref 3.5–5.1)
SODIUM: 143 mmol/L (ref 135–145)

## 2016-11-01 LAB — GLUCOSE, CAPILLARY
GLUCOSE-CAPILLARY: 109 mg/dL — AB (ref 65–99)
GLUCOSE-CAPILLARY: 131 mg/dL — AB (ref 65–99)
GLUCOSE-CAPILLARY: 134 mg/dL — AB (ref 65–99)
GLUCOSE-CAPILLARY: 147 mg/dL — AB (ref 65–99)
GLUCOSE-CAPILLARY: 158 mg/dL — AB (ref 65–99)
GLUCOSE-CAPILLARY: 162 mg/dL — AB (ref 65–99)
GLUCOSE-CAPILLARY: 167 mg/dL — AB (ref 65–99)
GLUCOSE-CAPILLARY: 179 mg/dL — AB (ref 65–99)
GLUCOSE-CAPILLARY: 185 mg/dL — AB (ref 65–99)
GLUCOSE-CAPILLARY: 190 mg/dL — AB (ref 65–99)
Glucose-Capillary: 189 mg/dL — ABNORMAL HIGH (ref 65–99)
Glucose-Capillary: 198 mg/dL — ABNORMAL HIGH (ref 65–99)
Glucose-Capillary: 168 mg/dL — ABNORMAL HIGH (ref 65–99)
Glucose-Capillary: 171 mg/dL — ABNORMAL HIGH (ref 65–99)
Glucose-Capillary: 175 mg/dL — ABNORMAL HIGH (ref 65–99)
Glucose-Capillary: 145 mg/dL — ABNORMAL HIGH (ref 65–99)
Glucose-Capillary: 126 mg/dL — ABNORMAL HIGH (ref 65–99)
Glucose-Capillary: 167 mg/dL — ABNORMAL HIGH (ref 65–99)
Glucose-Capillary: 137 mg/dL — ABNORMAL HIGH (ref 65–99)
Glucose-Capillary: 179 mg/dL — ABNORMAL HIGH (ref 65–99)
Glucose-Capillary: 180 mg/dL — ABNORMAL HIGH (ref 65–99)

## 2016-11-01 LAB — CULTURE, BLOOD (ROUTINE X 2)

## 2016-11-01 LAB — BLOOD GAS, ARTERIAL
Acid-Base Excess: 3.3 mmol/L — ABNORMAL HIGH (ref 0.0–2.0)
BICARBONATE: 28.1 mmol/L — AB (ref 20.0–28.0)
DRAWN BY: 225631
FIO2: 40
LHR: 16 {breaths}/min
MECHVT: 0.57 mL
O2 Saturation: 98.4 %
PEEP: 5 cmH2O
PO2 ART: 134 mmHg — AB (ref 83.0–108.0)
Patient temperature: 98.6
pCO2 arterial: 48.5 mmHg — ABNORMAL HIGH (ref 32.0–48.0)
pH, Arterial: 7.38 (ref 7.350–7.450)

## 2016-11-01 LAB — CBC
HEMATOCRIT: 40.2 % (ref 39.0–52.0)
Hemoglobin: 13 g/dL (ref 13.0–17.0)
MCH: 30.3 pg (ref 26.0–34.0)
MCHC: 32.3 g/dL (ref 30.0–36.0)
MCV: 93.7 fL (ref 78.0–100.0)
PLATELETS: 250 10*3/uL (ref 150–400)
RBC: 4.29 MIL/uL (ref 4.22–5.81)
RDW: 13 % (ref 11.5–15.5)
WBC: 15.6 10*3/uL — ABNORMAL HIGH (ref 4.0–10.5)

## 2016-11-01 LAB — PROTIME-INR
INR: 1.05
PROTHROMBIN TIME: 13.7 s (ref 11.4–15.2)

## 2016-11-01 LAB — PROCALCITONIN

## 2016-11-01 LAB — HIV ANTIBODY (ROUTINE TESTING W REFLEX): HIV Screen 4th Generation wRfx: NONREACTIVE

## 2016-11-01 LAB — HERPES SIMPLEX VIRUS(HSV) DNA BY PCR
HSV 1 DNA: NEGATIVE
HSV 2 DNA: NEGATIVE

## 2016-11-01 LAB — CULTURE, RESPIRATORY W GRAM STAIN: Culture: NORMAL

## 2016-11-01 LAB — CULTURE, RESPIRATORY

## 2016-11-01 MED ORDER — FENTANYL 2500MCG IN NS 250ML (10MCG/ML) PREMIX INFUSION
25.0000 ug/h | INTRAVENOUS | Status: DC
  Administered 2016-11-01: 50 ug/h via INTRAVENOUS
  Administered 2016-11-02: 100 ug/h via INTRAVENOUS
  Administered 2016-11-03: 50 ug/h via INTRAVENOUS
  Filled 2016-11-01 (×2): qty 250

## 2016-11-01 MED ORDER — POTASSIUM CHLORIDE 20 MEQ/15ML (10%) PO SOLN
40.0000 meq | Freq: Once | ORAL | Status: AC
  Administered 2016-11-01: 40 meq
  Filled 2016-11-01: qty 30

## 2016-11-01 MED ORDER — ENOXAPARIN SODIUM 40 MG/0.4ML ~~LOC~~ SOLN
40.0000 mg | SUBCUTANEOUS | Status: DC
  Administered 2016-11-01 – 2016-11-05 (×5): 40 mg via SUBCUTANEOUS
  Filled 2016-11-01 (×5): qty 0.4

## 2016-11-01 NOTE — Progress Notes (Signed)
OT Sign off Note  Patient Details Name: Scott Harris MRN: [id number] DOB: 08/18/1966   Cancelled Treatment:    Reason Eval/Treat Not Completed: Patient not medically ready RN requesting therapist to sign off and await new orders  Vonita Moss   OTR/L Pager: [phone] Office: [phone] .  11/01/2016, 10:05 AM

## 2016-11-01 NOTE — Progress Notes (Addendum)
PULMONARY / CRITICAL CARE MEDICINE   Name: Scott Harris MRN: [id number] DOB: November 10, 1966    ADMISSION DATE:  10/20/2016 CONSULTATION DATE:  10/30/16  REFERRING MD:  Claris Gower  CHIEF COMPLAINT:  Acute respiratory failure, altered MS  HISTORY OF PRESENT ILLNESS:   50 yo man, hx EtOH abuse, HTN, DM, hyperlipidemia, admitted 11/25 with slurred speech, confusion, lethargy. 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 CTA neck and then IR angiogram > severe B carotid disease. It was hoped he would be candidate for CIR after PEG placement for dysphagia. He has experienced progressive altered MS and lethargy since 12/2. A repeat Head CT 12/2 did not show new bleed or edema. No evidence seizure activity noted. Ammonia was slightly elevated and empiric lactulose given.   SUBJECTIVE:  Continues to bite tube, NSC in neuro status  VITAL SIGNS: BP (!) 143/88   Pulse 73   Temp 100 F (37.8 C) (Rectal)   Resp 15   Ht 5\' 8"  (1.727 m)   Wt 192 lb 14.4 oz (87.5 kg)   SpO2 99%   BMI 29.33 kg/m   HEMODYNAMICS:    VENTILATOR SETTINGS: Vent Mode: PRVC FiO2 (%):  [40 %] 40 % Set Rate:  [16 bmp] 16 bmp Vt Set:  [570 mL] 570 mL PEEP:  [5 cmH20] 5 cmH20 Plateau Pressure:  [16 cmH20-21 cmH20] 21 cmH20  INTAKE / OUTPUT: I/O last 3 completed shifts: In: 6035.7 [I.V.:1947.5; NG/GT:3838.2; IV Piggyback:250] Out: [id number] [Urine:1070]  PHYSICAL EXAMINATION: General: Sedated, intubated , left gaze preferenxce Neuro:  Biting tube, No follows commands HEENT:  Pupils equal and react Cardiovascular:  Tachycardic, no M, regular Lungs:  Coarse Bil Abdomen:  Soft, hypoactive bowel sounds.  Musculoskeletal:  No edema, no deformities Skin:  warm  LABS:  BMET  Recent Labs Lab 10/30/16 0606 10/31/16 0252 11/01/16 0210  NA 139 141 143  K 4.7 4.1 3.4*  CL 107 105 107  CO2 23 27 29   BUN 20 19 17   CREATININE 0.85 0.69 0.64  GLUCOSE 289* 229* 199*    Electrolytes  Recent Labs Lab 10/30/16 0606  10/31/16 0252 11/01/16 0210  CALCIUM 9.2 9.1 8.8*    CBC  Recent Labs Lab 10/29/16 0518 10/30/16 0606 11/01/16 0210  WBC 14.8* 17.8* 15.6*  HGB 16.4 15.8 13.0  HCT 46.8 46.7 40.2  PLT 298 290 250    Coag's  Recent Labs Lab 10/28/16 1440 11/01/16 0210  APTT 30  --   INR 0.98 1.05    Sepsis Markers  Recent Labs Lab 10/28/16 1440 10/28/16 1632 10/30/16 1006 10/31/16 0252 11/01/16 0210  LATICACIDVEN 0.9 0.9  --   --   --   PROCALCITON <0.10  --  0.24 <0.10 <0.10    ABG  Recent Labs Lab 10/30/16 0915 10/31/16 0416 11/01/16 0500  PHART 7.319* 7.420 7.380  PCO2ART 54.7* 42.6 48.5*  PO2ART 171* 110* 134*    Liver Enzymes  Recent Labs Lab 10/28/16 1440 10/30/16 0606  AST 20 27  ALT 33 35  ALKPHOS 75 71  BILITOT 0.5 0.8  ALBUMIN 3.2* 3.0*    Cardiac Enzymes No results for input(s): TROPONINI, PROBNP in the last 168 hours.  Glucose  Recent Labs Lab 11/01/16 0027 11/01/16 0127 11/01/16 0215 11/01/16 0334 11/01/16 0532 11/01/16 0623  GLUCAP 190* 189* 198* 168* 175* 171*    Imaging No results found.   STUDIES:  EEG 11/28 and 29 >> no seizure activity or focus. Diffuse slow activity MRI Brain >>  Cerebral  angiogram 11/27 >> B ICA stenosis, 100% R, 99% on L 12/4 MRI as noted 12/5 LP done  CULTURES: Blood 12/2 >>  Blood 12/4 >>  Sputum 12/5>> 12/5 LP>>>  ANTIBIOTICS: Zosyn 12/4 >> 12/5 Ceftaz 12/5>> vanc 12/5>>  SIGNIFICANT EVENTS: 12/5 biting ETT, fevers  LINES/TUBES: ETT 12/4 >>   DISCUSSION: 50 yo man with HTN, DM, EtOH abuse experience d progressive lethargy and resp failure in setting B frontal CVA's. Course has been consistent with progressive poor airway management. No clear evidence for PNA although he did have fever am 12/4. Intubated and empiric abx added. Needs repeat Ct head to insure no bleed, edema. Suspect that this is progression of this original insult, portends long recovery period to unclear plateau.  These factors may make early trach / PEG reasonable. Guadalupe Guerra. Still bites tube.  ASSESSMENT / PLAN:  PULMONARY A: Acute resp failure due to poor airway protection, neurological process Consider aspiration event / PNA, but CXR and course not compelling for this. Fever noted 12/4 04:00 Hx tobacco use  P:   Intubated 12/4 am He may need trach due to brain injury that is progressive. Empiric abx started 12/4 am for possible aspiration event as below Based on his progressive lethargy, neuro injuries, suspect he needs early trach and PEG  NEUROLOGIC A:   Bilateral large frontal CVAs Progressive lethargy and dysphagia, likely due to above. Hyperammonemia was considered earlier in course At risk EtOH withdrawal No evidence seizure activity 12/5 biting ETT P:   RASS goal: -1 to 0 ASA 325mg  Lactulose was added 11/29, consider d/c based on wakeup and next ammonia Thiamine + folate  keppra bid Modafinil  Add intermittent fentanyl + versed for sedation 12/4 MRI brain reveals similar but with ? hemorrhage ? Dc asa 12/6  CARDIOVASCULAR A:  HTN Hyperlipidemia P:  BP control goal SBP  140 - 170; hydralazine prn Statin ordered  KVO IVF 12/4  RENAL  Recent Labs Lab 10/30/16 0606 10/31/16 0252 11/01/16 0210  K 4.7 4.1 3.4*     A:   No acute issues P:   Follow BMP intermittently Replete electrolytes as indicated.   GASTROINTESTINAL A:   SUP Nutrition  P:   protonix > changed to pepcid 12/4 am Glucerna TF  HEMATOLOGIC A:    DVT prophylaxis P:  On enoxaparin   INFECTIOUS A:   Fever Possible aspiration PNA bc with staph P:   Empiric zosyn added 12/4, changed to ceftaz and vanc added 12/5. Consider dc vanc 12/6 Check resp cx and CXR, d/c abx if inconsistent w HCAP / asp PNA  ENDOCRINE CBG (last 3)   Recent Labs  11/01/16 0334 11/01/16 0532 11/01/16 0623  GLUCAP 168* 175* 171*     A:   DM P:   lantus + SSI    FAMILY  - Updates: Updated wife at  bedside am 12/5  - Inter-disciplinary family meet or Palliative Care meeting due by:  11/06/16   30 min->CCT    Richardson Landry Minor ACNP Maryanna Shape PCCM Pager [phone] till 3 pm If no answer page [phone] 11/01/2016, 7:29 AM  STAFF NOTE: I, Merrie Roof, MD FACP have personally reviewed patient's available data, including medical history, events of note, physical examination and test results as part of my evaluation. I have discussed with resident/NP and other care providers such as pharmacist, RN and RRT. In addition, I personally evaluated patient and elicited key findings of: ronchi, coughing, limited but increasing edema, pcxr small at left base,  no progress neuro wise, neuro concerning about his functional recovery, will discus trach option for possible Friday pending family wishes, WUA, fever has been noted, LP neg bactrial process, dc vanc, likley to dc ceftaz soon, follow fever curve, feeding, d/w neuro, wean, unable to extubate with neurostatus, using liited int sedation The patient is critically ill with multiple organ systems failure and requires high complexity decision making for assessment and support, frequent evaluation and titration of therapies, application of advanced monitoring technologies and extensive interpretation of multiple databases.   Critical Care Time devoted to patient care services described in this note is 30 Minutes. This time reflects time of care of this signee: Merrie Roof, MD FACP. This critical care time does not reflect procedure time, or teaching time or supervisory time of PA/NP/Med student/Med Resident etc but could involve care discussion time. Rest per NP/medical resident whose note is outlined above and that I agree with   Lavon Paganini. Titus Mould, MD, [place] Pgr: [facility] 11/01/2016 11:04 AM

## 2016-11-01 NOTE — Progress Notes (Signed)
   11/01/16 1200  Clinical Encounter Type  Visited With Patient and family together;Health care provider  Visit Type Initial;Psychological support;Spiritual support;Social support  Referral From Nurse  Spiritual Encounters  Spiritual Needs Prayer;Emotional  Stress Factors  Patient Stress Factors None identified;Health changes  Family Stress Factors Exhausted   Chaplain was paged to give emotional support to family. Chaplain spoke to wife and provided some spiritual care via prayer.

## 2016-11-01 NOTE — Progress Notes (Signed)
Received call from [place] NP stating that Dr. Titus Mould DOES NOT want PEG procedure at this time. I was told to restart the tube feeds and glucose stabilizer. This was called to IR, Jannifer Franklin was paged.

## 2016-11-01 NOTE — Progress Notes (Signed)
STROKE TEAM PROGRESS NOTE   SUBJECTIVE (INTERVAL HISTORY) His wife is at the bedside. Pt slightly more responsive than yesterday. Able to open eyes on pain and turned his head to his wife on calling. However, developed violent cough and gag, had to put on sedation for ventilation. LP done and did not show CNS infection. EEG no seizure.   OBJECTIVE Temp:  [98.6 F (37 C)-101.5 F (38.6 C)] 100 F (37.8 C) (12/06 0000) Pulse Rate:  [65-93] 72 (12/06 0900) Cardiac Rhythm: Normal sinus rhythm (12/06 0600) Resp:  [14-17] 16 (12/06 0900) BP: (126-172)/(82-98) 138/87 (12/06 0900) SpO2:  [95 %-100 %] 100 % (12/06 0900) FiO2 (%):  [40 %] 40 % (12/06 0749) Weight:  [192 lb 14.4 oz (87.5 kg)] 192 lb 14.4 oz (87.5 kg) (12/06 0436)  CBC:   Recent Labs Lab 10/28/16 1440  10/30/16 0606 11/01/16 0210  WBC 11.6*  < > 17.8* 15.6*  NEUTROABS 8.4*  --   --   --   HGB 16.3  < > 15.8 13.0  HCT 45.4  < > 46.7 40.2  MCV 88.3  < > 91.0 93.7  PLT 251  < > 290 250  < > = values in this interval not displayed.  Basic Metabolic Panel:   Recent Labs Lab 10/31/16 0252 11/01/16 0210  NA 141 143  K 4.1 3.4*  CL 105 107  CO2 27 29  GLUCOSE 229* 199*  BUN 19 17  CREATININE 0.69 0.64  CALCIUM 9.1 8.8*    Lipid Panel:     Component Value Date/Time   CHOL 172 10/22/2016 1425   TRIG 157 (H) 10/22/2016 1425   HDL 51 10/22/2016 1425   CHOLHDL 3.4 10/22/2016 1425   VLDL 31 10/22/2016 1425   LDLCALC 90 10/22/2016 1425   HgbA1c:  Lab Results  Component Value Date   HGBA1C 8.5 (H) 10/22/2016   Urine Drug Screen:     Component Value Date/Time   LABOPIA NONE DETECTED 10/20/2016 1437   COCAINSCRNUR NONE DETECTED 10/20/2016 1437   LABBENZ NONE DETECTED 10/20/2016 1437   AMPHETMU NONE DETECTED 10/20/2016 1437   THCU NONE DETECTED 10/20/2016 1437   LABBARB NONE DETECTED 10/20/2016 1437      IMAGING I have personally reviewed the radiological images below and agree with the radiology  interpretations.  MRI 10/20/16 1. Area of diffusion restriction and hyperintense T2 weighted signal within the right frontal white matter. The distribution is not typical of ischemia and the appearance is concerning for a neoplastic process, such as a low grade glioma. Tumefactive demyelinating disease is also a consideration, though if the patient's symptoms are truly acute, some degree of contrast enhancement would be expected. Short-term follow-up with brain MRI with and without contrast in 4-6 weeks is recommended, as change over time may be helpful in diagnosis. Additionally, CSF sampling should be considered. 2. No hemorrhage or mass effect.  MRI 10/22/16 1. Emergent Anterior Circulation Large Vessel Occlusions - both Internal Carotid Arteries likely affected. Progressive bilateral anterior circulation infarcts, newly involving the left MCA/ACA territories. 2. No associated hemorrhage or mass effect at this time. 3. This was discussed by telephone with Dr. Elpidio Anis On 10/22/2016 at 1037 hours. We are arranging for the patient to undergo stat CTA head and neck at the time of this dictation.  CT Head Wo Contrast 10/25/2016 Low-density in the known infarctions affecting the right frontal deep white matter in the left frontal cortical and subcortical brain. No sign of hemorrhagic transformation  or significant mass effect/shift. The seems to be a little more involvement in the left basal ganglia by today's CT.  CTA Head and Neck 10/22/2016 1. Functional occlusion of both ICA siphons with suboptimal reconstitution of flow at both ICA termini: Radiographic string sign stenosis at the right ICA origin in the neck related to bulky low-density plaque or thrombus.  The cervical Right ICA demonstrates thread-like enhancement and appears to occlude at the skullbase.  Suspected much of the Right ICA siphon is fully occluded. Tapered appearance of the cervical Left ICA without focal stenosis  in the neck. There is thread-like enhancement in the Left ICA siphon, but suspected the supraclinoid Left ICA segment is fully occluded.  Thus the tapering in the neck is likely do to distal outflow obstruction. Reconstituted flow from the posterior communicating arteries, which unfortunately are diminutive. No ACA or MCA occlusion identified, but the anterior circulation appears diminutive throughout. 2. Preliminary report of the above was discussed with Dr. Elpidio Anis at 1138 hours. 3. Posterior circulation is largely normal. There does appear to be a moderate or severe atherosclerotic stenosis at the right vertebral artery origin, but no other posterior circulation stenosis. 4. Expected CT appearance of evolving bilateral anterior circulation infarcts. No associated hemorrhage or mass effect. 5. No other abnormality in the neck. Mild emphysema in the lung apices.  Cerebral Angiogram 10/23/2016 Angiographically occluded internal carotid arteries in the cavernous segments at the level of the ophthalmic arteries bilaterally, without reconstitution from the external carotid artery branches. Pre occlusive string sign of the right internal carotid artery proximally extending to the occlusion in the cavernous segment as described above. Approximally 70% stenoses of the left internal carotid artery at the bulb again with a diminutive left internal carotid artery distal to this to its occluded cavernous segment. Approximately 85-90% stenoses at the origin of the right vertebral artery. Retrograde opacification of the anterior cerebral artery distribution bilaterally from the P3 leptomeningeal branches, and from the thalamic perforators from the P2 segment of the posterior cerebral arteries. Occluded pericallosal and callosal marginal branches of the anterior cerebral arteries at the level of the head of the corpus callosum.  CT Head Wo Contrast 10/28/2016 Expected evolution of left ACA and right MCA  territory infarcts without evidence of hemorrhagic transformation, new infarct or other new acute intracranial abnormality.  EEG   Diffuse slow activity, max over left temporal regions some improvement over the course of the recording.   EEG 10/31/16 This EEG is abnormal due to markedly diffuse background slowing. Clinical Correlation of the above findings indicates severe diffuse cerebral dysfunction that is non-specific in etiology and can be seen with hypoxic/ischemic injury, toxic/metabolic encephalopathies, neurodegenerative disorders, or medication effect.  The absence of epileptiform discharges does not rule out a clinical diagnosis of epilepsy.  Clinical correlation is advised.  Dg Chest Port 1 View 11/01/2016 IMPRESSION: 1. Little change in bibasilar atelectasis left-greater-than-right. 2. Endotracheal tube tip 5.1 cm above the carina.  Mr Brain Limited Wo Contrast 10/30/2016 IMPRESSION: Subacute moderate-size right frontal lobe subcortical infarct. Overall size relatively similar prior exam. Compared with 10/22/2016 MR, progressive enlargement of large left frontal lobe infarct now involving portions of the left caudate head and lenticular nucleus. Small area of extension to the left sub insular region. Local mass effect. Gradient and T1 imaging was not performed however, findings are suspicious for the presence of small amount of hemorrhage with these infarcts. Occluded internal carotid arteries with reconstitution of supraclinoid aspect which appears suboptimal given  the infarcts. Slow flow within middle cerebral artery branches bilaterally. Prominent opacification/ mucosal thickening paranasal sinuses most notable right maxillary sinus and ethmoid sinus air cells bilaterally.   PHYSICAL EXAM  Temp:  [98.6 F (37 C)-101.5 F (38.6 C)] 100 F (37.8 C) (12/06 0000) Pulse Rate:  [65-93] 72 (12/06 0900) Resp:  [14-17] 16 (12/06 0900) BP: (126-172)/(82-98) 138/87 (12/06 0900) SpO2:   [95 %-100 %] 100 % (12/06 0900) FiO2 (%):  [40 %] 40 % (12/06 0749) Weight:  [192 lb 14.4 oz (87.5 kg)] 192 lb 14.4 oz (87.5 kg) (12/06 0436)  General - Well nourished, well developed, intubated and no sedation.   Ophthalmologic - Fundi not visualized due to upward gaze and not able to position the ophthoscope.  Cardiovascular - Regular rhythm and rate  Neuro - intubated and on vent, not on sedation. Not open eyes on voice, but on pain stimulation, pt tries to open eyes and barely opened his eyes. PERRL, upward gaze with disconjugation of eyes, sluggish doll's eye present, positive corneal and gag. On pain stimulation, left LE 2/5 withdraw but other limbs minimal movement. DTR diminished and no babinski. Sensation, coordination and gait not tested.    ASSESSMENT/PLAN [name] is a 50 y.o. male with history of  newly diagnosed bilateral strokes, DM2, ETOH abuse HLD and HTN  presenting with confusion, L sided numbness and tingling x 2 weeks, neuro consulted for stroke and possible seizure.   Stroke:  Progressive bilateral anterior circulation infarcts in setting of bilateral ICA occlusion - Has uncontrolled DM II, HTN, alcohol and smoking R anterior circulation subacute infarct  1-2 weeks prior to PTA New progressive L MCA and ACA infarcts   Resultant  R sided weakness, AMS, respiratory failure  MRI head 10/21/15 - right MCA infarct   MRI head 10/22/16 - Progressive b/l anterior circulation infarcts, right MCA extension and L MCA, ACA and MCA/ACA infarct.  Repeat MRI 10/30/16 - progressive extension of right MCA, ACA infarcts - could be the cause of worsening mental status vs. Transient hypotension post intubation  CTA head and neck - right ICA occlusion at origin and left ICA occlusion at siphon with bilateral distal reconstitution from PCOM.  Cerebral angio - right ICA string sign proximally, b/l ICA occluded at cavernous portion, right VA 85% stenosis  2D Echo - EF 65 - 70%. No  cardiac source of emboli identified.  LDL 90  HgbA1c 8.5  Lovenox for VTE prophylaxis Diet NPO time specified Except for: Sips with Meds  No antithrombotic prior to admission, now on aspirin 325 mg daily. plavix on hold for potential incoming procedures  On provigil now for lethagy  Ongoing aggressive stroke risk factor management  Therapy recommendations: Pending  Disposition:  pending (lives w/ wife, works as a Patent examiner)  Had long discussion with wife at bedside. Prognosis is not optimistic. However, if family would like aggressive care, will need to consider trach and PEG earlier. Wife will discuss with pt father.   Bilateral severe carotid disease  Could be associated with long term uncontrolled DM, HTN and smoking  Cerebra angio as above  Has plan for right VA stent in the future if pt has reasonable recovery  BP goal 140-160  Avoid hypotension  Fever   Leukocytosis 14.7-11.6-14.8-17.8  BCx pending - 1/4 growing staph but coagulase negative - likely contamination  Sputum culture pending  UA 10/28/16 negative  CXR no acute penumonia  CSF no sign of CNS infection.  On  fortaz and vanco.  ? Seizure   Off valproic acid due to high ammonia.  On keppra 500mg  bid  EEG diffuse slowing   Repeat EEG diffuse slowing and no seizure  Hyperammonemia  Ammonia level  [phone]  Currently off lactulose  Ammonia level today pending  Hypertension       Permissive hypertension (OK if < 220/120)   Long-term BP goal 140-160  Avoid hypotension  Hyperlipidemia  Home meds:  lipitor 10  Lipitor increased to 80 in hospital  LDL 90, goal < 70  Continue statin at discharge  Diabetes  HgbA1c 8.5  CBG still high  SSI  On lantus at home  on insulin drip for hyperglycemia currently   Alcohol Abuse  Heavy drinker per wife  Recent fall on 11/9 after birthday party  [place] again last week  Off CIWA protocol   Dysphagia  Secondary  to stroke  Plan for PEG along with trach if needed  Tobacco abuse  Current smoker  Smoking cessation counseling will be provided  Other Stroke Risk Factors  Overweight, Body mass index is 29.33 kg/m., recommend weight loss, diet and exercise as appropriate  Obstructive sleep apnea, inconsistent use of CPAP at home.   Other Problems    This patient is critically ill due to bilateral stroke, bilateral carotid occlusion, right VA stenosis, fever, seizure, hyperammonemia and at significant risk of neurological worsening, death form recurrent stroke, hemorrhagic transformation, status epilepticus, sepsis, shock and brain death. This patient's care requires constant monitoring of vital signs, hemodynamics, respiratory and cardiac monitoring, review of multiple databases, neurological assessment, discussion with family, other specialists and medical decision making of high complexity. I spent 45 minutes of neurocritical care time in the care of this patient. I again had long discussion with wife at bedside, updated pt current condition, treatment options and possible prognosis. She needs to discuss with pt father.   Rosalin Hawking, MD PhD Stroke Neurology 11/01/2016 9:58 AM

## 2016-11-01 NOTE — Progress Notes (Signed)
[place] Progress Note Patient Name: Scott Harris DOB: 09-02-1966 MRN: [id number]   Date of Service  11/01/2016  HPI/Events of Note  agitated  eICU Interventions  Fentanyl drip     Intervention Category Major Interventions: Other:  Ziza Hastings 11/01/2016, 9:34 PM

## 2016-11-01 NOTE — Progress Notes (Signed)
PT Cancellation and Discharge Note  Patient Details Name: Scott Harris MRN: [id number] DOB: [birth year]   Cancelled Treatment:    Reason Eval/Treat Not Completed: Patient not medically ready.  Spoke with RN who indicates pt is not appropriate for PT at this time.  Will sign off and await new orders once appropriate for PT and mobility.     [name] [name] 11/01/2016, 9:10 AM

## 2016-11-01 NOTE — Progress Notes (Addendum)
VO from Jannifer Franklin to stop tube feeds for possible peg placement. VO from Sonterra Minor to stop insulin gtt since tube feeds will be off.

## 2016-11-01 NOTE — Progress Notes (Signed)
Rehab admissions - I am signing off for inpatient rehab at this time.  Patient lacks the support for a home discharge after a potential inpatient rehab stay.  Will need SNF placement from a rehab perspective.  [id number]

## 2016-11-01 NOTE — Progress Notes (Addendum)
Patient ID: Scott Harris, male   DOB: May 24, 1966, 50 y.o.   MRN: [id number]  Pt has been tentatively planned for percutaneous gastric tube in IR Now in ICU and on vent/intubated  Discussed with Dr Laurence Ferrari Would be best to await trach placement for G tube to be most safely and easily placed.  will keep on IR Radar and watch for trach placement  ADDENDUM:  RN calls to tell me that CCM wants to HOLD off for now on G tube Will cancel order Please re order if needed

## 2016-11-02 ENCOUNTER — Inpatient Hospital Stay (HOSPITAL_COMMUNITY): Payer: 59

## 2016-11-02 LAB — BASIC METABOLIC PANEL
ANION GAP: 6 (ref 5–15)
BUN: 14 mg/dL (ref 6–20)
CHLORIDE: 109 mmol/L (ref 101–111)
CO2: 27 mmol/L (ref 22–32)
Calcium: 8.5 mg/dL — ABNORMAL LOW (ref 8.9–10.3)
Creatinine, Ser: 0.62 mg/dL (ref 0.61–1.24)
GFR calc Af Amer: >60 mL/min (ref >60)
GLUCOSE: 161 mg/dL — AB (ref 65–99)
POTASSIUM: 3.8 mmol/L (ref 3.5–5.1)
SODIUM: 142 mmol/L (ref 135–145)

## 2016-11-02 LAB — CULTURE, BLOOD (ROUTINE X 2)
Culture: NO GROWTH
Culture: NO GROWTH

## 2016-11-02 LAB — GLUCOSE, CAPILLARY
GLUCOSE-CAPILLARY: 121 mg/dL — AB (ref 65–99)
GLUCOSE-CAPILLARY: 125 mg/dL — AB (ref 65–99)
GLUCOSE-CAPILLARY: 134 mg/dL — AB (ref 65–99)
GLUCOSE-CAPILLARY: 145 mg/dL — AB (ref 65–99)
GLUCOSE-CAPILLARY: 150 mg/dL — AB (ref 65–99)
GLUCOSE-CAPILLARY: 167 mg/dL — AB (ref 65–99)
GLUCOSE-CAPILLARY: 171 mg/dL — AB (ref 65–99)
GLUCOSE-CAPILLARY: 173 mg/dL — AB (ref 65–99)
GLUCOSE-CAPILLARY: 181 mg/dL — AB (ref 65–99)
GLUCOSE-CAPILLARY: 193 mg/dL — AB (ref 65–99)
GLUCOSE-CAPILLARY: 220 mg/dL — AB (ref 65–99)
GLUCOSE-CAPILLARY: 232 mg/dL — AB (ref 65–99)
Glucose-Capillary: 142 mg/dL — ABNORMAL HIGH (ref 65–99)
Glucose-Capillary: 155 mg/dL — ABNORMAL HIGH (ref 65–99)

## 2016-11-02 LAB — CBC
HEMATOCRIT: 34.6 % — AB (ref 39.0–52.0)
HEMOGLOBIN: 11.4 g/dL — AB (ref 13.0–17.0)
MCH: 30.4 pg (ref 26.0–34.0)
MCHC: 32.9 g/dL (ref 30.0–36.0)
MCV: 92.3 fL (ref 78.0–100.0)
Platelets: 237 10*3/uL (ref 150–400)
RBC: 3.75 MIL/uL — ABNORMAL LOW (ref 4.22–5.81)
RDW: 12.6 % (ref 11.5–15.5)
WBC: 11.8 10*3/uL — AB (ref 4.0–10.5)

## 2016-11-02 LAB — AMMONIA: Ammonia: 45 umol/L — ABNORMAL HIGH (ref 9–35)

## 2016-11-02 MED ORDER — INSULIN ASPART 100 UNIT/ML ~~LOC~~ SOLN
3.0000 [IU] | SUBCUTANEOUS | Status: DC
  Administered 2016-11-02 – 2016-11-05 (×17): 3 [IU] via SUBCUTANEOUS

## 2016-11-02 MED ORDER — POLYETHYLENE GLYCOL 3350 17 G PO PACK
17.0000 g | PACK | Freq: Every day | ORAL | Status: DC
  Administered 2016-11-02 – 2016-11-03 (×2): 17 g via ORAL
  Filled 2016-11-02 (×2): qty 1

## 2016-11-02 MED ORDER — INSULIN ASPART 100 UNIT/ML ~~LOC~~ SOLN
0.0000 [IU] | SUBCUTANEOUS | Status: DC
  Administered 2016-11-02 – 2016-11-03 (×3): 5 [IU] via SUBCUTANEOUS
  Administered 2016-11-03: 2 [IU] via SUBCUTANEOUS
  Administered 2016-11-03: 5 [IU] via SUBCUTANEOUS
  Administered 2016-11-03: 2 [IU] via SUBCUTANEOUS
  Administered 2016-11-03: 3 [IU] via SUBCUTANEOUS
  Administered 2016-11-03 (×2): 5 [IU] via SUBCUTANEOUS
  Administered 2016-11-04: 3 [IU] via SUBCUTANEOUS
  Administered 2016-11-04: 11 [IU] via SUBCUTANEOUS
  Administered 2016-11-04 (×4): 5 [IU] via SUBCUTANEOUS

## 2016-11-02 MED ORDER — INSULIN GLARGINE 100 UNIT/ML ~~LOC~~ SOLN
10.0000 [IU] | Freq: Every day | SUBCUTANEOUS | Status: DC
  Administered 2016-11-02 – 2016-11-04 (×3): 10 [IU] via SUBCUTANEOUS
  Filled 2016-11-02 (×3): qty 0.1

## 2016-11-02 MED ORDER — LACTULOSE 10 GM/15ML PO SOLN
30.0000 g | Freq: Every day | ORAL | Status: DC
  Administered 2016-11-02 – 2016-11-03 (×2): 30 g
  Filled 2016-11-02 (×2): qty 45

## 2016-11-02 MED ORDER — SODIUM CHLORIDE 0.9 % IV BOLUS (SEPSIS)
500.0000 mL | Freq: Once | INTRAVENOUS | Status: AC
Start: 2016-11-02 — End: 2016-11-02
  Administered 2016-11-02: 500 mL via INTRAVENOUS

## 2016-11-02 NOTE — Progress Notes (Signed)
STROKE TEAM PROGRESS NOTE   SUBJECTIVE (INTERVAL HISTORY) His wife is at the bedside. No neuro changes from yesterday. Off fentanyl this am but on stimulation, continued to cough and gag. Tolerated weaning for 4-5 hours yesterday but today showed apnea during weaning, now back to vent.    OBJECTIVE Temp:  [97.1 F (36.2 C)-101.1 F (38.4 C)] 100.6 F (38.1 C) (12/07 0900) Pulse Rate:  [63-93] 74 (12/07 1000) Cardiac Rhythm: Normal sinus rhythm (12/07 0800) Resp:  [10-18] 16 (12/07 1000) BP: (108-163)/(64-91) 117/68 (12/07 1000) SpO2:  [94 %-100 %] 100 % (12/07 1000) FiO2 (%):  [40 %] 40 % (12/07 1155) Weight:  [196 lb 13.9 oz (89.3 kg)] 196 lb 13.9 oz (89.3 kg) (12/07 0500)  CBC:   Recent Labs Lab 10/28/16 1440  11/01/16 0210 11/02/16 0315  WBC 11.6*  < > 15.6* 11.8*  NEUTROABS 8.4*  --   --   --   HGB 16.3  < > 13.0 11.4*  HCT 45.4  < > 40.2 34.6*  MCV 88.3  < > 93.7 92.3  PLT 251  < > 250 237  < > = values in this interval not displayed.  Basic Metabolic Panel:   Recent Labs Lab 11/01/16 0210 11/02/16 0315  NA 143 142  K 3.4* 3.8  CL 107 109  CO2 29 27  GLUCOSE 199* 161*  BUN 17 14  CREATININE 0.64 0.62  CALCIUM 8.8* 8.5*    Lipid Panel:     Component Value Date/Time   CHOL 172 10/22/2016 1425   TRIG 157 (H) 10/22/2016 1425   HDL 51 10/22/2016 1425   CHOLHDL 3.4 10/22/2016 1425   VLDL 31 10/22/2016 1425   LDLCALC 90 10/22/2016 1425   HgbA1c:  Lab Results  Component Value Date   HGBA1C 8.5 (H) 10/22/2016   Urine Drug Screen:     Component Value Date/Time   LABOPIA NONE DETECTED 10/20/2016 1437   COCAINSCRNUR NONE DETECTED 10/20/2016 1437   LABBENZ NONE DETECTED 10/20/2016 1437   AMPHETMU NONE DETECTED 10/20/2016 1437   THCU NONE DETECTED 10/20/2016 1437   LABBARB NONE DETECTED 10/20/2016 1437      IMAGING I have personally reviewed the radiological images below and agree with the radiology interpretations.  MRI 10/20/16 1. Area of  diffusion restriction and hyperintense T2 weighted signal within the right frontal white matter. The distribution is not typical of ischemia and the appearance is concerning for a neoplastic process, such as a low grade glioma. Tumefactive demyelinating disease is also a consideration, though if the patient's symptoms are truly acute, some degree of contrast enhancement would be expected. Short-term follow-up with brain MRI with and without contrast in 4-6 weeks is recommended, as change over time may be helpful in diagnosis. Additionally, CSF sampling should be considered. 2. No hemorrhage or mass effect.  MRI 10/22/16 1. Emergent Anterior Circulation Large Vessel Occlusions - both Internal Carotid Arteries likely affected. Progressive bilateral anterior circulation infarcts, newly involving the left MCA/ACA territories. 2. No associated hemorrhage or mass effect at this time. 3. This was discussed by telephone with Dr. Elpidio Anis On 10/22/2016 at 1037 hours. We are arranging for the patient to undergo stat CTA head and neck at the time of this dictation.  CT Head Wo Contrast 10/25/2016 Low-density in the known infarctions affecting the right frontal deep white matter in the left frontal cortical and subcortical brain. No sign of hemorrhagic transformation or significant mass effect/shift. The seems to be a little more  involvement in the left basal ganglia by today's CT.  CTA Head and Neck 10/22/2016 1. Functional occlusion of both ICA siphons with suboptimal reconstitution of flow at both ICA termini: Radiographic string sign stenosis at the right ICA origin in the neck related to bulky low-density plaque or thrombus.  The cervical Right ICA demonstrates thread-like enhancement and appears to occlude at the skullbase.  Suspected much of the Right ICA siphon is fully occluded. Tapered appearance of the cervical Left ICA without focal stenosis in the neck. There is thread-like  enhancement in the Left ICA siphon, but suspected the supraclinoid Left ICA segment is fully occluded.  Thus the tapering in the neck is likely do to distal outflow obstruction. Reconstituted flow from the posterior communicating arteries, which unfortunately are diminutive. No ACA or MCA occlusion identified, but the anterior circulation appears diminutive throughout. 2. Preliminary report of the above was discussed with Dr. Elpidio Anis at 1138 hours. 3. Posterior circulation is largely normal. There does appear to be a moderate or severe atherosclerotic stenosis at the right vertebral artery origin, but no other posterior circulation stenosis. 4. Expected CT appearance of evolving bilateral anterior circulation infarcts. No associated hemorrhage or mass effect. 5. No other abnormality in the neck. Mild emphysema in the lung apices.  Cerebral Angiogram 10/23/2016 Angiographically occluded internal carotid arteries in the cavernous segments at the level of the ophthalmic arteries bilaterally, without reconstitution from the external carotid artery branches. Pre occlusive string sign of the right internal carotid artery proximally extending to the occlusion in the cavernous segment as described above. Approximally 70% stenoses of the left internal carotid artery at the bulb again with a diminutive left internal carotid artery distal to this to its occluded cavernous segment. Approximately 85-90% stenoses at the origin of the right vertebral artery. Retrograde opacification of the anterior cerebral artery distribution bilaterally from the P3 leptomeningeal branches, and from the thalamic perforators from the P2 segment of the posterior cerebral arteries. Occluded pericallosal and callosal marginal branches of the anterior cerebral arteries at the level of the head of the corpus callosum.  CT Head Wo Contrast 10/28/2016 Expected evolution of left ACA and right MCA territory infarcts without evidence  of hemorrhagic transformation, new infarct or other new acute intracranial abnormality.  EEG   Diffuse slow activity, max over left temporal regions some improvement over the course of the recording.   EEG 10/31/16 This EEG is abnormal due to markedly diffuse background slowing. Clinical Correlation of the above findings indicates severe diffuse cerebral dysfunction that is non-specific in etiology and can be seen with hypoxic/ischemic injury, toxic/metabolic encephalopathies, neurodegenerative disorders, or medication effect.  The absence of epileptiform discharges does not rule out a clinical diagnosis of epilepsy.  Clinical correlation is advised.  Dg Chest Port 1 View 11/01/2016 IMPRESSION: 1. Little change in bibasilar atelectasis left-greater-than-right. 2. Endotracheal tube tip 5.1 cm above the carina.  Mr Brain Limited Wo Contrast 10/30/2016 IMPRESSION: Subacute moderate-size right frontal lobe subcortical infarct. Overall size relatively similar prior exam. Compared with 10/22/2016 MR, progressive enlargement of large left frontal lobe infarct now involving portions of the left caudate head and lenticular nucleus. Small area of extension to the left sub insular region. Local mass effect. Gradient and T1 imaging was not performed however, findings are suspicious for the presence of small amount of hemorrhage with these infarcts. Occluded internal carotid arteries with reconstitution of supraclinoid aspect which appears suboptimal given the infarcts. Slow flow within middle cerebral artery branches bilaterally. Prominent  opacification/ mucosal thickening paranasal sinuses most notable right maxillary sinus and ethmoid sinus air cells bilaterally.   PHYSICAL EXAM  Temp:  [97.1 F (36.2 C)-101.1 F (38.4 C)] 100.6 F (38.1 C) (12/07 0900) Pulse Rate:  [63-93] 74 (12/07 1000) Resp:  [10-18] 16 (12/07 1000) BP: (108-163)/(64-91) 117/68 (12/07 1000) SpO2:  [94 %-100 %] 100 % (12/07  1000) FiO2 (%):  [40 %] 40 % (12/07 1155) Weight:  [196 lb 13.9 oz (89.3 kg)] 196 lb 13.9 oz (89.3 kg) (12/07 0500)  General - Well nourished, well developed, intubated and no sedation.   Ophthalmologic - Fundi not visualized due to upward gaze and not able to position the ophthoscope.  Cardiovascular - Regular rhythm and rate  Neuro - intubated and on vent, not on sedation. Not open eyes on voice, but on pain stimulation, pt tries to open eyes and barely opened his eyes. PERRL, upward gaze with disconjugation of eyes, sluggish doll's eye present, positive corneal and gag. On pain stimulation, left LE 2/5 withdraw but other limbs minimal movement. DTR diminished and no babinski. Sensation, coordination and gait not tested.    ASSESSMENT/PLAN [name] is a 50 y.o. male with history of  newly diagnosed bilateral strokes, DM2, ETOH abuse HLD and HTN  presenting with confusion, L sided numbness and tingling x 2 weeks, neuro consulted for stroke and possible seizure.   Stroke:  Progressive bilateral anterior circulation infarcts in setting of bilateral ICA occlusion - Has uncontrolled DM II, HTN, alcohol and smoking R anterior circulation subacute infarct  1-2 weeks prior to PTA New progressive L MCA and ACA infarcts   Resultant  R sided weakness, AMS, respiratory failure  MRI head 10/21/15 - right MCA infarct   MRI head 10/22/16 - Progressive b/l anterior circulation infarcts, right MCA extension and L MCA, ACA and MCA/ACA infarct.  Repeat MRI 10/30/16 - progressive extension of right MCA, ACA infarcts - could be the cause of worsening mental status vs. Transient hypotension post intubation  CTA head and neck - right ICA occlusion at origin and left ICA occlusion at siphon with bilateral distal reconstitution from PCOM.  Cerebral angio - right ICA string sign proximally, b/l ICA occluded at cavernous portion, right VA 85% stenosis  2D Echo - EF 65 - 70%. No cardiac source of emboli  identified.  LDL 90  HgbA1c 8.5  Lovenox for VTE prophylaxis Diet NPO time specified Except for: Sips with Meds  No antithrombotic prior to admission, now on aspirin 325 mg daily. plavix on hold for potential incoming procedures  On provigil now for lethagy  Ongoing aggressive stroke risk factor management  Therapy recommendations: Pending  Disposition:  pending (lives w/ wife, works as a [employer] examiner)  Family not sure about further plan, they would like to see if he is able to tolerate weaning. If able to be off vent after trach, they are leaning towards trach.    Bilateral severe carotid disease  Could be associated with long term uncontrolled DM, HTN and smoking  Cerebra angio as above  Has plan for right VA stent in the future if pt has reasonable recovery  BP goal 140-160  Avoid hypotension  Fever   Leukocytosis 14.7-11.6-14.8-17.8  BCx pending - 1/4 growing staph but coagulase negative - likely contamination  Sputum culture pending  UA 10/28/16 negative  CXR no acute penumonia  CSF no sign of CNS infection.  On fortaz and vanco.  ? Seizure   Off valproic acid  due to high ammonia.  On keppra 500mg  bid  EEG diffuse slowing   Repeat EEG diffuse slowing and no seizure  Hyperammonemia  Ammonia level  [phone]  Currently off lactulose  Ammonia level today pending  Hypertension       Permissive hypertension (OK if < 220/120)   Long-term BP goal 140-160  Avoid hypotension BP low this am, gave NS 562ml bolus and increase IVF to 50cc/h  Hyperlipidemia  Home meds:  lipitor 10  Lipitor increased to 80 in hospital  LDL 90, goal < 70  Continue statin at discharge  Diabetes  HgbA1c 8.5  CBG still high  SSI  On lantus at home  Still on insulin drip for hyperglycemia currently   Alcohol Abuse  Heavy drinker per wife  Recent fall on 11/9 after birthday party  [place] again last week  Off CIWA protocol    Dysphagia  Secondary to stroke  On TF @ 70cc/h  Plan for PEG along with trach if decided  Tobacco abuse  Current smoker  Smoking cessation counseling will be provided  Other Stroke Risk Factors  Overweight, Body mass index is 29.93 kg/m., recommend weight loss, diet and exercise as appropriate  Obstructive sleep apnea, inconsistent use of CPAP at home.   Other Problems    This patient is critically ill due to bilateral stroke, bilateral carotid occlusion, right VA stenosis, fever, seizure, hyperammonemia and at significant risk of neurological worsening, death form recurrent stroke, hemorrhagic transformation, status epilepticus, sepsis, shock and brain death. This patient's care requires constant monitoring of vital signs, hemodynamics, respiratory and cardiac monitoring, review of multiple databases, neurological assessment, discussion with family, other specialists and medical decision making of high complexity. I spent 45 minutes of neurocritical care time in the care of this patient. I again had long discussion with wife at bedside, updated pt current condition, treatment options and possible prognosis. They would like to see how pt tolerate weaning trials.    Rosalin Hawking, MD PhD Stroke Neurology 11/02/2016 2:02 PM

## 2016-11-02 NOTE — Progress Notes (Signed)
PULMONARY / CRITICAL CARE MEDICINE   Name: Scott Harris MRN: [id number] DOB: 12-Jun-1966    ADMISSION DATE:  10/11/2016 CONSULTATION DATE:  10/30/16  REFERRING MD:  Claris Gower  CHIEF COMPLAINT:  Acute respiratory failure, altered MS  HISTORY OF PRESENT ILLNESS:   50 yo man, hx EtOH abuse, HTN, DM, hyperlipidemia, admitted 11/25 with slurred speech, confusion, lethargy. 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 CTA neck and then IR angiogram > severe B carotid disease. It was hoped he would be candidate for CIR after PEG placement for dysphagia. He has experienced progressive altered MS and lethargy since 12/2. A repeat Head CT 12/2 did not show new bleed or edema. No evidence seizure activity noted. Ammonia was slightly elevated and empiric lactulose given.   SUBJECTIVE:  fent started for agitation retention  VITAL SIGNS: BP 117/68   Pulse 74   Temp (!) 100.6 F (38.1 C) (Rectal)   Resp 16   Ht 5\' 8"  (1.727 m)   Wt 89.3 kg (196 lb 13.9 oz)   SpO2 100%   BMI 29.93 kg/m   HEMODYNAMICS:    VENTILATOR SETTINGS: Vent Mode: PSV;CPAP FiO2 (%):  [40 %] 40 % Set Rate:  [16 bmp] 16 bmp Vt Set:  [570 mL] 570 mL PEEP:  [5 cmH20] 5 cmH20 Pressure Support:  [5 cmH20] 5 cmH20 Plateau Pressure:  [13 cmH20-16 cmH20] 16 cmH20  INTAKE / OUTPUT: I/O last 3 completed shifts: In: 3241.4 [I.V.:1091.4; NG/GT:1750; IV Piggyback:400] Out: 50 [Urine:1550]  PHYSICAL EXAMINATION: General: Sedated, intubated , left gaze now upward left Neuro:  No fc, agitation improved, not pripsoeful HEENT:  PERR Cardiovascular:  s1 s2 RRR bot tachy Lungs:  Coarse Bilateral Abdomen:  Soft, hypoactive bowel sounds.  Musculoskeletal:  No edema, no deformities Skin:  warm  LABS:  BMET  Recent Labs Lab 10/31/16 0252 11/01/16 0210 11/02/16 0315  NA 141 143 142  K 4.1 3.4* 3.8  CL 105 107 109  CO2 27 29 27   BUN 19 17 14   CREATININE 0.69 0.64 0.62  GLUCOSE 229* 199* 161*     Electrolytes  Recent Labs Lab 10/31/16 0252 11/01/16 0210 11/02/16 0315  CALCIUM 9.1 8.8* 8.5*    CBC  Recent Labs Lab 10/30/16 0606 11/01/16 0210 11/02/16 0315  WBC 17.8* 15.6* 11.8*  HGB 15.8 13.0 11.4*  HCT 46.7 40.2 34.6*  PLT 290 250 237    Coag's  Recent Labs Lab 10/28/16 1440 11/01/16 0210  APTT 30  --   INR 0.98 1.05    Sepsis Markers  Recent Labs Lab 10/28/16 1440 10/28/16 1632 10/30/16 1006 10/31/16 0252 11/01/16 0210  LATICACIDVEN 0.9 0.9  --   --   --   PROCALCITON <0.10  --  0.24 <0.10 <0.10    ABG  Recent Labs Lab 10/30/16 0915 10/31/16 0416 11/01/16 0500  PHART 7.319* 7.420 7.380  PCO2ART 54.7* 42.6 48.5*  PO2ART 171* 110* 134*    Liver Enzymes  Recent Labs Lab 10/28/16 1440 10/30/16 0606  AST 20 27  ALT 33 35  ALKPHOS 75 71  BILITOT 0.5 0.8  ALBUMIN 3.2* 3.0*    Cardiac Enzymes No results for input(s): TROPONINI, PROBNP in the last 168 hours.  Glucose  Recent Labs Lab 11/02/16 0339 11/02/16 0437 11/02/16 0541 11/02/16 0651 11/02/16 0759 11/02/16 0911  GLUCAP 145* 155* 121* 134* 125* 171*    Imaging Dg Chest Port 1 View  Result Date: 11/02/2016 CLINICAL DATA:  Ventilator support.  Follow-up. EXAM: PORTABLE CHEST 1  VIEW COMPARISON:  11/01/2016 FINDINGS: Endotracheal tube has its tip 2 cm above the carina. Nasogastric tube enters the stomach. Slight worsening of atelectasis in both lower lobes. Upper lungs remain clear. No edema. IMPRESSION: Endotracheal tube and nasogastric tube well position. Slight worsening of lower lobe atelectasis. Electronically Signed   By: Nelson Chimes M.D.   On: 11/02/2016 08:21     STUDIES:  EEG 11/28 and 29 >> no seizure activity or focus. Diffuse slow activity MRI Brain >>  Cerebral angiogram 11/27 >> B ICA stenosis, 100% R, 99% on L 12/4 MRI as noted 12/5 LP done  CULTURES: Blood 12/2 >> coag neg staph Blood 12/4 >>  Sputum 12/5>> 12/5 LP>>>  ANTIBIOTICS: Zosyn  12/4 >> 12/5 Ceftaz 12/5>> vanc 12/5>>  SIGNIFICANT EVENTS: 12/5 biting ETT, fevers  LINES/TUBES: ETT 12/4 >>   DISCUSSION: 50 yo man with HTN, DM, EtOH abuse experience d progressive lethargy and resp failure in setting B frontal CVA's. Course has been consistent with progressive poor airway management. No clear evidence for PNA although he did have fever am 12/4. Intubated and empiric abx added. Needs repeat Ct head to insure no bleed, edema. Suspect that this is progression of this original insult, portends long recovery period to unclear plateau. These factors may make early trach / PEG reasonable. [place]. Still bites tube.  ASSESSMENT / PLAN:  PULMONARY A: Acute resp failure due to poor airway protection, neurological process Consider aspiration event / PNA, but CXR and course not compelling for this. Fever noted 12/4 04:00 Hx tobacco use  P:   needs trach vs no trach coption extubation next week and hope fo the best Family unclear of direction Weaning today PS, failed from low effort, repeat in pm  NEUROLOGIC A:   Bilateral large frontal CVAs Progressive lethargy and dysphagia, likely due to above. Hyperammonemia was considered earlier in course At risk EtOH withdrawal No evidence seizure activity 12/5 biting ETT P:   RASS goal: -1 to 0 ASA 325mg  Thiamine + folate  keppra bid Modafinil  fent drip to off today May need precedex attempt in future  CARDIOVASCULAR A:  HTN Hyperlipidemia P:  BP control goal SBP  140 - 170; hydralazine prn, low side, none needed Statin ordered  KVO IVF 12/4 Agree bolus MAP goal consider 75  RENAL  Recent Labs Lab 10/31/16 0252 11/01/16 0210 11/02/16 0315  K 4.1 3.4* 3.8     A:   No acute issues P:   Follow BMP am follw response to pos balance, then kvo if not responsive   GASTROINTESTINAL A:   SUP Nutrition  P:   protonix > changed to pepcid 12/4 am TF No BM, add mir lactulose  HEMATOLOGIC A:    DVT  prophylaxis P:  On enoxaparin   INFECTIOUS A:   Fever Possible aspiration PNA bc with staph- contamination likley-repeat BC neg P:   Not impressed with infiltrate, dc abx and observe   ENDOCRINE CBG (last 3)   Recent Labs  11/02/16 0651 11/02/16 0759 11/02/16 0911  GLUCAP 134* 125* 171*     A:   DM, contolled P:   lantus + SSI Dc drip transiton Add TF coverage   FAMILY  - Updates: Updated wife at bedside am 12/5  - Inter-disciplinary family meet or Palliative Care meeting due by:  11/06/16   35 in ccm time   Lavon Paganini. Titus Mould, MD, [place] Pgr: [facility] 11/02/2016 11:09 AM

## 2016-11-03 LAB — CULTURE, BLOOD (ROUTINE X 2): Culture: NO GROWTH

## 2016-11-03 LAB — BODY FLUID CULTURE
Culture: NO GROWTH
GRAM STAIN: NONE SEEN

## 2016-11-03 LAB — GLUCOSE, CAPILLARY
GLUCOSE-CAPILLARY: 202 mg/dL — AB (ref 65–99)
GLUCOSE-CAPILLARY: 209 mg/dL — AB (ref 65–99)
GLUCOSE-CAPILLARY: 230 mg/dL — AB (ref 65–99)
GLUCOSE-CAPILLARY: 247 mg/dL — AB (ref 65–99)
Glucose-Capillary: 133 mg/dL — ABNORMAL HIGH (ref 65–99)
Glucose-Capillary: 190 mg/dL — ABNORMAL HIGH (ref 65–99)

## 2016-11-03 LAB — BASIC METABOLIC PANEL
Anion gap: 10 (ref 5–15)
BUN: 8 mg/dL (ref 6–20)
CALCIUM: 8.9 mg/dL (ref 8.9–10.3)
CO2: 29 mmol/L (ref 22–32)
CREATININE: 0.7 mg/dL (ref 0.61–1.24)
Chloride: 101 mmol/L (ref 101–111)
GFR calc non Af Amer: >60 mL/min (ref >60)
GLUCOSE: 149 mg/dL — AB (ref 65–99)
Potassium: 4.2 mmol/L (ref 3.5–5.1)
Sodium: 140 mmol/L (ref 135–145)

## 2016-11-03 MED ORDER — LACTULOSE 10 GM/15ML PO SOLN
30.0000 g | Freq: Three times a day (TID) | ORAL | Status: DC
  Administered 2016-11-03 – 2016-11-04 (×5): 30 g
  Filled 2016-11-03 (×6): qty 45

## 2016-11-03 MED ORDER — FENTANYL 2500MCG IN NS 250ML (10MCG/ML) PREMIX INFUSION: 25.0000 ug/h | INTRAVENOUS | Status: DC

## 2016-11-03 MED ORDER — DEXMEDETOMIDINE HCL IN NACL 200 MCG/50ML IV SOLN
0.4000 ug/kg/h | INTRAVENOUS | Status: DC
  Administered 2016-11-03 – 2016-11-04 (×6): 0.8 ug/kg/h via INTRAVENOUS
  Administered 2016-11-04: 0.7 ug/kg/h via INTRAVENOUS
  Administered 2016-11-04: 0.8 ug/kg/h via INTRAVENOUS
  Filled 2016-11-03 (×9): qty 50

## 2016-11-03 MED ORDER — FENTANYL BOLUS VIA INFUSION
50.0000 ug | INTRAVENOUS | Status: DC | PRN
  Filled 2016-11-03: qty 50

## 2016-11-03 MED ORDER — FENTANYL CITRATE (PF) 100 MCG/2ML IJ SOLN: 50.0000 ug | Freq: Once | INTRAMUSCULAR | Status: DC

## 2016-11-03 MED ORDER — ACETAMINOPHEN 160 MG/5ML PO SOLN
650.0000 mg | Freq: Once | ORAL | Status: AC
  Administered 2016-11-03: 650 mg
  Filled 2016-11-03: qty 20.3

## 2016-11-03 NOTE — Progress Notes (Signed)
PULMONARY / CRITICAL CARE MEDICINE   Name: Scott Harris MRN: [id number] DOB: 27-Jan-1966    ADMISSION DATE:  10/18/2016 CONSULTATION DATE:  10/30/16  REFERRING MD:  Claris Gower  CHIEF COMPLAINT:  Acute respiratory failure, altered MS  HISTORY OF PRESENT ILLNESS:   50 yo man, hx EtOH abuse, HTN, DM, hyperlipidemia, admitted 11/25 with slurred speech, confusion, lethargy. 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 CTA neck and then IR angiogram > severe B carotid disease. It was hoped he would be candidate for CIR after PEG placement for dysphagia. He has experienced progressive altered MS and lethargy since 12/2. A repeat Head CT 12/2 did not show new bleed or edema. No evidence seizure activity noted. Ammonia was slightly elevated and empiric lactulose given.   SUBJECTIVE:  Fever No changes in neurostatus  VITAL SIGNS: BP (!) 160/88   Pulse 71   Temp 100.1 F (37.8 C) (Rectal)   Resp 16   Ht 5\' 8"  (1.727 m)   Wt 91 kg (200 lb 9.9 oz)   SpO2 98%   BMI 30.50 kg/m   HEMODYNAMICS:    VENTILATOR SETTINGS: Vent Mode: PRVC FiO2 (%):  [30 %-40 %] 30 % Set Rate:  [16 bmp] 16 bmp Vt Set:  [570 mL] 570 mL PEEP:  [5 cmH20] 5 cmH20 Plateau Pressure:  [14 cmH20-15 cmH20] 15 cmH20  INTAKE / OUTPUT: I/O last 3 completed shifts: In: 4749.6 [I.V.:1729.6; NG/GT:2520; IV Piggyback:500] Out: 2575 [Urine:2575]  PHYSICAL EXAMINATION: General: Sedated, intubated Neuro: no changes, does not fc, int agtation HEENT:  PERR Cardiovascular:  s1 s2 RRR Lungs:  Coars Abdomen:  Soft,  bowel sounds, no r/g Musculoskeletal:  No edema, no deformities Skin:  warm  LABS:  BMET  Recent Labs Lab 11/01/16 0210 11/02/16 0315 11/03/16 0258  NA 143 142 140  K 3.4* 3.8 4.2  CL 107 109 101  CO2 29 27 29   BUN 17 14 8   CREATININE 0.64 0.62 0.70  GLUCOSE 199* 161* 149*    Electrolytes  Recent Labs Lab 11/01/16 0210 11/02/16 0315 11/03/16 0258  CALCIUM 8.8* 8.5* 8.9    CBC  Recent  Labs Lab 10/30/16 0606 11/01/16 0210 11/02/16 0315  WBC 17.8* 15.6* 11.8*  HGB 15.8 13.0 11.4*  HCT 46.7 40.2 34.6*  PLT 290 250 237    Coag's  Recent Labs Lab 10/28/16 1440 11/01/16 0210  APTT 30  --   INR 0.98 1.05    Sepsis Markers  Recent Labs Lab 10/28/16 1440 10/28/16 1632 10/30/16 1006 10/31/16 0252 11/01/16 0210  LATICACIDVEN 0.9 0.9  --   --   --   PROCALCITON <0.10  --  0.24 <0.10 <0.10    ABG  Recent Labs Lab 10/30/16 0915 10/31/16 0416 11/01/16 0500  PHART 7.319* 7.420 7.380  PCO2ART 54.7* 42.6 48.5*  PO2ART 171* 110* 134*    Liver Enzymes  Recent Labs Lab 10/28/16 1440 10/30/16 0606  AST 20 27  ALT 33 35  ALKPHOS 75 71  BILITOT 0.5 0.8  ALBUMIN 3.2* 3.0*    Cardiac Enzymes No results for input(s): TROPONINI, PROBNP in the last 168 hours.  Glucose  Recent Labs Lab 11/02/16 1124 11/02/16 1612 11/02/16 1937 11/02/16 2345 11/03/16 0305 11/03/16 0742  GLUCAP 193* 232* 220* 142* 133* 202*    Imaging No results found.   STUDIES:  EEG 11/28 and 29 >> no seizure activity or focus. Diffuse slow activity MRI Brain >>  Cerebral angiogram 11/27 >> B ICA stenosis, 100% R, 99%  on L 12/4 MRI as noted 12/5 LP done  CULTURES: Blood 12/2 >> coag neg staph Blood 12/4 >>  Sputum 12/5>> 12/5 LP>>>  ANTIBIOTICS: Zosyn 12/4 >> 12/5 Ceftaz 12/5>>12/7 vanc 12/5>>12/7  SIGNIFICANT EVENTS: 12/5 biting ETT, fevers  LINES/TUBES: ETT 12/4 >>   DISCUSSION: 50 yo man with HTN, DM, EtOH abuse experience d progressive lethargy and resp failure in setting B frontal CVA's. Course has been consistent with progressive poor airway management. No clear evidence for PNA although he did have fever am 12/4. Intubated and empiric abx added. Needs repeat Ct head to insure no bleed, edema. Suspect that this is progression of this original insult, portends long recovery period to unclear plateau. These factors may make early trach / PEG reasonable.  [place]. Still bites tube.  ASSESSMENT / PLAN:  PULMONARY A: Acute resp failure due to poor airway protection, neurological process Consider aspiration event / PNA, but CXR and course not compelling for this. Fever noted 12/4 04:00 Hx tobacco use  P:   needs trach vs no trach coption extubation next week Wan cpap 5 ps5 as goal, may need PS increase  NEUROLOGIC A:   Bilateral large frontal CVAs Progressive lethargy and dysphagia, likely due to above. Hyperammonemia was considered earlier in course At risk EtOH withdrawal No evidence seizure activity biting ETT P:   RASS goal: -1 to 0 ASA 325mg  Thiamine + folate  keppra bid Modafinil  fent Consider precedex  CARDIOVASCULAR A:  HTN Hyperlipidemia P:  BP control goal SBP  140 - 170, in range overall, slight low Statin ordered  May need further bolus MAP goal consider 75  RENAL  Recent Labs Lab 11/01/16 0210 11/02/16 0315 11/03/16 0258  K 3.4* 3.8 4.2     A:   No acute issues P:   Follow BMP am Fluids per neuro  GASTROINTESTINAL A:   SUP Nutrition  P:   protonix > changed to pepcid 12/4 am TF mir lactulose  HEMATOLOGIC A:    DVT prophylaxis P:  On enoxaparin   INFECTIOUS A:   Fever Possible aspiration PNA bc with staph- contamination likley-repeat BC neg P:   Fever neuro? Tylenal, liit to 1-2 grams a day with etoh  ENDOCRINE CBG (last 3)   Recent Labs  11/02/16 2345 11/03/16 0305 11/03/16 0742  GLUCAP 142* 133* 202*     A:   DM, contolled P:   lantus (low 140) + SSI TF coverage   FAMILY  - Updates: Updated wife at bedside am 12/5  - Inter-disciplinary family meet or Palliative Care meeting due by:  11/06/16   35 in ccm time  Lavon Paganini. Titus Mould, MD, [place] Pgr: [facility] 11/03/2016 10:54 AM

## 2016-11-03 NOTE — Progress Notes (Signed)
Patients temperature has continued to increase. Cooling blanket has been on, bath given, room temperature decreased. Max temperature now 101.8. [facility] called and a one time dose of acetaminophen requested. Orders placed. Will continue to monitor.

## 2016-11-03 NOTE — Progress Notes (Signed)
STROKE TEAM PROGRESS NOTE   SUBJECTIVE (INTERVAL HISTORY) His wife And father are at the bedside. No significant neuro changes from yesterday. Off fentanyl this am but on stimulation, continued to cough and gag. Not tolerating weaning yesterday, continued to have apneic episodes, no back on vent support.  OBJECTIVE Temp:  [98 F (36.7 C)-102.5 F (39.2 C)] 101.6 F (38.7 C) (12/08 0500) Pulse Rate:  [63-87] 68 (12/08 0700) Cardiac Rhythm: Normal sinus rhythm (12/08 0400) Resp:  [14-17] 16 (12/08 0700) BP: (110-147)/(64-89) 125/66 (12/08 0700) SpO2:  [96 %-100 %] 100 % (12/08 0700) FiO2 (%):  [40 %] 40 % (12/08 0437) Weight:  [91 kg (200 lb 9.9 oz)] 91 kg (200 lb 9.9 oz) (12/08 0416)  CBC:   Recent Labs Lab 10/28/16 1440  11/01/16 0210 11/02/16 0315  WBC 11.6*  < > 15.6* 11.8*  NEUTROABS 8.4*  --   --   --   HGB 16.3  < > 13.0 11.4*  HCT 45.4  < > 40.2 34.6*  MCV 88.3  < > 93.7 92.3  PLT 251  < > 250 237  < > = values in this interval not displayed.  Basic Metabolic Panel:   Recent Labs Lab 11/02/16 0315 11/03/16 0258  NA 142 140  K 3.8 4.2  CL 109 101  CO2 27 29  GLUCOSE 161* 149*  BUN 14 8  CREATININE 0.62 0.70  CALCIUM 8.5* 8.9    Lipid Panel:     Component Value Date/Time   CHOL 172 10/22/2016 1425   TRIG 157 (H) 10/22/2016 1425   HDL 51 10/22/2016 1425   CHOLHDL 3.4 10/22/2016 1425   VLDL 31 10/22/2016 1425   LDLCALC 90 10/22/2016 1425   HgbA1c:  Lab Results  Component Value Date   HGBA1C 8.5 (H) 10/22/2016   Urine Drug Screen:     Component Value Date/Time   LABOPIA NONE DETECTED 10/20/2016 1437   COCAINSCRNUR NONE DETECTED 10/20/2016 1437   LABBENZ NONE DETECTED 10/20/2016 1437   AMPHETMU NONE DETECTED 10/20/2016 1437   THCU NONE DETECTED 10/20/2016 1437   LABBARB NONE DETECTED 10/20/2016 1437      IMAGING I have personally reviewed the radiological images below and agree with the radiology interpretations.  MRI 10/20/16 1. Area of  diffusion restriction and hyperintense T2 weighted signal within the right frontal white matter. The distribution is not typical of ischemia and the appearance is concerning for a neoplastic process, such as a low grade glioma. Tumefactive demyelinating disease is also a consideration, though if the patient's symptoms are truly acute, some degree of contrast enhancement would be expected. Short-term follow-up with brain MRI with and without contrast in 4-6 weeks is recommended, as change over time may be helpful in diagnosis. Additionally, CSF sampling should be considered. 2. No hemorrhage or mass effect.  MRI 10/22/16 1. Emergent Anterior Circulation Large Vessel Occlusions - both Internal Carotid Arteries likely affected. Progressive bilateral anterior circulation infarcts, newly involving the left MCA/ACA territories. 2. No associated hemorrhage or mass effect at this time. 3. This was discussed by telephone with Dr. Elpidio Anis On 10/22/2016 at 1037 hours. We are arranging for the patient to undergo stat CTA head and neck at the time of this dictation.  CT Head Wo Contrast 10/25/2016 Low-density in the known infarctions affecting the right frontal deep white matter in the left frontal cortical and subcortical brain. No sign of hemorrhagic transformation or significant mass effect/shift. The seems to be a little more involvement  in the left basal ganglia by today's CT.  CTA Head and Neck 10/22/2016 1. Functional occlusion of both ICA siphons with suboptimal reconstitution of flow at both ICA termini: Radiographic string sign stenosis at the right ICA origin in the neck related to bulky low-density plaque or thrombus.  The cervical Right ICA demonstrates thread-like enhancement and appears to occlude at the skullbase.  Suspected much of the Right ICA siphon is fully occluded. Tapered appearance of the cervical Left ICA without focal stenosis in the neck. There is thread-like  enhancement in the Left ICA siphon, but suspected the supraclinoid Left ICA segment is fully occluded.  Thus the tapering in the neck is likely do to distal outflow obstruction. Reconstituted flow from the posterior communicating arteries, which unfortunately are diminutive. No ACA or MCA occlusion identified, but the anterior circulation appears diminutive throughout. 2. Preliminary report of the above was discussed with Dr. Elpidio Anis at 1138 hours. 3. Posterior circulation is largely normal. There does appear to be a moderate or severe atherosclerotic stenosis at the right vertebral artery origin, but no other posterior circulation stenosis. 4. Expected CT appearance of evolving bilateral anterior circulation infarcts. No associated hemorrhage or mass effect. 5. No other abnormality in the neck. Mild emphysema in the lung apices.  Cerebral Angiogram 10/23/2016 Angiographically occluded internal carotid arteries in the cavernous segments at the level of the ophthalmic arteries bilaterally, without reconstitution from the external carotid artery branches. Pre occlusive string sign of the right internal carotid artery proximally extending to the occlusion in the cavernous segment as described above. Approximally 70% stenoses of the left internal carotid artery at the bulb again with a diminutive left internal carotid artery distal to this to its occluded cavernous segment. Approximately 85-90% stenoses at the origin of the right vertebral artery. Retrograde opacification of the anterior cerebral artery distribution bilaterally from the P3 leptomeningeal branches, and from the thalamic perforators from the P2 segment of the posterior cerebral arteries. Occluded pericallosal and callosal marginal branches of the anterior cerebral arteries at the level of the head of the corpus callosum.  CT Head Wo Contrast 10/28/2016 Expected evolution of left ACA and right MCA territory infarcts without evidence  of hemorrhagic transformation, new infarct or other new acute intracranial abnormality.  EEG   Diffuse slow activity, max over left temporal regions some improvement over the course of the recording.   EEG 10/31/16 This EEG is abnormal due to markedly diffuse background slowing. Clinical Correlation of the above findings indicates severe diffuse cerebral dysfunction that is non-specific in etiology and can be seen with hypoxic/ischemic injury, toxic/metabolic encephalopathies, neurodegenerative disorders, or medication effect.  The absence of epileptiform discharges does not rule out a clinical diagnosis of epilepsy.  Clinical correlation is advised.  Dg Chest Port 1 View 11/02/2016 Endotracheal tube and nasogastric tube well position.  Slight worsening of lower lobe atelectasis.  Mr Brain Limited Wo Contrast 10/30/2016 IMPRESSION: Subacute moderate-size right frontal lobe subcortical infarct. Overall size relatively similar prior exam. Compared with 10/22/2016 MR, progressive enlargement of large left frontal lobe infarct now involving portions of the left caudate head and lenticular nucleus. Small area of extension to the left sub insular region. Local mass effect. Gradient and T1 imaging was not performed however, findings are suspicious for the presence of small amount of hemorrhage with these infarcts. Occluded internal carotid arteries with reconstitution of supraclinoid aspect which appears suboptimal given the infarcts. Slow flow within middle cerebral artery branches bilaterally. Prominent opacification/ mucosal thickening paranasal  sinuses most notable right maxillary sinus and ethmoid sinus air cells bilaterally.   PHYSICAL EXAM  Temp:  [98 F (36.7 C)-102.5 F (39.2 C)] 101.6 F (38.7 C) (12/08 0500) Pulse Rate:  [63-87] 68 (12/08 0700) Resp:  [14-17] 16 (12/08 0700) BP: (110-147)/(64-89) 125/66 (12/08 0700) SpO2:  [96 %-100 %] 100 % (12/08 0700) FiO2 (%):  [40 %] 40 % (12/08  0437) Weight:  [91 kg (200 lb 9.9 oz)] 91 kg (200 lb 9.9 oz) (12/08 0416)  General - Well nourished, well developed, intubated and no sedation.   Ophthalmologic - Fundi not visualized due to upward gaze and not able to position the ophthoscope.  Cardiovascular - Regular rhythm and rate  Neuro - intubated and on vent, not on sedation. Not open eyes on voice, but on pain stimulation, pt tries to open eyes and barely opened his eyes. PERRL, upward gaze with disconjugation of eyes, sluggish doll's eye present, positive corneal and gag. On pain stimulation, left LE 2/5 withdraw but other limbs minimal movement. DTR diminished and no babinski. Sensation, coordination and gait not tested.    ASSESSMENT/PLAN [name] is a 50 y.o. male with history of  newly diagnosed bilateral strokes, DM2, ETOH abuse HLD and HTN  presenting with confusion, L sided numbness and tingling x 2 weeks, neuro consulted for stroke and possible seizure.   Stroke:  Progressive bilateral anterior circulation infarcts in setting of bilateral ICA occlusion - Has uncontrolled DM II, HTN, alcohol and smoking R anterior circulation subacute infarct  1-2 weeks prior to PTA New progressive L MCA and ACA infarcts   Resultant  R sided weakness, AMS, respiratory failure  MRI head 10/21/15 - right MCA infarct   MRI head 10/22/16 - Progressive b/l anterior circulation infarcts, right MCA extension and L MCA, ACA and MCA/ACA infarct.  Repeat MRI 10/30/16 - progressive extension of right MCA, ACA infarcts - could be the cause of worsening mental status vs. Transient hypotension post intubation  CTA head and neck - right ICA occlusion at origin and left ICA occlusion at siphon with bilateral distal reconstitution from PCOM.  Cerebral angio - right ICA string sign proximally, b/l ICA occluded at cavernous portion, right VA 85% stenosis  2D Echo - EF 65 - 70%. No cardiac source of emboli identified.  LDL 90  HgbA1c  8.5  Lovenox for VTE prophylaxis Diet NPO time specified Except for: Sips with Meds  No antithrombotic prior to admission, now on aspirin 325 mg daily. plavix on hold for potential incoming procedures  On provigil now for lethagy  Ongoing aggressive stroke risk factor management  Therapy recommendations: Pending  Disposition:  pending (lives w/ wife, works as a Patent examiner)  Family not sure about further plan, they would like to see if he is able to tolerate weaning. If able to be off vent after trach, they are leaning towards trach.    Bilateral severe carotid disease  Could be associated with long term uncontrolled DM, HTN and smoking  Cerebra angio as above  Has plan for right VA stent in the future if pt has reasonable recovery  BP goal 140-160  Avoid hypotension  Fever   Leukocytosis 14.7-11.6-14.8-17.8 - 11.8  BCx pending - 1/4 growing staph but coagulase negative - likely contamination  Sputum culture pending  UA 10/28/16 negative  CXR 11/02/2016 - Slight worsening of lower lobe atelectasis.  CSF no sign of CNS infection.  Currently off antibiotics - CCM following - cultures no  growth  T Max 102.5 (rectal) Friday  ? Seizure   Off valproic acid due to high ammonia.  On keppra 500mg  bid  EEG diffuse slowing   Repeat EEG diffuse slowing and no seizure  Hyperammonemia  Ammonia level  [phone]  Currently off lactulose  Ammonia level tomorrow  Hypertension       Permissive hypertension (OK if < 220/120)   Long-term BP goal 140-160  Avoid hypotension  BP still fluctuating due to sedation  Hyperlipidemia  Home meds:  lipitor 10  Lipitor increased to 80 in hospital  LDL 90, goal < 70  Continue statin at discharge  Diabetes  HgbA1c 8.5  CBG still high  SSI  off insulin drip now on lantus  Alcohol abuse  Heavy drinker per wife  Recent fall on 11/9 after birthday party  [place] again last week  Off CIWA protocol    Dysphagia  Secondary to stroke  On TF @ 70cc/h  Plan for PEG along with trach if decided  Tobacco abuse  Current smoker  Smoking cessation counseling will be provided  Other Stroke Risk Factors  Overweight, Body mass index is 30.5 kg/m., recommend weight loss, diet and exercise as appropriate  Obstructive sleep apnea, inconsistent use of CPAP at home.   Other Problems    This patient is critically ill due to bilateral stroke, bilateral carotid occlusion, right VA stenosis, fever, seizure, hyperammonemia and at significant risk of neurological worsening, death form recurrent stroke, hemorrhagic transformation, status epilepticus, sepsis, shock and brain death. This patient's care requires constant monitoring of vital signs, hemodynamics, respiratory and cardiac monitoring, review of multiple databases, neurological assessment, discussion with family, other specialists and medical decision making of high complexity. I spent 45 minutes of neurocritical care time in the care of this patient.  Rosalin Hawking, MD PhD Stroke Neurology 11/03/2016 5:00 PM

## 2016-11-03 NOTE — Progress Notes (Signed)
Bite block in place. Deep oral sxn done.

## 2016-11-04 ENCOUNTER — Inpatient Hospital Stay (HOSPITAL_COMMUNITY): Payer: 59

## 2016-11-04 LAB — BASIC METABOLIC PANEL
Anion gap: 7 (ref 5–15)
BUN: 9 mg/dL (ref 6–20)
CALCIUM: 9.2 mg/dL (ref 8.9–10.3)
CO2: 29 mmol/L (ref 22–32)
CREATININE: 0.54 mg/dL — AB (ref 0.61–1.24)
Chloride: 101 mmol/L (ref 101–111)
GFR calc Af Amer: >60 mL/min (ref >60)
GFR calc non Af Amer: >60 mL/min (ref >60)
GLUCOSE: 179 mg/dL — AB (ref 65–99)
Potassium: 4.6 mmol/L (ref 3.5–5.1)
Sodium: 137 mmol/L (ref 135–145)

## 2016-11-04 LAB — CBC
HEMATOCRIT: 36.1 % — AB (ref 39.0–52.0)
Hemoglobin: 12.4 g/dL — ABNORMAL LOW (ref 13.0–17.0)
MCH: 31 pg (ref 26.0–34.0)
MCHC: 34.3 g/dL (ref 30.0–36.0)
MCV: 90.3 fL (ref 78.0–100.0)
Platelets: 289 10*3/uL (ref 150–400)
RBC: 4 MIL/uL — ABNORMAL LOW (ref 4.22–5.81)
RDW: 12.4 % (ref 11.5–15.5)
WBC: 19.3 10*3/uL — ABNORMAL HIGH (ref 4.0–10.5)

## 2016-11-04 LAB — AMMONIA: Ammonia: 41 umol/L — ABNORMAL HIGH (ref 9–35)

## 2016-11-04 LAB — GLUCOSE, CAPILLARY
GLUCOSE-CAPILLARY: 250 mg/dL — AB (ref 65–99)
Glucose-Capillary: 161 mg/dL — ABNORMAL HIGH (ref 65–99)
Glucose-Capillary: 225 mg/dL — ABNORMAL HIGH (ref 65–99)
Glucose-Capillary: 210 mg/dL — ABNORMAL HIGH (ref 65–99)
Glucose-Capillary: 311 mg/dL — ABNORMAL HIGH (ref 65–99)

## 2016-11-04 LAB — PROCALCITONIN: Procalcitonin: <0.1 ng/mL

## 2016-11-04 MED ORDER — VANCOMYCIN HCL IN DEXTROSE 1-5 GM/200ML-% IV SOLN
1000.0000 mg | Freq: Three times a day (TID) | INTRAVENOUS | Status: DC
  Administered 2016-11-05 (×2): 1000 mg via INTRAVENOUS
  Filled 2016-11-04 (×4): qty 200

## 2016-11-04 MED ORDER — ONDANSETRON HCL 4 MG/2ML IJ SOLN
4.0000 mg | Freq: Three times a day (TID) | INTRAMUSCULAR | Status: DC | PRN
  Administered 2016-11-04: 4 mg via INTRAVENOUS
  Filled 2016-11-04: qty 2

## 2016-11-04 MED ORDER — PIPERACILLIN-TAZOBACTAM 3.375 G IVPB 30 MIN
3.3750 g | Freq: Once | INTRAVENOUS | Status: AC
  Administered 2016-11-04: 3.375 g via INTRAVENOUS
  Filled 2016-11-04: qty 50

## 2016-11-04 MED ORDER — FOLIC ACID 1 MG PO TABS
1.0000 mg | ORAL_TABLET | Freq: Every day | ORAL | Status: DC
  Administered 2016-11-05: 1 mg via ORAL
  Filled 2016-11-04: qty 1

## 2016-11-04 MED ORDER — ACETAMINOPHEN 325 MG PO TABS
650.0000 mg | ORAL_TABLET | Freq: Four times a day (QID) | ORAL | Status: DC | PRN
  Administered 2016-11-04 – 2016-11-05 (×3): 650 mg via ORAL
  Filled 2016-11-04 (×3): qty 2

## 2016-11-04 MED ORDER — VANCOMYCIN HCL 10 G IV SOLR
1500.0000 mg | Freq: Once | INTRAVENOUS | Status: AC
  Administered 2016-11-04: 1500 mg via INTRAVENOUS
  Filled 2016-11-04: qty 1500

## 2016-11-04 MED ORDER — PIPERACILLIN-TAZOBACTAM 3.375 G IVPB
3.3750 g | Freq: Three times a day (TID) | INTRAVENOUS | Status: DC
  Administered 2016-11-04 – 2016-11-05 (×3): 3.375 g via INTRAVENOUS
  Filled 2016-11-04 (×5): qty 50

## 2016-11-04 MED ORDER — PANTOPRAZOLE SODIUM 40 MG PO PACK
40.0000 mg | PACK | Freq: Every day | ORAL | Status: DC
  Administered 2016-11-05: 40 mg
  Filled 2016-11-04: qty 20

## 2016-11-04 MED ORDER — INSULIN GLARGINE 100 UNIT/ML ~~LOC~~ SOLN
15.0000 [IU] | Freq: Every day | SUBCUTANEOUS | Status: DC
  Filled 2016-11-04: qty 0.15

## 2016-11-04 MED ORDER — DEXTROSE 50 % IV SOLN: 25.0000 mL | Freq: Once | INTRAVENOUS | Status: DC

## 2016-11-04 MED ORDER — PANTOPRAZOLE SODIUM 40 MG PO TBEC: 40.0000 mg | DELAYED_RELEASE_TABLET | Freq: Every day | ORAL | Status: DC

## 2016-11-04 MED ORDER — POLYETHYLENE GLYCOL 3350 17 G PO PACK: 17.0000 g | PACK | Freq: Every day | ORAL | Status: DC | PRN

## 2016-11-04 NOTE — Progress Notes (Signed)
[place] Progress Note Patient Name: Jawon Alvidrez DOB: 1966/05/23 MRN: [id number]   Date of Service  11/04/2016  HPI/Events of Note  2 episodes of emesis.  On TFs  eICU Interventions  Plan: Zofran PRN Nurse to check TF residuals     Intervention Category Minor Interventions: Routine modifications to care plan (e.g. PRN medications for pain, fever);Other:  Coree Riester 11/04/2016, 1:14 AM

## 2016-11-04 NOTE — Progress Notes (Signed)
PULMONARY / CRITICAL CARE MEDICINE   Name: Scott Harris MRN: [id number] DOB: 01/22/1966    ADMISSION DATE:  10/15/2016 CONSULTATION DATE:  10/30/16  REFERRING MD:  Claris Gower  CHIEF COMPLAINT:  Acute respiratory failure, altered MS  HISTORY OF PRESENT ILLNESS:   50 yo man, hx EtOH abuse, HTN, DM, hyperlipidemia, admitted 11/25 with slurred speech, confusion, lethargy. 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 CTA neck and then IR angiogram > severe B carotid disease. It was hoped he would be candidate for CIR after PEG placement for dysphagia. He has experienced progressive altered MS and lethargy since 12/2. A repeat Head CT 12/2 did not show new bleed or edema. No evidence seizure activity noted. Ammonia was slightly elevated and empiric lactulose given.   SUBJECTIVE:  Fevers persist, on cooling blanket. Cx remain neg to date  No changes in neurostatus, no purposeful movement , unresponsive  Weaning on vent this am.  Vomited overnight w/ loose stools . (on lactulose)   VITAL SIGNS: BP (!) 149/80   Pulse 65   Temp (!) 100.7 F (38.2 C) (Rectal)   Resp 17   Ht 5\' 8"  (1.727 m)   Wt 91 kg (200 lb 9.9 oz)   SpO2 97%   BMI 30.50 kg/m   HEMODYNAMICS:    VENTILATOR SETTINGS: Vent Mode: PRVC FiO2 (%):  [30 %] 30 % Set Rate:  [16 bmp] 16 bmp Vt Set:  [570 mL] 570 mL PEEP:  [5 cmH20] 5 cmH20 Plateau Pressure:  [15 cmH20-22 cmH20] 22 cmH20  INTAKE / OUTPUT: I/O last 3 completed shifts: In: 4937.2 [I.V.:2217.2; NG/GT:2520; IV Piggyback:200] Out: 2950 [Urine:2950]  PHYSICAL EXAMINATION: General: Sedated, intubated Neuro: no changes, does not fc, int agtation HEENT:  PERR Cardiovascular:  s1 s2 RRR Lungs:  Coarse BS w/ no wheezing  Abdomen:  Soft,  bowel sounds, no r/g Musculoskeletal:  No edema, no deformities Skin:  warm  LABS:  BMET  Recent Labs Lab 11/02/16 0315 11/03/16 0258 11/04/16 0313  NA 142 140 137  K 3.8 4.2 4.6  CL 109 101 101  CO2 27 29 29   BUN  14 8 9   CREATININE 0.62 0.70 0.54*  GLUCOSE 161* 149* 179*    Electrolytes  Recent Labs Lab 11/02/16 0315 11/03/16 0258 11/04/16 0313  CALCIUM 8.5* 8.9 9.2    CBC  Recent Labs Lab 11/01/16 0210 11/02/16 0315 11/04/16 0313  WBC 15.6* 11.8* 19.3*  HGB 13.0 11.4* 12.4*  HCT 40.2 34.6* 36.1*  PLT 250 237 289    Coag's  Recent Labs Lab 10/28/16 1440 11/01/16 0210  APTT 30  --   INR 0.98 1.05    Sepsis Markers  Recent Labs Lab 10/28/16 1440 10/28/16 1632 10/30/16 1006 10/31/16 0252 11/01/16 0210  LATICACIDVEN 0.9 0.9  --   --   --   PROCALCITON <0.10  --  0.24 <0.10 <0.10    ABG  Recent Labs Lab 10/30/16 0915 10/31/16 0416 11/01/16 0500  PHART 7.319* 7.420 7.380  PCO2ART 54.7* 42.6 48.5*  PO2ART 171* 110* 134*    Liver Enzymes  Recent Labs Lab 10/28/16 1440 10/30/16 0606  AST 20 27  ALT 33 35  ALKPHOS 75 71  BILITOT 0.5 0.8  ALBUMIN 3.2* 3.0*    Cardiac Enzymes No results for input(s): TROPONINI, PROBNP in the last 168 hours.  Glucose  Recent Labs Lab 11/03/16 0742 11/03/16 1227 11/03/16 1555 11/03/16 1941 11/03/16 2305 11/04/16 0427  GLUCAP 202* 190* 209* 230* 247* 161*  Imaging No results found.   STUDIES:  EEG 11/28 and 29 >> no seizure activity or focus. Diffuse slow activity MRI Brain >>  Cerebral angiogram 11/27 >> B ICA stenosis, 100% R, 99% on L 12/4 MRI as noted 12/5 LP done  CULTURES: Blood 12/2 >> coag neg staph Blood 12/3 >> NEG  Sputum 12/4>>norm resp flora  12/5 LP>>>NEG   ANTIBIOTICS: Zosyn 12/4 >> 12/5 Ceftaz 12/5>>12/7 vanc 12/5>>12/7  SIGNIFICANT EVENTS: 12/5 biting ETT, fevers  LINES/TUBES: ETT 12/4 >>   DISCUSSION: 50 yo man with HTN, DM, EtOH abuse experience d progressive lethargy and resp failure in setting B frontal CVA's. Course has been consistent with progressive poor airway management. No clear evidence for PNA although he did have fever am 12/4. Intubated and empiric abx  added. MRI on 12/4 shows progressive enlargement of large left frontal lobe infarct. Suspect that this is progression of this original insult, portends long recovery period to unclear plateau. These factors may make early trach / PEG reasonable. Edgar. Still bites tube-Precedex seems to be helping. Fevers persistent .   ASSESSMENT / PLAN:  PULMONARY A: Acute resp failure due to poor airway protection, neurological process Consider aspiration event / PNA, but CXR and course not compelling for this. Fever noted 12/4  Hx tobacco use  P:   needs trach vs no trach option extubation next week Wan cpap 5 ps5 as goal, may need PS increase Check CXR in am    NEUROLOGIC A:   Bilateral large frontal CVAs Progressive lethargy and dysphagia, likely due to above. Hyperammonemia was considered earlier in course At risk EtOH withdrawal No evidence seizure activity biting ETT-improved with precedex  MRI 12/4 w/ progressive enlargement of large left frontal lobe infarct  P:   RASS goal: -1 to 0 ASA 325mg  Thiamine + folate  keppra bid Modafinil  fent Cont  precedex  CARDIOVASCULAR A:  HTN Hyperlipidemia P:  BP control goal SBP  140 - 170  Statin    RENAL  Recent Labs Lab 11/02/16 0315 11/03/16 0258 11/04/16 0313  K 3.8 4.2 4.6     A:   No acute issues P:   Follow BMP am Fluids per neuro  GASTROINTESTINAL A:   SUP Nutrition  Diarrhea  Elevated Ammonia /ETOH use  P:   protonix > changed to pepcid 12/4 am TF Change Miralax to As needed   lactulose  HEMATOLOGIC A:    DVT prophylaxis P:  On enoxaparin   INFECTIOUS A:   Fever-persistent fever spike  since 12/4  Possible aspiration PNA cx neg to date .   P:   Fever neuro? Tylenal, liit to 1-2 grams a day with etoh hx  Check cxr and Lactate in am . If elevated consider repeat Cx.  Cont Cooling blanket     ENDOCRINE CBG (last 3)   Recent Labs  11/03/16 1941 11/03/16 2305 11/04/16 0427  GLUCAP 230*  247* 161*     A:   DM, contolled P:   lantus (low 140) + SSI TF coverage   FAMILY  - Updates: Updated wife at bedside am 12/5  - Inter-disciplinary family meet or Palliative Care meeting due by:  11/06/16   Tammy Parrett NP-C   Pulmonary and Critical Care  [phone]   11/04/2016 7:34 AM

## 2016-11-04 NOTE — Progress Notes (Signed)
Sputum sample sent to lab per MD order. RT will continue to monitor

## 2016-11-04 NOTE — Progress Notes (Signed)
Zofran given to pt. and residuals checked. Pt. had 200 ml of tan colored residuals. Will continue to monitor and assess closely.  Justin Mend, RN

## 2016-11-04 NOTE — Progress Notes (Signed)
Pharmacy Antibiotic Note Quentin Gerrior is a 50 y.o. male admitted on 10/17/2016 with AMS and developed respiratory failure requiring intubation on 12/4. Pt with worsening leukocytosis and fever over last 48 hours and  pharmacy has been consulted for Zosyn and vancomycin dosing.  Plan: Vancomycin 1000 IV every 8 hours.  Goal trough 15-20 mcg/mL. Zosyn 3.375g IV q8h (4 hour infusion).  SCr every 72 hours while on vancomycin   Height: 5\' 8"  (172.7 cm) Weight: 200 lb 9.9 oz (91 kg) IBW/kg (Calculated) : 68.4  Temp (24hrs), Avg:100.7 F (38.2 C), Min:100.7 F (38.2 C), Max:100.7 F (38.2 C)   Recent Labs Lab 10/28/16 1440 10/28/16 1632 10/29/16 0518  10/30/16 0606 10/31/16 0252 11/01/16 0210 11/02/16 0315 11/03/16 0258 11/04/16 0313  WBC 11.6*  --  14.8*  --  17.8*  --  15.6* 11.8*  --  19.3*  CREATININE 0.69  --   --   < > 0.85 0.69 0.64 0.62 0.70 0.54*  LATICACIDVEN 0.9 0.9  --   --   --   --   --   --   --   --   < > = values in this interval not displayed.  Estimated Creatinine Clearance: 123.6 mL/min (by C-G formula based on SCr of 0.54 mg/dL (L)).    No Known Allergies  Antimicrobials this admission:   Zosyn 12/4 >> 12/5,  12/9 >>  Vancomycin 12/5>>12/6,  12/9 >>  Ceftaz 12/5>>12/7   Microbiology results:  12/2 BCx: ngF 12/3 BCx2: CoNS 1/2 12/4 TA: normal resp flora 12/4 MRSA PCR: neg 12/5 CSF: ngF 12/5 LP fungus: ngtd 12/9 BCx: px 12/9 UCx: px  Thank you for allowing pharmacy to be a part of this patient's care.  Vincenza Hews, PharmD, BCPS 11/04/2016, 2:02 PM Pager: [phone]

## 2016-11-04 NOTE — Progress Notes (Signed)
Pt. had a second episode of emesis. MD called and RN instructed to give PRN anti-emetic and to follow-up if nausea unresolved with anti-emetic.  Justin Mend, RN

## 2016-11-04 NOTE — Progress Notes (Signed)
STROKE TEAM PROGRESS NOTE   SUBJECTIVE (INTERVAL HISTORY) No family is at the bedside. No significant neuro changes from yesterday. Off fentanyl this am but on stimulation, continued to cough and gag as well as posturing at b/l UEs. Not tolerating weaning yesterday, continued to have apneic episodes. Currently on weaning trials again. Still has low grade fever.   OBJECTIVE Temp:  [100.7 F (38.2 C)] 100.7 F (38.2 C) (12/08 2000) Pulse Rate:  [58-84] 64 (12/09 0800) Cardiac Rhythm: Normal sinus rhythm (12/09 0400) Resp:  [15-24] 23 (12/09 0800) BP: (99-165)/(65-125) 140/78 (12/09 0800) SpO2:  [92 %-100 %] 95 % (12/09 0800) FiO2 (%):  [30 %] 30 % (12/09 0800) Weight:  [91 kg (200 lb 9.9 oz)] 91 kg (200 lb 9.9 oz) (12/09 0500)  CBC:   Recent Labs Lab 10/28/16 1440  11/02/16 0315 11/04/16 0313  WBC 11.6*  < > 11.8* 19.3*  NEUTROABS 8.4*  --   --   --   HGB 16.3  < > 11.4* 12.4*  HCT 45.4  < > 34.6* 36.1*  MCV 88.3  < > 92.3 90.3  PLT 251  < > 237 289  < > = values in this interval not displayed.  Basic Metabolic Panel:   Recent Labs Lab 11/03/16 0258 11/04/16 0313  NA 140 137  K 4.2 4.6  CL 101 101  CO2 29 29  GLUCOSE 149* 179*  BUN 8 9  CREATININE 0.70 0.54*  CALCIUM 8.9 9.2    Lipid Panel:     Component Value Date/Time   CHOL 172 10/22/2016 1425   TRIG 157 (H) 10/22/2016 1425   HDL 51 10/22/2016 1425   CHOLHDL 3.4 10/22/2016 1425   VLDL 31 10/22/2016 1425   LDLCALC 90 10/22/2016 1425   HgbA1c:  Lab Results  Component Value Date   HGBA1C 8.5 (H) 10/22/2016   Urine Drug Screen:     Component Value Date/Time   LABOPIA NONE DETECTED 10/20/2016 1437   COCAINSCRNUR NONE DETECTED 10/20/2016 1437   LABBENZ NONE DETECTED 10/20/2016 1437   AMPHETMU NONE DETECTED 10/20/2016 1437   THCU NONE DETECTED 10/20/2016 1437   LABBARB NONE DETECTED 10/20/2016 1437      IMAGING I have personally reviewed the radiological images below and agree with the radiology  interpretations.  MRI 10/20/16 1. Area of diffusion restriction and hyperintense T2 weighted signal within the right frontal white matter. The distribution is not typical of ischemia and the appearance is concerning for a neoplastic process, such as a low grade glioma. Tumefactive demyelinating disease is also a consideration, though if the patient's symptoms are truly acute, some degree of contrast enhancement would be expected. Short-term follow-up with brain MRI with and without contrast in 4-6 weeks is recommended, as change over time may be helpful in diagnosis. Additionally, CSF sampling should be considered. 2. No hemorrhage or mass effect.  MRI 10/22/16 1. Emergent Anterior Circulation Large Vessel Occlusions - both Internal Carotid Arteries likely affected. Progressive bilateral anterior circulation infarcts, newly involving the left MCA/ACA territories. 2. No associated hemorrhage or mass effect at this time. 3. This was discussed by telephone with Dr. Elpidio Anis On 10/22/2016 at 1037 hours. We are arranging for the patient to undergo stat CTA head and neck at the time of this dictation.  CT Head Wo Contrast 10/25/2016 Low-density in the known infarctions affecting the right frontal deep white matter in the left frontal cortical and subcortical brain. No sign of hemorrhagic transformation or significant mass effect/shift.  The seems to be a little more involvement in the left basal ganglia by today's CT.  CTA Head and Neck 10/22/2016 1. Functional occlusion of both ICA siphons with suboptimal reconstitution of flow at both ICA termini: Radiographic string sign stenosis at the right ICA origin in the neck related to bulky low-density plaque or thrombus.  The cervical Right ICA demonstrates thread-like enhancement and appears to occlude at the skullbase.  Suspected much of the Right ICA siphon is fully occluded. Tapered appearance of the cervical Left ICA without focal stenosis  in the neck. There is thread-like enhancement in the Left ICA siphon, but suspected the supraclinoid Left ICA segment is fully occluded.  Thus the tapering in the neck is likely do to distal outflow obstruction. Reconstituted flow from the posterior communicating arteries, which unfortunately are diminutive. No ACA or MCA occlusion identified, but the anterior circulation appears diminutive throughout. 2. Preliminary report of the above was discussed with Dr. Elpidio Anis at 1138 hours. 3. Posterior circulation is largely normal. There does appear to be a moderate or severe atherosclerotic stenosis at the right vertebral artery origin, but no other posterior circulation stenosis. 4. Expected CT appearance of evolving bilateral anterior circulation infarcts. No associated hemorrhage or mass effect. 5. No other abnormality in the neck. Mild emphysema in the lung apices.  Cerebral Angiogram 10/23/2016 Angiographically occluded internal carotid arteries in the cavernous segments at the level of the ophthalmic arteries bilaterally, without reconstitution from the external carotid artery branches. Pre occlusive string sign of the right internal carotid artery proximally extending to the occlusion in the cavernous segment as described above. Approximally 70% stenoses of the left internal carotid artery at the bulb again with a diminutive left internal carotid artery distal to this to its occluded cavernous segment. Approximately 85-90% stenoses at the origin of the right vertebral artery. Retrograde opacification of the anterior cerebral artery distribution bilaterally from the P3 leptomeningeal branches, and from the thalamic perforators from the P2 segment of the posterior cerebral arteries. Occluded pericallosal and callosal marginal branches of the anterior cerebral arteries at the level of the head of the corpus callosum.  CT Head Wo Contrast 10/28/2016 Expected evolution of left ACA and right MCA  territory infarcts without evidence of hemorrhagic transformation, new infarct or other new acute intracranial abnormality.  EEG   Diffuse slow activity, max over left temporal regions some improvement over the course of the recording.   EEG 10/31/16 This EEG is abnormal due to markedly diffuse background slowing. Clinical Correlation of the above findings indicates severe diffuse cerebral dysfunction that is non-specific in etiology and can be seen with hypoxic/ischemic injury, toxic/metabolic encephalopathies, neurodegenerative disorders, or medication effect.  The absence of epileptiform discharges does not rule out a clinical diagnosis of epilepsy.  Clinical correlation is advised.  Dg Chest Port 1 View 11/02/2016 Endotracheal tube and nasogastric tube well position.  Slight worsening of lower lobe atelectasis.  Mr Brain Limited Wo Contrast 10/30/2016 IMPRESSION: Subacute moderate-size right frontal lobe subcortical infarct. Overall size relatively similar prior exam. Compared with 10/22/2016 MR, progressive enlargement of large left frontal lobe infarct now involving portions of the left caudate head and lenticular nucleus. Small area of extension to the left sub insular region. Local mass effect. Gradient and T1 imaging was not performed however, findings are suspicious for the presence of small amount of hemorrhage with these infarcts. Occluded internal carotid arteries with reconstitution of supraclinoid aspect which appears suboptimal given the infarcts. Slow flow within middle cerebral  artery branches bilaterally. Prominent opacification/ mucosal thickening paranasal sinuses most notable right maxillary sinus and ethmoid sinus air cells bilaterally.   PHYSICAL EXAM  Temp:  [100.7 F (38.2 C)] 100.7 F (38.2 C) (12/08 2000) Pulse Rate:  [58-84] 64 (12/09 0800) Resp:  [15-24] 23 (12/09 0800) BP: (99-165)/(65-125) 140/78 (12/09 0800) SpO2:  [92 %-100 %] 95 % (12/09 0800) FiO2 (%):   [30 %] 30 % (12/09 0800) Weight:  [91 kg (200 lb 9.9 oz)] 91 kg (200 lb 9.9 oz) (12/09 0500)  General - Well nourished, well developed, intubated and no sedation.   Ophthalmologic - Fundi not visualized due to upward gaze and not able to position the ophthoscope.  Cardiovascular - Regular rhythm and rate  Neuro - intubated and on vent, not on sedation. Not open eyes on voice, but on pain stimulation, pt tries to open eyes and barely opened his eyes. PERRL, upward gaze with disconjugation of eyes, sluggish doll's eye present, positive corneal and gag. On pain stimulation, BUE extension posturing, increase muscle tone bilaterally, LLE mild withdraw and RLE increased muscle tone with minimal movement. DTR diminished and no babinski. Sensation, coordination and gait not tested.    ASSESSMENT/PLAN Mr. Tade Ostertag is a 50 y.o. male with history of  newly diagnosed bilateral strokes, DM2, ETOH abuse HLD and HTN  presenting with confusion, L sided numbness and tingling x 2 weeks, neuro consulted for stroke and possible seizure.   Stroke:  Progressive bilateral anterior circulation infarcts in setting of bilateral ICA occlusion - Has uncontrolled DM II, HTN, alcohol and smoking R anterior circulation subacute infarct  1-2 weeks prior to PTA New progressive L MCA and ACA infarcts   Resultant  R sided weakness, AMS, respiratory failure  MRI head 10/21/15 - right MCA infarct   MRI head 10/22/16 - Progressive b/l anterior circulation infarcts, right MCA extension and L MCA, ACA and MCA/ACA infarct.  Repeat MRI 10/30/16 - progressive extension of right MCA, ACA infarcts - could be the cause of worsening mental status vs. Transient hypotension post intubation  CTA head and neck - right ICA occlusion at origin and left ICA occlusion at siphon with bilateral distal reconstitution from PCOM.  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 No cardiac source of emboli identified.  LDL 90  HgbA1c 8.5  Lovenox for VTE prophylaxis Diet NPO time specified Except for: Sips with Meds  No antithrombotic prior to admission, now on aspirin 325 mg daily. plavix on hold for potential incoming procedures  On provigil now for lethagy  Ongoing aggressive stroke risk factor management  Therapy recommendations: Pending  Disposition:  pending (lives w/ wife, works as a Patent examiner)  Family not sure about further plan, they would like to see if he is able to tolerate weaning. If able to be off vent after trach, they are leaning towards trach.    Bilateral severe carotid disease  Could be associated with long term uncontrolled DM, HTN and smoking  Cerebra angio as above  Has plan for right VA stent in the future if pt has reasonable recovery  BP goal 140-160  Avoid hypotension  Fever   Leukocytosis 14.7-11.6-14.8-17.8 - 11.8 - 19.3  BCx pending - 1/4 growing staph but coagulase negative - likely contamination  Sputum culture pending  UA 10/28/16 negative  CXR 11/02/2016 - Slight worsening of lower lobe atelectasis.  CSF no sign of CNS infection.  Currently off antibiotics - CCM following - cultures no growth  T Max 100.7 Saturday  ? Seizure   Off valproic acid due to high ammonia.  On keppra 500mg  bid  EEG diffuse slowing   Repeat EEG diffuse slowing and no seizure  Hyperammonemia  Ammonia level  [id number] - 41  Currently off lactulose  Ammonia level tomorrow again  Hypertension       Permissive hypertension (OK if < 220/120)   Long-term BP goal 140-160  Avoid hypotension  BP still fluctuating due to sedation  Hyperlipidemia  Home meds:  lipitor 10  Lipitor increased to 80 in hospital  LDL 90, goal < 70  Continue statin at discharge  Diabetes  HgbA1c 8.5  CBG still high  SSI  Increase lantus to 15u  Alcohol abuse  Heavy drinker  per wife  Recent fall on 11/9 after birthday party  [place] again last week  Off CIWA protocol   Dysphagia  Secondary to stroke  On TF @ 70cc/h  Plan for PEG along with trach if decided  Tobacco abuse  Current smoker  Smoking cessation counseling will be provided  Other Stroke Risk Factors  Overweight, Body mass index is 30.5 kg/m., recommend weight loss, diet and exercise as appropriate  Obstructive sleep apnea, inconsistent use of CPAP at home.   Other Problems    Hospital day # 14  This patient is critically ill due to bilateral stroke, bilateral carotid occlusion, right VA stenosis, fever, seizure, hyperammonemia and at significant risk of neurological worsening, death form recurrent stroke, hemorrhagic transformation, status epilepticus, sepsis, shock and brain death. This patient's care requires constant monitoring of vital signs, hemodynamics, respiratory and cardiac monitoring, review of multiple databases, neurological assessment, discussion with family, other specialists and medical decision making of high complexity. I spent 35 minutes of neurocritical care time in the care of this patient.  Rosalin Hawking, MD PhD Stroke Neurology 11/04/2016 1:03 PM

## 2016-11-04 NOTE — Progress Notes (Signed)
Pt. had one episode of emesis. MD aware. Instructed to monitor.  Justin Mend, RN

## 2016-11-05 ENCOUNTER — Inpatient Hospital Stay (HOSPITAL_COMMUNITY): Payer: 59

## 2016-11-05 DIAGNOSIS — I639 Cerebral infarction, unspecified: Secondary | ICD-10-CM

## 2016-11-05 DIAGNOSIS — Z515 Encounter for palliative care: Secondary | ICD-10-CM

## 2016-11-05 DIAGNOSIS — R509 Fever, unspecified: Secondary | ICD-10-CM

## 2016-11-05 DIAGNOSIS — Z7189 Other specified counseling: Secondary | ICD-10-CM

## 2016-11-05 LAB — BASIC METABOLIC PANEL
ANION GAP: 9 (ref 5–15)
BUN: 6 mg/dL (ref 6–20)
CHLORIDE: 98 mmol/L — AB (ref 101–111)
CO2: 28 mmol/L (ref 22–32)
Calcium: 8.7 mg/dL — ABNORMAL LOW (ref 8.9–10.3)
Creatinine, Ser: 0.54 mg/dL — ABNORMAL LOW (ref 0.61–1.24)
GFR calc Af Amer: >60 mL/min (ref >60)
GFR calc non Af Amer: >60 mL/min (ref >60)
GLUCOSE: 307 mg/dL — AB (ref 65–99)
POTASSIUM: 4.2 mmol/L (ref 3.5–5.1)
Sodium: 135 mmol/L (ref 135–145)

## 2016-11-05 LAB — GLUCOSE, CAPILLARY
GLUCOSE-CAPILLARY: 282 mg/dL — AB (ref 65–99)
GLUCOSE-CAPILLARY: 304 mg/dL — AB (ref 65–99)
GLUCOSE-CAPILLARY: 308 mg/dL — AB (ref 65–99)
GLUCOSE-CAPILLARY: 241 mg/dL — AB (ref 65–99)

## 2016-11-05 LAB — CBC
HEMATOCRIT: 35.1 % — AB (ref 39.0–52.0)
Hemoglobin: 12 g/dL — ABNORMAL LOW (ref 13.0–17.0)
MCH: 30.5 pg (ref 26.0–34.0)
MCHC: 34.2 g/dL (ref 30.0–36.0)
MCV: 89.1 fL (ref 78.0–100.0)
PLATELETS: 298 10*3/uL (ref 150–400)
RBC: 3.94 MIL/uL — AB (ref 4.22–5.81)
RDW: 12.8 % (ref 11.5–15.5)
WBC: 15.5 10*3/uL — AB (ref 4.0–10.5)

## 2016-11-05 LAB — LACTIC ACID, PLASMA
Lactic Acid, Venous: 0.9 mmol/L (ref 0.5–1.9)
Lactic Acid, Venous: 1 mmol/L (ref 0.5–1.9)

## 2016-11-05 LAB — AMMONIA: Ammonia: 40 umol/L — ABNORMAL HIGH (ref 9–35)

## 2016-11-05 LAB — PROCALCITONIN: PROCALCITONIN: 0.14 ng/mL

## 2016-11-05 MED ORDER — FENTANYL BOLUS VIA INFUSION
100.0000 ug | INTRAVENOUS | Status: DC | PRN
  Administered 2016-11-05 (×3): 100 ug via INTRAVENOUS
  Filled 2016-11-05: qty 100

## 2016-11-05 MED ORDER — LORAZEPAM 2 MG/ML PO CONC: 1.0000 mg | ORAL | Status: DC | PRN

## 2016-11-05 MED ORDER — MIDAZOLAM HCL 5 MG/ML IJ SOLN
2.0000 mg/h | INTRAMUSCULAR | Status: DC
  Administered 2016-11-05: 2 mg/h via INTRAVENOUS
  Filled 2016-11-05: qty 10

## 2016-11-05 MED ORDER — ONDANSETRON 4 MG PO TBDP: 4.0000 mg | ORAL_TABLET | Freq: Four times a day (QID) | ORAL | Status: DC | PRN

## 2016-11-05 MED ORDER — HALOPERIDOL LACTATE 2 MG/ML PO CONC
0.5000 mg | ORAL | Status: DC | PRN
  Filled 2016-11-05: qty 0.3

## 2016-11-05 MED ORDER — LORAZEPAM 1 MG PO TABS: 1.0000 mg | ORAL_TABLET | ORAL | Status: DC | PRN

## 2016-11-05 MED ORDER — MIDAZOLAM HCL 2 MG/2ML IJ SOLN
1.0000 mg | INTRAMUSCULAR | Status: DC | PRN
  Administered 2016-11-05 (×2): 2 mg via INTRAVENOUS
  Filled 2016-11-05 (×2): qty 2

## 2016-11-05 MED ORDER — MORPHINE SULFATE (CONCENTRATE) 10 MG/0.5ML PO SOLN
5.0000 mg | ORAL | Status: DC | PRN
  Filled 2016-11-05: qty 0.5

## 2016-11-05 MED ORDER — GLYCOPYRROLATE 0.2 MG/ML IJ SOLN: 0.2000 mg | INTRAMUSCULAR | Status: DC

## 2016-11-05 MED ORDER — LORAZEPAM 1 MG PO TABS
1.0000 mg | ORAL_TABLET | ORAL | Status: DC | PRN
Start: 2016-11-05 — End: 2016-11-05

## 2016-11-05 MED ORDER — LORAZEPAM 2 MG/ML IJ SOLN: 2.0000 mg | INTRAMUSCULAR | Status: DC | PRN

## 2016-11-05 MED ORDER — MORPHINE SULFATE (CONCENTRATE) 10 MG/0.5ML PO SOLN
5.0000 mg | ORAL | Status: DC | PRN
Start: 2016-11-05 — End: 2016-11-05

## 2016-11-05 MED ORDER — FENTANYL CITRATE (PF) 100 MCG/2ML IJ SOLN
25.0000 ug | INTRAMUSCULAR | Status: DC | PRN
  Administered 2016-11-05 (×2): 100 ug via INTRAVENOUS
  Filled 2016-11-05 (×2): qty 2

## 2016-11-05 MED ORDER — INSULIN ASPART 100 UNIT/ML ~~LOC~~ SOLN
0.0000 [IU] | SUBCUTANEOUS | Status: DC
  Administered 2016-11-05 (×2): 15 [IU] via SUBCUTANEOUS

## 2016-11-05 MED ORDER — LORAZEPAM 2 MG/ML IJ SOLN: 2.0000 mg | Freq: Once | INTRAMUSCULAR | Status: AC

## 2016-11-05 MED ORDER — GLYCOPYRROLATE 0.2 MG/ML IJ SOLN
0.2000 mg | INTRAMUSCULAR | Status: DC
  Administered 2016-11-05: 0.2 mg via INTRAVENOUS
  Filled 2016-11-05: qty 1

## 2016-11-05 MED ORDER — FENTANYL 2500MCG IN NS 250ML (10MCG/ML) PREMIX INFUSION
75.0000 ug/h | INTRAVENOUS | Status: DC
  Administered 2016-11-05: 20 ug/h via INTRAVENOUS
  Filled 2016-11-05: qty 250

## 2016-11-05 MED ORDER — ONDANSETRON HCL 4 MG/2ML IJ SOLN: 4.0000 mg | Freq: Four times a day (QID) | INTRAMUSCULAR | Status: DC | PRN

## 2016-11-05 MED ORDER — ACETAMINOPHEN 650 MG RE SUPP: 650.0000 mg | Freq: Four times a day (QID) | RECTAL | Status: DC | PRN

## 2016-11-05 MED ORDER — FENTANYL BOLUS VIA INFUSION
20.0000 ug | INTRAVENOUS | Status: DC | PRN
  Filled 2016-11-05: qty 20

## 2016-11-05 MED ORDER — LORAZEPAM 2 MG/ML IJ SOLN
1.0000 mg | INTRAMUSCULAR | Status: DC | PRN
  Filled 2016-11-05: qty 1

## 2016-11-05 MED ORDER — MORPHINE SULFATE (CONCENTRATE) 10 MG/0.5ML PO SOLN
5.0000 mg | ORAL | Status: DC | PRN
  Administered 2016-11-05: 5 mg via SUBLINGUAL
  Filled 2016-11-05: qty 0.5

## 2016-11-05 MED ORDER — INSULIN ASPART 100 UNIT/ML ~~LOC~~ SOLN
0.0000 [IU] | SUBCUTANEOUS | Status: DC
  Administered 2016-11-05: 5 [IU] via SUBCUTANEOUS

## 2016-11-05 MED ORDER — MIDAZOLAM BOLUS VIA INFUSION
2.0000 mg | INTRAVENOUS | Status: DC | PRN
  Filled 2016-11-05: qty 2

## 2016-11-05 MED ORDER — INSULIN NPH (HUMAN) (ISOPHANE) 100 UNIT/ML ~~LOC~~ SUSP
20.0000 [IU] | Freq: Four times a day (QID) | SUBCUTANEOUS | Status: DC
  Administered 2016-11-05: 20 [IU] via SUBCUTANEOUS
  Filled 2016-11-05: qty 10

## 2016-11-05 MED ORDER — LABETALOL HCL 5 MG/ML IV SOLN
10.0000 mg | INTRAVENOUS | Status: DC | PRN
  Administered 2016-11-05 (×3): 10 mg via INTRAVENOUS
  Filled 2016-11-05 (×3): qty 4

## 2016-11-05 MED ORDER — HYDRALAZINE HCL 20 MG/ML IJ SOLN: 10.0000 mg | Freq: Four times a day (QID) | INTRAMUSCULAR | Status: DC | PRN

## 2016-11-05 MED ORDER — INSULIN NPH (HUMAN) (ISOPHANE) 100 UNIT/ML ~~LOC~~ SUSP
30.0000 [IU] | Freq: Four times a day (QID) | SUBCUTANEOUS | Status: DC
  Filled 2016-11-05: qty 10

## 2016-11-05 MED ORDER — GLYCOPYRROLATE 1 MG PO TABS: 1.0000 mg | ORAL_TABLET | ORAL | Status: DC

## 2016-11-05 MED ORDER — HALOPERIDOL LACTATE 5 MG/ML IJ SOLN: 0.5000 mg | INTRAMUSCULAR | Status: DC | PRN

## 2016-11-05 MED ORDER — HALOPERIDOL 0.5 MG PO TABS
0.5000 mg | ORAL_TABLET | ORAL | Status: DC | PRN
  Filled 2016-11-05: qty 1

## 2016-11-05 MED ORDER — LABETALOL HCL 5 MG/ML IV SOLN
10.0000 mg | INTRAVENOUS | Status: DC | PRN
  Administered 2016-11-05: 10 mg via INTRAVENOUS
  Filled 2016-11-05: qty 4

## 2016-11-05 MED ORDER — ACETAMINOPHEN 325 MG PO TABS: 650.0000 mg | ORAL_TABLET | Freq: Four times a day (QID) | ORAL | Status: DC | PRN

## 2016-11-05 MED ORDER — POLYVINYL ALCOHOL 1.4 % OP SOLN
1.0000 [drp] | Freq: Four times a day (QID) | OPHTHALMIC | Status: DC | PRN
  Filled 2016-11-05: qty 15

## 2016-11-05 MED ORDER — DIPHENHYDRAMINE HCL 50 MG/ML IJ SOLN: 12.5000 mg | INTRAMUSCULAR | Status: DC | PRN

## 2016-11-05 MED ORDER — MORPHINE SULFATE (CONCENTRATE) 10 MG/0.5ML PO SOLN: 20.0000 mg | ORAL | Status: DC | PRN

## 2016-11-06 ENCOUNTER — Telehealth: Payer: Self-pay

## 2016-11-06 LAB — GLUCOSE, CAPILLARY: Glucose-Capillary: 243 mg/dL — ABNORMAL HIGH (ref 65–99)

## 2016-11-06 NOTE — Telephone Encounter (Signed)
On 11/06/2016 I received a death certificate from [facility] (original). The death certificate is for cremation. The patient is a patient of [facility]. The death certificate will be taken to Pulmonary Unit @ [facility] tomorrow am for signature.  On 2016/11/30 I received the death certificate back from [place]. I got the death certificate ready and called the funeral home to let them know the death certificate is ready for pickup. I also faxed a copy to the funeral home per the funeral home request.

## 2016-11-07 LAB — CULTURE, RESPIRATORY

## 2016-11-07 LAB — CULTURE, RESPIRATORY W GRAM STAIN

## 2016-11-07 LAB — CULTURE, BLOOD (ROUTINE X 2)

## 2016-11-09 LAB — CULTURE, BLOOD (ROUTINE X 2): CULTURE: NO GROWTH

## 2016-11-13 ENCOUNTER — Encounter (HOSPITAL_COMMUNITY): Payer: Self-pay | Admitting: Radiology

## 2016-11-21 LAB — CULTURE, FUNGUS WITHOUT SMEAR: SPECIAL REQUESTS: NORMAL

## 2016-11-27 NOTE — Progress Notes (Signed)
VASCULAR LAB PRELIMINARY  PRELIMINARY  PRELIMINARY  PRELIMINARY  Bilateral lower extremity venous duplex completed.    Preliminary report:  There is no DVT or SVT noted in the bilateral lower extremities.   Salina Stanfield, RVT 12/04/2016, 12:17 PM

## 2016-11-27 NOTE — Plan of Care (Addendum)
MR repeat showed extensive progressive new infarct at bilateral ACA and large left MCA territory, even with relatively good blood pressure control over the last several days. However, he continued to have fever and hyperglycemia, which may partially contribute to the worsening of his clinical picture.  I had long discussion again with patient wife and father, as well as mother and stepfather. Reviewing pictures with them, updated them regarding current condition, treatment options and likely very poor prognosis. At this time, patient meaningful recovery is very unlikely. Wife still has difficulty to make decision at this time. I offered pattern care consultation and the wife would like to proceed.   Rosalin Hawking, MD PhD Stroke Neurology November 06, 2016 12:59 PM

## 2016-11-27 NOTE — Progress Notes (Signed)
Pt transported to and from 3M05 to MRI 2 on ventilator. Pt stable throughout with no complications. Pt with copious oral and ett secretions throughout MRI trip. RT will continue to monitor.

## 2016-11-27 NOTE — Progress Notes (Signed)
Patient experiencing elevated BP. Have alternated Fentanyl, Hydralazine, and Versed. Called eLink for additional BP control. Labetalol ordered. Will continue to monitor patient.

## 2016-11-27 NOTE — Consult Note (Signed)
Consultation Note Date: 2016-11-16   Patient Name: Scott Harris  DOB: December 12, 1965  MRN: [id number]  Age / Sex: 51 y.o., male  PCP: [facility] Referring Physician: Collene Gobble, MD  Reason for Consultation: Withdrawal of life-sustaining treatment  HPI/Patient Profile: 51 y.o. male  with past medical history of DM2, HTN, HLD  admitted on 10/26/2016 with slurred speech, lethargy and confusion. Initial workup indicated a mass presumed to be a possible glioma. 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 He has been intubated and is now on day 15 of his admission. Repeat MRI shows new R acute anterior cerebral artery territory, new L anterior cerebral artery territory infarct including the left basal ganglia, cerebral edema on the left with 59m midline shift to the right. Palliative medicine consulted to discuss withdrawal of life sustaining treatment.    Clinical Assessment and Goals of Care: \Met with patient's wife [name] She notes that as discussed with neurology there is little hope for meaningful recovery in patient's status. She desires to keep him comfortable and withdraw life sustaining measures. Discussed with her the process would include stopping all interventions that were not focused on comfort including tube feeding, IV fluids, and life prolonging medications. AEstill Bambergagrees that patient would not want to continue to live in this state and desires to agree with plan to proceed with comfort measures only. Patient was very active and enjoyed sports, spending time with his wife listening to music and caring for his step children.   Primary Decision Maker NEXT OF KIN - spouse- [name]   SUMMARY OF RECOMMENDATIONS -Terminal extubation -Versed 260mhr continuous  infusion with prn 34m1molus and fentanyl 65m68mr infusion with prn 100 mcg/hr bolus for palliative sedation and SOB or agitation symptom management -.34mg 33minul IV scheduled q4 hours for secretions -lorazepam 1mg I39m4hr prn agitation -haloperidol .5mg IV79m4 hours prn agitation    Code Status/Advance Care Planning:  DNR    Symptom Management:   As above  Palliative Prophylaxis:   Aspiration, Delirium Protocol and Frequent Pain Assessment  Additional Recommendations (Limitations, Scope, Preferences):  Full Comfort Care  Psycho-social/Spiritual:   Desire for further Chaplaincy support:No  Additional Recommendations: Caregiving  Support/Resources  Prognosis:    Hours - Days  Discharge Planning: Anticipated Hospital Death  Primary Diagnoses: Present on Admission: . CVA (cerebral vascular accident) (HCC) . [place] hypertension with goal blood pressure less than 130/80 . Type 2 diabetes mellitus with complication (HCC)   I have reviewed the medical record, interviewed the patient and family, and examined the patient. The following aspects are pertinent.  Past Medical History:  Diagnosis Date  . Diabetes mellitus without complication (HCC)   [place]   . Hypertension    Social History   Social History  . Marital status: Divorced    Spouse name: N/A  . Number of children: N/A  . Years of education:  N/A   Social History Main Topics  . Smoking status: Current Every Day Smoker    Packs/day: 1.50    Years: 20.00    Types: Cigarettes  . Smokeless tobacco: Never Used  . Alcohol use Yes     Comment: Socially   . Drug use: No  . Sexual activity: Not Asked   Other Topics Concern  . None   Social History Narrative  . None   History reviewed. No pertinent family history. Scheduled Meds: . glycopyrrolate  1 mg Oral Q4H   Or  . glycopyrrolate  0.2 mg Subcutaneous Q4H   Or  . glycopyrrolate  0.2 mg Intravenous Q4H   Continuous Infusions: .  fentaNYL infusion INTRAVENOUS    . midazolam (VERSED) infusion     PRN Meds:.acetaminophen **OR** acetaminophen, diphenhydrAMINE, fentaNYL, haloperidol **OR** haloperidol **OR** haloperidol lactate, LORazepam **OR** LORazepam **OR** LORazepam, midazolam, morphine CONCENTRATE **OR** morphine CONCENTRATE, ondansetron **OR** ondansetron (ZOFRAN) IV, polyvinyl alcohol Medications Prior to Admission:  Prior to Admission medications   Medication Sig Start Date End Date Taking? Authorizing Provider  atorvastatin (LIPITOR) 10 MG tablet Take 5 mg by mouth daily.   Yes Historical Provider, MD  CIALIS 5 MG tablet TAKE ONE TABLET (5 MG TOTAL) BY MOUTH DAILY AS NEEDED FOR ERECTILE DYSFUNCTION. 08/17/16  Yes Historical Provider, MD  dexamethasone (DECADRON) 4 MG tablet Take 2 tablets (8 mg total) by mouth 2 (two) times daily. 10/20/16  Yes Charlesetta Shanks, MD  FLUoxetine (PROZAC) 20 MG capsule Take 20 mg by mouth every morning.  09/04/16  Yes Historical Provider, MD  gabapentin (NEURONTIN) 300 MG capsule Take 600 mg by mouth at bedtime.   Yes Historical Provider, MD  ibuprofen (ADVIL,MOTRIN) 200 MG tablet Take 400 mg by mouth every 4 (four) hours as needed for moderate pain.   Yes Historical Provider, MD  lansoprazole (PREVACID) 30 MG capsule Take 30 mg by mouth every morning.  06/19/16  Yes Historical Provider, MD  levETIRAcetam (KEPPRA) 500 MG tablet Take 1 tablet (500 mg total) by mouth 2 (two) times daily. 10/20/16  Yes Charlesetta Shanks, MD  lisinopril (PRINIVIL,ZESTRIL) 2.5 MG tablet Take 2.5 mg by mouth daily.   Yes Historical Provider, MD  methocarbamol (ROBAXIN) 500 MG tablet Can take up to 1-2 tabs every 6 hours PRN PAIN Patient taking differently: Take 500-1,000 mg by mouth every 6 (six) hours as needed for muscle spasms.  10/13/16  Yes Nicole Pisciotta, PA-C  omeprazole (PRILOSEC) 20 MG capsule Take 1 capsule (20 mg total) by mouth daily. 10/20/16  Yes Charlesetta Shanks, MD  oxyCODONE-acetaminophen (PERCOCET)  5-325 MG tablet Take 1 tablet by mouth every 4 (four) hours as needed. Patient taking differently: Take 1 tablet by mouth every 4 (four) hours as needed for severe pain.  10/13/16  Yes Nicole Pisciotta, PA-C  SitaGLIPtin-MetFORMIN HCl 50-1000 MG TB24 Take 1 tablet by mouth every morning. 06/19/16  Yes Historical Provider, MD  TESTIM 50 MG/5GM (1%) GEL APPLY 1 PACKET ONTO THE SKIN EACH MORNING TO SHOULDER AND UPPER ARM ALLOW TO DRY 5 MIN BEFORE DRESSI 09/15/16  Yes Historical Provider, MD   No Known Allergies Review of Systems  Unable to perform ROS: Intubated    Physical Exam  Constitutional: He appears well-developed and well-nourished. No distress.  Cardiovascular: Normal rate and regular rhythm.   Pulmonary/Chest:  Intubated with full ventilator support  Abdominal: Soft. Bowel sounds are normal.  Musculoskeletal:  Nonresponsive, does not follow commands  Neurological:  Non responsive  Skin: Skin  is warm and dry.  Nursing note and vitals reviewed.   Vital Signs: BP (!) 180/90   Pulse 86   Temp 100.3 F (37.9 C) (Rectal)   Resp 17   Ht _0  (1.727 m)   Wt 90.7 kg (199 lb 15.3 oz)   SpO2 100%   BMI 30.40 kg/m  Pain Assessment: CPOT   Pain Score: Asleep   SpO2: SpO2: 100 % O2 Device:SpO2: 100 % O2 Flow Rate: .O2 Flow Rate (L/min): 2 L/min  IO: Intake/output summary:  Intake/Output Summary (Last 24 hours) at 11-13-16 1538 Last data filed at 13-Nov-2016 1300  Gross per 24 hour  Intake             3115 ml  Output             2775 ml  Net              340 ml    LBM: Last BM Date: 11/03/16 Baseline Weight: Weight: 86.8 kg (191 lb 4.8 oz) Most recent weight: Weight: 90.7 kg (199 lb 15.3 oz)     Palliative Assessment/Data: PPS: 10%     Thank you for this consult. Palliative medicine will continue to follow and assist as needed.   Time In: 6389 Time Out:1700 Time Total: 90 minutes Greater than 50%  of this time was spent counseling and coordinating care related to  the above assessment and plan.  Signed by: Mariana Kaufman, AGNP-C Palliative Medicine    Please contact Palliative Medicine Team phone at [phone] for questions and concerns.  For individual provider: See Shea Evans

## 2016-11-27 NOTE — Progress Notes (Signed)
[facility] Progress Note Patient Name: Scott Harris DOB: 06-17-1966 MRN: [id number]   Date of Service  Nov 08, 2016  HPI/Events of Note  Hyperglycemia with blood sugars in the 300s despite lantus/q3 hour insulin and moderate SSI q4 hours.  eICU Interventions  Change to resistant scale q4hours.  Other insulin ordered unchanged     Intervention Category Intermediate Interventions: Hyperglycemia - evaluation and treatment  [place] 11-08-2016, 1:23 AM

## 2016-11-27 NOTE — Progress Notes (Signed)
Patient has had increased CBGs of - 250, 311, 282. eLink called to request orders for blood sugar control. MD gave orders to change sliding scale from moderate to resistant scale. Will continue to monitor patient closely.

## 2016-11-27 NOTE — Procedures (Signed)
Extubation Procedure Note  Patient Details:   Name: Hendrik Kor DOB: 06-28-1966 MRN: [id number]   Airway Documentation:     Evaluation  O2 sats: transiently fell during during procedure Complications: No apparent complications Patient did not tolerate procedure well. Bilateral Breath Sounds: Rhonchi, Diminished   No   Pt extubated per Withdrawal of Life protocol to Room Air. Pt unable to speak or cough post extubation. Rn and Family at bedside. RT will continue to monitor.   Jesse Sans 11/14/2016, 4:59 PM

## 2016-11-27 NOTE — Progress Notes (Signed)
PHARMACY - PHYSICIAN COMMUNICATION CRITICAL VALUE ALERT - BLOOD CULTURE IDENTIFICATION (BCID)  Results for orders placed or performed during the hospital encounter of 10/02/2016  Blood Culture ID Panel (Reflexed) (Collected: 10/29/2016  3:28 PM)  Result Value Ref Range   Enterococcus species NOT DETECTED NOT DETECTED   Listeria monocytogenes NOT DETECTED NOT DETECTED   Staphylococcus species DETECTED (A) NOT DETECTED   Staphylococcus aureus NOT DETECTED NOT DETECTED   Methicillin resistance NOT DETECTED NOT DETECTED   Streptococcus species NOT DETECTED NOT DETECTED   Streptococcus agalactiae NOT DETECTED NOT DETECTED   Streptococcus pneumoniae NOT DETECTED NOT DETECTED   Streptococcus pyogenes NOT DETECTED NOT DETECTED   Acinetobacter baumannii NOT DETECTED NOT DETECTED   Enterobacteriaceae species NOT DETECTED NOT DETECTED   Enterobacter cloacae complex NOT DETECTED NOT DETECTED   Escherichia coli NOT DETECTED NOT DETECTED   Klebsiella oxytoca NOT DETECTED NOT DETECTED   Klebsiella pneumoniae NOT DETECTED NOT DETECTED   Proteus species NOT DETECTED NOT DETECTED   Serratia marcescens NOT DETECTED NOT DETECTED   Haemophilus influenzae NOT DETECTED NOT DETECTED   Neisseria meningitidis NOT DETECTED NOT DETECTED   Pseudomonas aeruginosa NOT DETECTED NOT DETECTED   Candida albicans NOT DETECTED NOT DETECTED   Candida glabrata NOT DETECTED NOT DETECTED   Candida krusei NOT DETECTED NOT DETECTED   Candida parapsilosis NOT DETECTED NOT DETECTED   Candida tropicalis NOT DETECTED NOT DETECTED    Name of physician (or Provider) Contacted: Dr. Ashok Cordia  Changes to prescribed antibiotics required: none, to continue on Zosyn and vancomycin for now. Hopeful to deescalate abx soon.     Vincenza Hews, PharmD, BCPS Nov 21, 2016, 10:40 AM Pager: [phone]

## 2016-11-27 NOTE — Progress Notes (Signed)
STROKE TEAM PROGRESS NOTE   SUBJECTIVE (INTERVAL HISTORY) No family is at the bedside. As per nurse, pt this morning seems more extension posturing at all extremities with pain stimulation or spontaneously. On exam, he did extended on pain with BUEs and RLE but still withdraw on the LLE for pain. However, will need stat MRI brain for evaluation. Still low grade fever, tolerated weaning for 4 hours yesterday.   OBJECTIVE Temp:  [99.7 F (37.6 C)-101.6 F (38.7 C)] 100.3 F (37.9 C) (12/10 0800) Pulse Rate:  [71-97] 80 (12/10 0845) Cardiac Rhythm: Normal sinus rhythm (12/10 0400) Resp:  [16-31] 22 (12/10 0845) BP: (129-198)/(64-96) 163/80 (12/10 0845) SpO2:  [95 %-98 %] 97 % (12/10 0845) FiO2 (%):  [30 %] 30 % (12/10 0800) Weight:  [199 lb 15.3 oz (90.7 kg)] 199 lb 15.3 oz (90.7 kg) (12/10 0500)  CBC:   Recent Labs Lab 11/04/16 0313 2016-11-17 0358  WBC 19.3* 15.5*  HGB 12.4* 12.0*  HCT 36.1* 35.1*  MCV 90.3 89.1  PLT 289 [id number]    Basic Metabolic Panel:   Recent Labs Lab 11/04/16 0313 11-17-2016 0358  NA 137 135  K 4.6 4.2  CL 101 98*  CO2 29 28  GLUCOSE 179* 307*  BUN 9 6  CREATININE 0.54* 0.54*  CALCIUM 9.2 8.7*    Lipid Panel:     Component Value Date/Time   CHOL 172 10/22/2016 1425   TRIG 157 (H) 10/22/2016 1425   HDL 51 10/22/2016 1425   CHOLHDL 3.4 10/22/2016 1425   VLDL 31 10/22/2016 1425   LDLCALC 90 10/22/2016 1425   HgbA1c:  Lab Results  Component Value Date   HGBA1C 8.5 (H) 10/22/2016   Urine Drug Screen:     Component Value Date/Time   LABOPIA NONE DETECTED 10/20/2016 1437   COCAINSCRNUR NONE DETECTED 10/20/2016 1437   LABBENZ NONE DETECTED 10/20/2016 1437   AMPHETMU NONE DETECTED 10/20/2016 1437   THCU NONE DETECTED 10/20/2016 1437   LABBARB NONE DETECTED 10/20/2016 1437      IMAGING I have personally reviewed the radiological images below and agree with the radiology interpretations.  MRI 10/20/16 1. Area of diffusion restriction and  hyperintense T2 weighted signal within the right frontal white matter. The distribution is not typical of ischemia and the appearance is concerning for a neoplastic process, such as a low grade glioma. Tumefactive demyelinating disease is also a consideration, though if the patient's symptoms are truly acute, some degree of contrast enhancement would be expected. Short-term follow-up with brain MRI with and without contrast in 4-6 weeks is recommended, as change over time may be helpful in diagnosis. Additionally, CSF sampling should be considered. 2. No hemorrhage or mass effect.  MRI 10/22/16 1. Emergent Anterior Circulation Large Vessel Occlusions - both Internal Carotid Arteries likely affected. Progressive bilateral anterior circulation infarcts, newly involving the left MCA/ACA territories. 2. No associated hemorrhage or mass effect at this time. 3. This was discussed by telephone with Dr. Elpidio Anis On 10/22/2016 at 1037 hours. We are arranging for the patient to undergo stat CTA head and neck at the time of this dictation.  CT Head Wo Contrast 10/25/2016 Low-density in the known infarctions affecting the right frontal deep white matter in the left frontal cortical and subcortical brain. No sign of hemorrhagic transformation or significant mass effect/shift. The seems to be a little more involvement in the left basal ganglia by today's CT.  CTA Head and Neck 10/22/2016 1. Functional occlusion of both ICA siphons  with suboptimal reconstitution of flow at both ICA termini: Radiographic string sign stenosis at the right ICA origin in the neck related to bulky low-density plaque or thrombus.  The cervical Right ICA demonstrates thread-like enhancement and appears to occlude at the skullbase.  Suspected much of the Right ICA siphon is fully occluded. Tapered appearance of the cervical Left ICA without focal stenosis in the neck. There is thread-like enhancement in the Left ICA siphon,  but suspected the supraclinoid Left ICA segment is fully occluded.  Thus the tapering in the neck is likely do to distal outflow obstruction. Reconstituted flow from the posterior communicating arteries, which unfortunately are diminutive. No ACA or MCA occlusion identified, but the anterior circulation appears diminutive throughout. 2. Preliminary report of the above was discussed with Dr. Elpidio Anis at 1138 hours. 3. Posterior circulation is largely normal. There does appear to be a moderate or severe atherosclerotic stenosis at the right vertebral artery origin, but no other posterior circulation stenosis. 4. Expected CT appearance of evolving bilateral anterior circulation infarcts. No associated hemorrhage or mass effect. 5. No other abnormality in the neck. Mild emphysema in the lung apices.  Cerebral Angiogram 10/23/2016 Angiographically occluded internal carotid arteries in the cavernous segments at the level of the ophthalmic arteries bilaterally, without reconstitution from the external carotid artery branches. Pre occlusive string sign of the right internal carotid artery proximally extending to the occlusion in the cavernous segment as described above. Approximally 70% stenoses of the left internal carotid artery at the bulb again with a diminutive left internal carotid artery distal to this to its occluded cavernous segment. Approximately 85-90% stenoses at the origin of the right vertebral artery. Retrograde opacification of the anterior cerebral artery distribution bilaterally from the P3 leptomeningeal branches, and from the thalamic perforators from the P2 segment of the posterior cerebral arteries. Occluded pericallosal and callosal marginal branches of the anterior cerebral arteries at the level of the head of the corpus callosum.  CT Head Wo Contrast 10/28/2016 Expected evolution of left ACA and right MCA territory infarcts without evidence of hemorrhagic transformation, new  infarct or other new acute intracranial abnormality.  EEG   Diffuse slow activity, max over left temporal regions some improvement over the course of the recording.   EEG 10/31/16 This EEG is abnormal due to markedly diffuse background slowing. Clinical Correlation of the above findings indicates severe diffuse cerebral dysfunction that is non-specific in etiology and can be seen with hypoxic/ischemic injury, toxic/metabolic encephalopathies, neurodegenerative disorders, or medication effect.  The absence of epileptiform discharges does not rule out a clinical diagnosis of epilepsy.  Clinical correlation is advised.  Dg Chest Port 1 View 11/02/2016 Endotracheal tube and nasogastric tube well position.  Slight worsening of lower lobe atelectasis.  Mr Brain Limited Wo Contrast 10/30/2016 IMPRESSION: Subacute moderate-size right frontal lobe subcortical infarct. Overall size relatively similar prior exam. Compared with 10/22/2016 MR, progressive enlargement of large left frontal lobe infarct now involving portions of the left caudate head and lenticular nucleus. Small area of extension to the left sub insular region. Local mass effect. Gradient and T1 imaging was not performed however, findings are suspicious for the presence of small amount of hemorrhage with these infarcts. Occluded internal carotid arteries with reconstitution of supraclinoid aspect which appears suboptimal given the infarcts. Slow flow within middle cerebral artery branches bilaterally. Prominent opacification/ mucosal thickening paranasal sinuses most notable right maxillary sinus and ethmoid sinus air cells bilaterally.  MRI brain pending     PHYSICAL  EXAM  Temp:  [99.7 F (37.6 C)-101.6 F (38.7 C)] 100.3 F (37.9 C) (12/10 0800) Pulse Rate:  [71-97] 80 (12/10 0845) Resp:  [16-31] 22 (12/10 0845) BP: (129-198)/(64-96) 163/80 (12/10 0845) SpO2:  [95 %-98 %] 97 % (12/10 0845) FiO2 (%):  [30 %] 30 % (12/10 0800) Weight:   [199 lb 15.3 oz (90.7 kg)] 199 lb 15.3 oz (90.7 kg) (12/10 0500)  General - Well nourished, well developed, intubated and no sedation.   Ophthalmologic - Fundi not visualized due to upward gaze and not able to position the ophthoscope.  Cardiovascular - Regular rhythm and rate  Neuro - intubated and on vent, not on sedation. Open eyes on pain stimulation, however, not tracking and not blinking to visual threat. PERRL, upward gaze with disconjugation of eyes, sluggish doll's eye present, positive corneal and gag. On pain stimulation, BUE extension posturing, increase muscle tone bilaterally, LLE mild withdraw and RLE also extended with increased muscle tone. DTR diminished and no babinski. Sensation, coordination and gait not tested.    ASSESSMENT/PLAN Scott Harris is a 51 y.o. male with history of  newly diagnosed bilateral strokes, DM2, ETOH abuse HLD and HTN  presenting with confusion, L sided numbness and tingling x 2 weeks, neuro consulted for stroke and possible seizure.   Stroke:  Progressive bilateral anterior circulation infarcts in setting of bilateral ICA occlusion - Has uncontrolled DM II, HTN, alcohol and smoking R anterior circulation subacute infarct  1-2 weeks prior to PTA New progressive L MCA and ACA infarcts   Resultant  R sided weakness, AMS, respiratory failure  MRI head 10/21/15 - right MCA infarct   MRI head 10/22/16 - Progressive b/l anterior circulation infarcts, right MCA extension and L MCA, ACA and MCA/ACA infarct.  Repeat MRI 10/30/16 - progressive extension of right MCA, ACA infarcts - could be the cause of worsening mental status vs. Transient hypotension post intubation  Repeat stat MRI pending  CTA head and neck - right ICA occlusion at origin and left ICA occlusion at siphon with bilateral distal reconstitution from PCOM.  Cerebral angio - right ICA string sign proximally, b/l ICA occluded at cavernous portion, right VA 85% stenosis  2D Echo - EF 65  - 70%. No cardiac source of emboli identified.  LDL 90  HgbA1c 8.5  Lovenox for VTE prophylaxis Diet NPO time specified Except for: Sips with Meds  No antithrombotic prior to admission, now on aspirin 325 mg daily. plavix on hold for potential incoming procedures  On provigil now for lethagy  Ongoing aggressive stroke risk factor management  Therapy recommendations: Pending  Disposition:  pending (lives w/ wife, works as a Patent examiner)  Family not sure about further plan, they would like to see if he is able to tolerate weaning. If able to be off vent after trach, they are leaning towards trach. However, with decerebrate posturing, likely poor prognosis.  Bilateral severe carotid disease  Could be associated with long term uncontrolled DM, HTN and smoking  Cerebra angio as above  Has plan for right VA stent in the future if pt has reasonable recovery  BP goal 140-160, no need to treat if BP < 180.   Avoid hypotension  Fever   Leukocytosis 14.7-11.6-14.8-17.8-11.8-19.3-15.5  BCx pending - 1/4 growing staph but coagulase negative - likely contamination  Sputum culture pending  UA 10/28/16 negative  CXR 11/02/2016 - Slight worsening of lower lobe atelectasis.  CSF no sign of CNS infection.  Currently off antibiotics -  CCM following - cultures no growth  T Max 100.7 Saturday  ? Seizure   Off valproic acid due to high ammonia.  On keppra 500mg  bid  EEG diffuse slowing   Repeat EEG diffuse slowing and no seizure  Hyperammonemia  Ammonia level  [phone] -41-40  Currently off lactulose  Ammonia level tomorrow again  Hypertension       Permissive hypertension (OK if < 220/120)   Long-term BP goal 140-160  Avoid hypotension  BP still fluctuating due to sedation  Hyperlipidemia  Home meds:  lipitor 10  Lipitor increased to 80 in hospital  LDL 90, goal < 70  Continue statin at discharge  Diabetes  HgbA1c 8.5  CBG still high at  250s-300s  SSI  Change from lantus to NPH Q6h  Alcohol abuse  Heavy drinker per wife  Recent fall on 11/9 after birthday party  [place] again last week  Off CIWA protocol   Dysphagia  Secondary to stroke  On TF @ 70cc/h  Plan for PEG along with trach if decided  Tobacco abuse  Current smoker  Smoking cessation counseling will be provided  Other Stroke Risk Factors  Overweight, Body mass index is 30.4 kg/m., recommend weight loss, diet and exercise as appropriate  Obstructive sleep apnea, inconsistent use of CPAP at home.   Other Problems    Hospital day # 15  This patient is critically ill due to bilateral stroke, bilateral carotid occlusion, right VA stenosis, fever, seizure, hyperammonemia and at significant risk of neurological worsening, death form recurrent stroke, hemorrhagic transformation, status epilepticus, sepsis, shock and brain death. This patient's care requires constant monitoring of vital signs, hemodynamics, respiratory and cardiac monitoring, review of multiple databases, neurological assessment, discussion with family, other specialists and medical decision making of high complexity. I spent 40 minutes of neurocritical care time in the care of this patient.  Rosalin Hawking, MD PhD Stroke Neurology 11/19/16 9:06 AM

## 2016-11-27 NOTE — Progress Notes (Signed)
Pt deceased at 63. Upon ascultation no heart beat or breath sounds noted.Trish Mage RN verified with this nurse. 149ml of fentanyl and 67ml of versed wasted in sink. Lianne Bushy RN BSN

## 2016-11-27 NOTE — Discharge Summary (Signed)
DEATH NOTE: For complete accounting of the patient's history and physical exam on presentation please refer to the H&P dictated on 09/29/2016. Patient was a 51 year old Caucasian male with known history of diabetes mellitus type 2, hypertension, and hyperlipidemia. He presented with acute mental status changes and slurred speech that have progressed over approximately 2 weeks prior to presentation. Patient was found to have a large right frontal white matter lesion without mass effect and started on dexamethasone and Keppra. He had progressive worsening in his clinical status prior to presenting to the emergency department on 10/25/2016. Patient was ultimately found to have bilateral frontal strokes and angiogram of the neck revealed severe bilateral carotid artery disease. Patient was intubated on 12/4 with progressively worsening neurological status and inability to protect his airway. Empiric antibiotics were started for possible aspiration pneumonia. Despite withholding sedation patient's neurologic status did not recover and on 12/10 and MRI revealed acute inferior right anterior cerebral artery territory infarct new compared with his previous MRI from 12/4. The patient also had watershed infarct in the deep frontal white matter that did appear to be improving. There is no evidence of hemorrhage. Patient did have cerebral edema on the left with a 6 mm midline shift to the right. Patient's family were subsequently updated by neurology and the palliative medicine team was consulted. On 12/10 palliative medicine met with the patient's wife Estill Bamberg and in the setting of little hope for a meaningful recovery the decision was made to withdraw life-sustaining measures. Patient underwent terminal vent wean and extubation. Fentanyl and Versed were used for palliation of symptoms. Review of documentation shows the patient passed at 7:30 PM on 14-Nov-2016 with no heartbeat or breath sounds on auscultation as verified by bedside  nursing staff.  DIAGNOSES AT DEATH: 1. Acute Hypoxic Respiratory Failure 2. Acute Encephalopathy 3. Increased Intracranial Pressure with Midline Shift to the Right 4. Bilateral Acute Strokes 5. History of Hyperlipidemia 6. History of Diabetes Mellitus Type 2 7. History of Hypertension 8. History of Alcohol Use

## 2016-11-27 NOTE — Progress Notes (Signed)
VASCULAR LAB PRELIMINARY  PRELIMINARY  PRELIMINARY  PRELIMINARY  Bilateral upper extremity venous duplex completed.    Preliminary report:  There is no DVT or SVT noted in the bilateral upper extremities.   Judiann Celia, RVT 12-02-16, 12:17 PM

## 2016-11-27 NOTE — Progress Notes (Signed)
PULMONARY / CRITICAL CARE MEDICINE   Name: Scott Harris MRN: [id number] DOB: July 21, 1966    ADMISSION DATE:  10/26/2016 CONSULTATION DATE:  10/30/16  REFERRING MD:  Claris Gower  CHIEF COMPLAINT:  Acute respiratory failure, altered MS  HISTORY OF PRESENT ILLNESS:   51 yo man, hx EtOH abuse, HTN, DM, hyperlipidemia, admitted 11/25 with slurred speech, confusion, lethargy. 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 CTA neck and then IR angiogram > severe B carotid disease. It was hoped he would be candidate for CIR after PEG placement for dysphagia. He has experienced progressive altered MS and lethargy since 12/2. A repeat Head CT 12/2 did not show new bleed or edema. No evidence seizure activity noted. Ammonia was slightly elevated and empiric lactulose given.   SUBJECTIVE: Patient febrile again yesterday and recultured. Broad-spectrum antibiotics restarted. Patient continued to be hyperglycemic overnight. Sliding scale adjusted & Lantus increased from 10-15 units. Posturing this morning.  REVIEW OF SYSTEMS:  Unable to obtain with intubation and altered mentation.  VITAL SIGNS: BP (!) 170/82   Pulse 84   Temp (!) 100.5 F (38.1 C) (Rectal)   Resp (!) 23   Ht 5\' 8"  (1.727 m)   Wt 199 lb 15.3 oz (90.7 kg)   SpO2 97%   BMI 30.40 kg/m   HEMODYNAMICS:    VENTILATOR SETTINGS: Vent Mode: PRVC FiO2 (%):  [30 %] 30 % Set Rate:  [16 bmp] 16 bmp Vt Set:  [570 mL] 570 mL PEEP:  [5 cmH20] 5 cmH20 Plateau Pressure:  [13 cmH20-18 cmH20] 13 cmH20  INTAKE / OUTPUT: I/O last 3 completed shifts: In: 4931.6 [I.V.:2041.6; NG/GT:2540; IV Piggyback:350] Out: 3950 [Urine:3950]  PHYSICAL EXAMINATION: General: No distress. No family at bedside. Neuro: No purposeful movement. Roving eye movements. Pupils symmetric. HEENT:  ETT in place. No scleral icterus or injection. Cardiovascular:  Regular rate. No edema. No JVD. Pulmonary:  Symmetric chest rise on ventilator. Clear on auscultation. Abdomen:   Soft. Normal bowel sounds. Nondistended. Integument:  Warm & dry. No rash on exposed skin.   LABS:  BMET  Recent Labs Lab 11/03/16 0258 11/04/16 0313 11/23/16 0358  NA 140 137 135  K 4.2 4.6 4.2  CL 101 101 98*  CO2 29 29 28   BUN 8 9 6   CREATININE 0.70 0.54* 0.54*  GLUCOSE 149* 179* 307*    Electrolytes  Recent Labs Lab 11/03/16 0258 11/04/16 0313 11-23-2016 0358  CALCIUM 8.9 9.2 8.7*    CBC  Recent Labs Lab 11/02/16 0315 11/04/16 0313 11/23/16 0358  WBC 11.8* 19.3* 15.5*  HGB 11.4* 12.4* 12.0*  HCT 34.6* 36.1* 35.1*  PLT 237 289 298    Coag's  Recent Labs Lab 11/01/16 0210  INR 1.05    Sepsis Markers  Recent Labs Lab 11/01/16 0210 11/04/16 1418 11/04/16 2352 November 23, 2016 0358 11/23/16 0654  LATICACIDVEN  --   --  1.0  --  0.9  PROCALCITON <0.10 <0.10  --  0.14  --     ABG  Recent Labs Lab 10/30/16 0915 10/31/16 0416 11/01/16 0500  PHART 7.319* 7.420 7.380  PCO2ART 54.7* 42.6 48.5*  PO2ART 171* 110* 134*    Liver Enzymes  Recent Labs Lab 10/30/16 0606  AST 27  ALT 35  ALKPHOS 71  BILITOT 0.8  ALBUMIN 3.0*    Cardiac Enzymes No results for input(s): TROPONINI, PROBNP in the last 168 hours.  Glucose  Recent Labs Lab 11/04/16 1200 11/04/16 1915 11/04/16 2312 November 23, 2016 0109 23-Nov-2016 0442 November 23, 2016 0731  GLUCAP  210* 250* 311* 282* 304* 308*    Imaging Dg Chest Port 1 View  Result Date: 2016-11-26 CLINICAL DATA:  Initial evaluation for acute respiratory failure. EXAM: PORTABLE CHEST 1 VIEW COMPARISON:  Prior radiograph from 11/02/2016. FINDINGS: Patient remains intubated with the tip of an endotracheal tube positioned approximately 3.8 cm above the carina. Enteric tube courses in the the abdomen. Cardiomegaly is stable. Mediastinal silhouette within normal limits. Lungs are hypoinflated. Mild patchy bibasilar atelectasis. No pulmonary edema. No pleural effusion. No pneumothorax. Azygos lobe noted. Osseous structures  unchanged. IMPRESSION: 1. Endotracheal tube and nasogastric tube well positioned. 2. Shallow lung inflation with slightly worsened bibasilar atelectasis. Electronically Signed   By: Jeannine Boga M.D.   On: Nov 26, 2016 06:23   Dg Abd Portable 1v  Result Date: 11/04/2016 CLINICAL DATA:  Intubation, feeding tube, 2 episodes of emesis today, possible ileus, history hypertension, diabetes mellitus, stroke EXAM: PORTABLE ABDOMEN - 1 VIEW COMPARISON:  Portable exam 1405 hours compared to 10/24/2016 FINDINGS: Surgical clips RIGHT upper quadrant consistent with history of cholecystectomy. Tip of nasogastric tube projects over stomach, proximal side-port at or just above the gastroesophageal junction. Nonobstructive bowel gas pattern. No bowel dilatation or bowel wall thickening. Osseous structures unremarkable. IMPRESSION: Normal bowel gas pattern. Recommend advancing nasogastric tube 5 cm to definitively place proximal side-port within the gastric lumen. Findings called to [facility] on [facility] on 11/04/2016 at 1424 hours. Electronically Signed   By: Lavonia Dana M.D.   On: 11/04/2016 14:24     STUDIES:  EEG 11/28 & 11/29: no seizure activity or focus. Diffuse slow activity Cerebral angiogram 11/27: B ICA stenosis, 100% R, 99% on L  MICROBIOLOGY: Blood Ctx x2 12/2:  Negative  Blood Ctx x2 12/3:  1/2 Coag Neg Staph MRSA PCR 12/4:  Negative  Tracheal Asp Ctx 12/4:  Normal oral flora CSF Bacterial Ctx 12/5:  Negative  CSF Fungal Ctx 12/5 >> Blood Ctx x2 12/9 >> Tracheal Asp Ctx 12/9 >>  ANTIBIOTICS: Ceftaz 12/5 - 12/7 Vancomycin 12/5 - 12/7; 12/9 >> Zosyn 12/4 - 12/5; 12/9>>  SIGNIFICANT EVENTS: 12/02 - Admit 12/05 - Biting ETT & febrile>>LP done  LINES/TUBES: OETT 7.5 12/4 >>  R NGT >> Foley 12/8 >> PIV  ASSESSMENT / PLAN:  PULMONARY A: Acute Hypoxic Respiratory Failure - Unable to protect airway. Possible Aspiration Pneumonia H/O Tobacco Use Disorder  P:   Full Vent  Support Daily SBT & PS weaning Intermittent ABG & Port CXR Consider Trach versus one-way extubation next week  NEUROLOGIC A:   Bilateral Large Frontal Strokes - MRI 12/4 w/ progressive enlargement of large left frontal lobe infarct.  Acute Encephalopathy - Likely multifactorial with strokes but also a component of hepatic. Chronic EtOH Use - Risk for withdrawal. Sedation on Ventilator  P:   RASS goal: 0 to -1 Neurology Managing AEDs per Neuro:  Keppra BID Thiamine & Folic Acid VT daily Provigil BID Lactulose TID VT Fentanyl IV prn Pain Versed IV prn Sedation MRI today  CARDIOVASCULAR A:  HTN Hyperlipidemia  P:  BP Control per Neurology Vitals per unit protocol Continuous telemetry monitoring Continuing ASA 325mg  & Lipitor 80mg  daily  RENAL A:   No acute issues.  P:   Trending UOP Monitoring electrolytes daily Replacing electrolytes as indicated  GASTROINTESTINAL A:   Chronic EtOH Use Diarrhea - Minimal.  P:   NPO Protonix VT daily Continuing Tube Feedings daily  HEMATOLOGIC A:    Leukocytosis - Improving.  P:  Trending cell counts  daily w/ CBC SCDs Lovenox [place] q24hr Checking Venous Duplex of extremities given FUO  INFECTIOUS A:   Possible Sepsis FUO - Since 12/4. Unlikely infection with negative procalcitonin. Possible Aspiration Pneumonia  P:   Continuing Empiric Vancomycin & Zosyn Day #2 Low Threshold to discontinue antibiotics Awaiting Culture Results Trending Procalcitonin per Algorithm   ENDOCRINE A:   H/O DM - Glucose uncontrolled. Required 80u of Novolog in last 24hr with Lantus 10u.  P:   D/C Lantus Starting NPH 20u [place] q6hr SSI per Moderate Algorithm Accu-Checks q4hr  FAMILY  - Updates: No family at bedside 12/10.  - Inter-disciplinary family meet or Palliative Care meeting due by:  11/06/16  TODAY'S SUMMARY:  51 y.o. man with HTN, DM, EtOH abuse experience d progressive lethargy and resp failure in setting B frontal  CVA's. Course has been consistent with progressive poor airway management. No improvement in neurological status. Plan for MRI today. Increasing dose of subcutaneous insulin for hyperglycemia control. Continuing broad-spectrum antibiotics for now with low threshold to de-escalate/discontinue medications. Plan to readdress goals of care and tracheostomy/PEG tube placement on 12/11.  I have spent a total of 34 minutes of critical care time today caring for the patient and reviewing the patient's electronic medical record.  Sonia Baller Ashok Cordia, M.D. [facility] Pager:  [phone] After 3pm or if no response, call [phone] November 11, 2016 8:05 AM

## 2016-11-27 NOTE — Progress Notes (Signed)
[facility] Progress Note Patient Name: Melaki Argumedo DOB: 07-04-1966 MRN: [id number]   Date of Service  11-10-16  HPI/Events of Note  Hypertension with SBP greater than 170.  Currently 188/106 (127).    eICU Interventions  Labetalol 10 mg IV q2 hours PRN hypertension     Intervention Category Intermediate Interventions: Hypertension - evaluation and management  Iliya Spivack 11/10/16, 12:38 AM

## 2016-11-27 DEATH — deceased

## 2017-07-11 IMAGING — CT CT HEAD W/O CM
4 series · 18 of 47 positions shown, 20 images · non-contrast
Comparison: Most recent prior head CT 10/25/2016

CLINICAL DATA: 50-year-old male with acute stroke now status post
intervention.

EXAM:
CT HEAD WITHOUT CONTRAST
TECHNIQUE: Contiguous axial images were obtained from the base of the skull
through the vertex without intravenous contrast.

[Series 201: head w/o, idose (1) · axial · non-contrast · 0.46mm/px · z∈[+573,+693]mm · 8 of 32 slices shown, 10 images]
[im 4/32  brain]
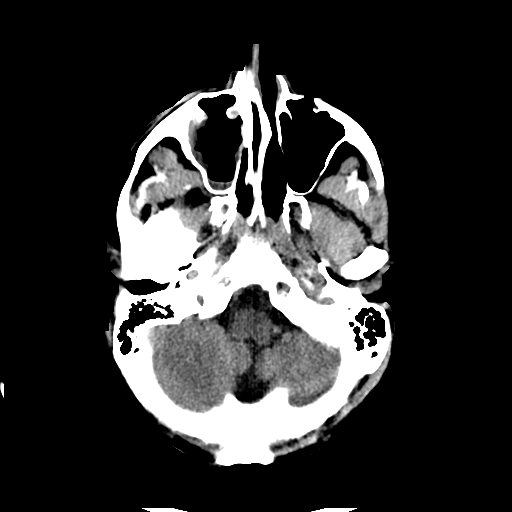
[im 4/32  bone]
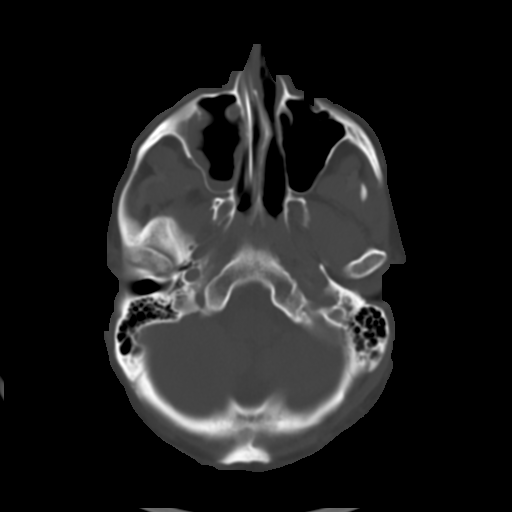
[im 7/32  brain]
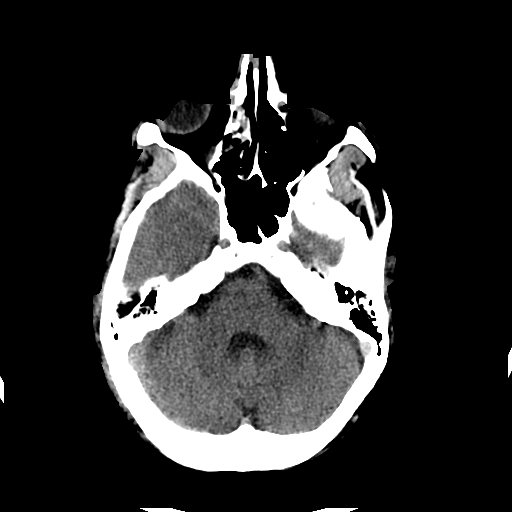
[im 11/32  brain]
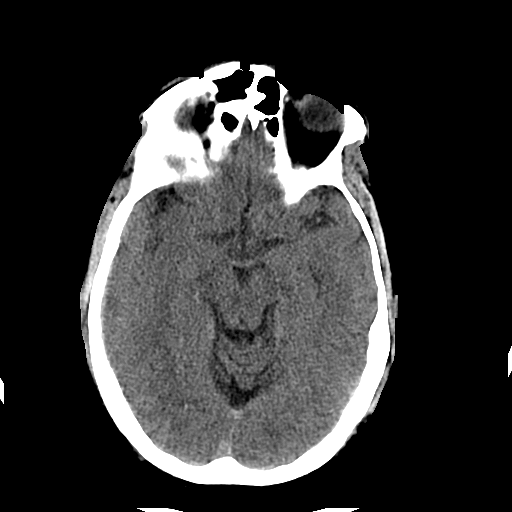
[im 14/32  brain]
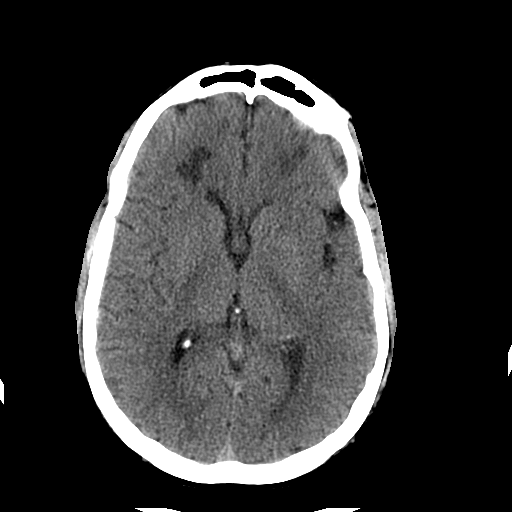
[im 18/32  brain]
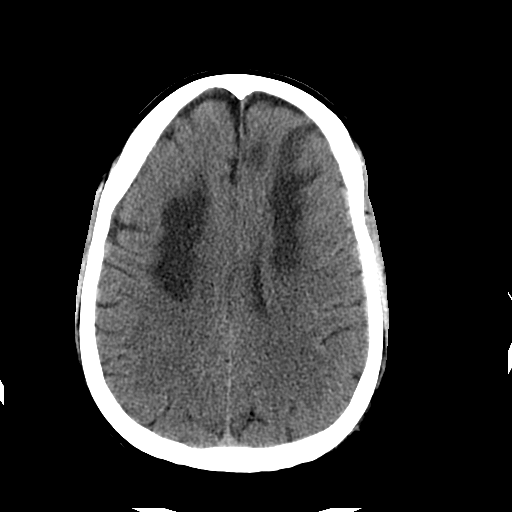
[im 18/32  bone]
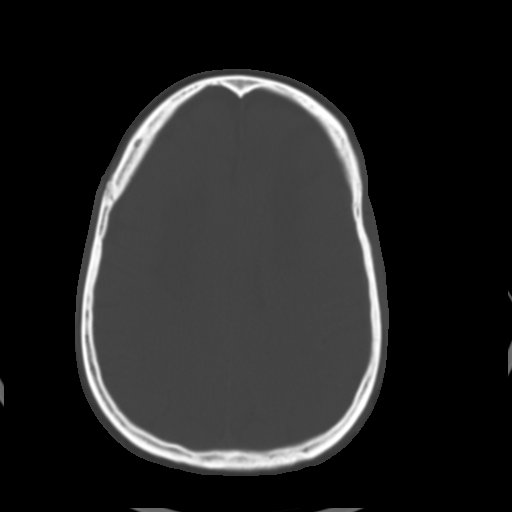
[im 21/32  brain]
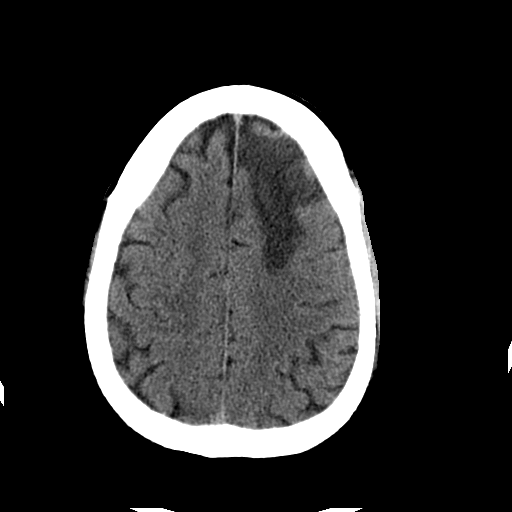
[im 25/32  brain]
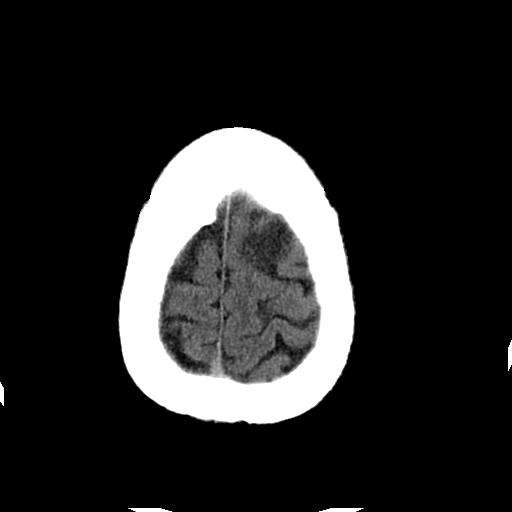
[im 28/32  brain]
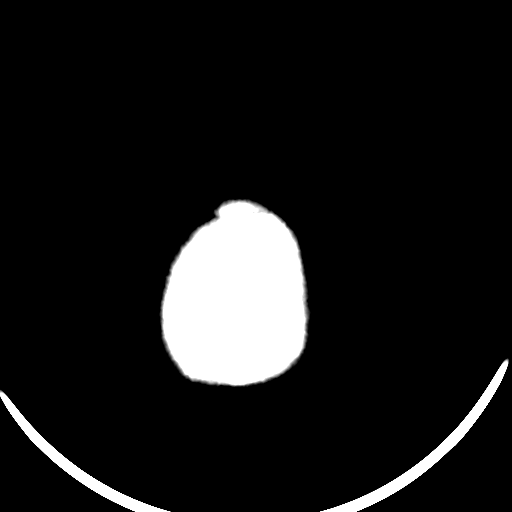

[Series 202: head w/o bone, idose (1) · axial · non-contrast · 0.46mm/px · z∈[+571,+621]mm · 4 of 64 slices shown]
[im 7/64  bone]
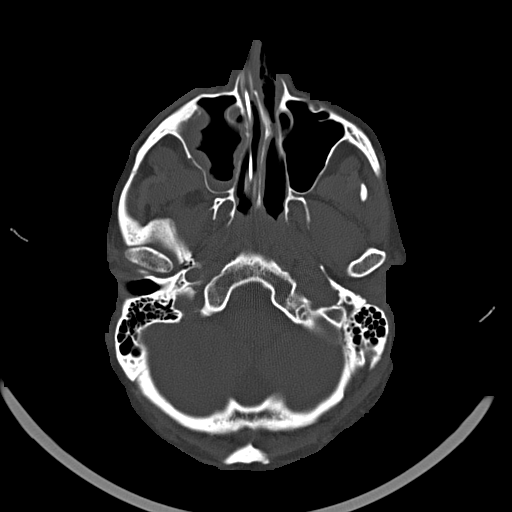
[im 14/64  bone]
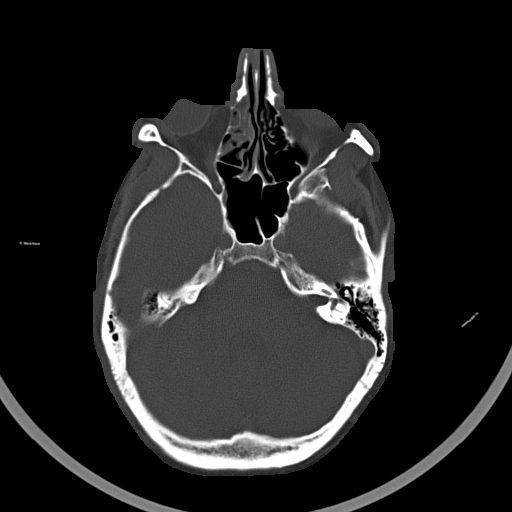
[im 20/64  bone]
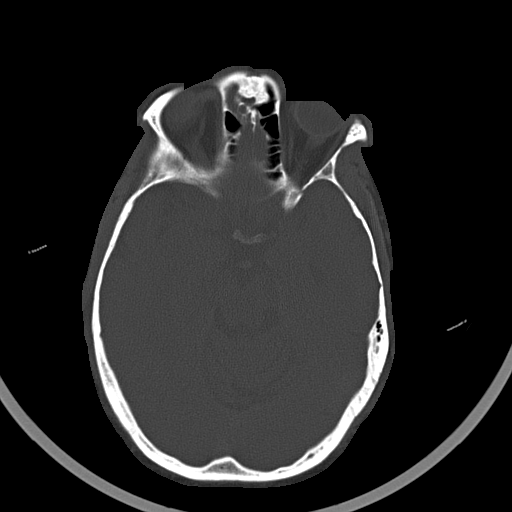
[im 27/64  bone]
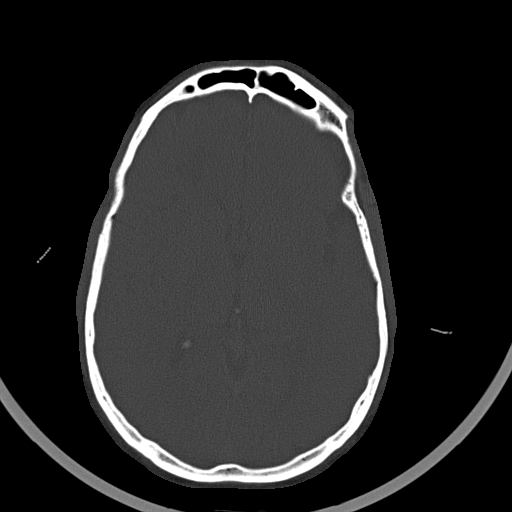

[Series 203: coronal st, idose (1) · coronal · 0.40mm/px · 3 of 77 slices shown]
[im 26/77  brain]
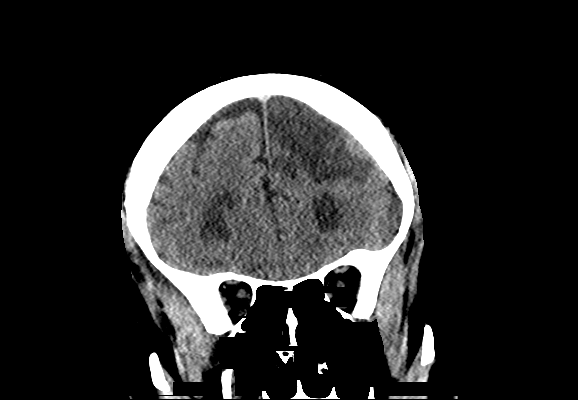
[im 34/77  brain]
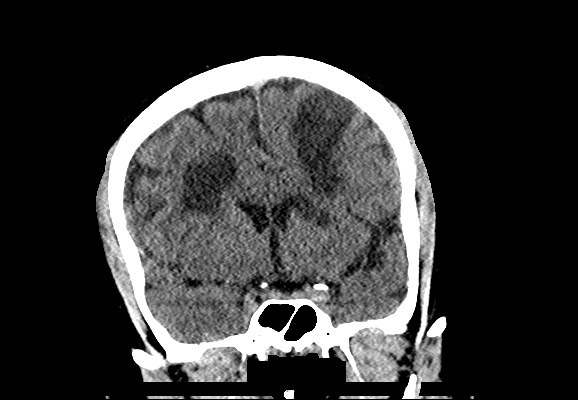
[im 43/77  brain]
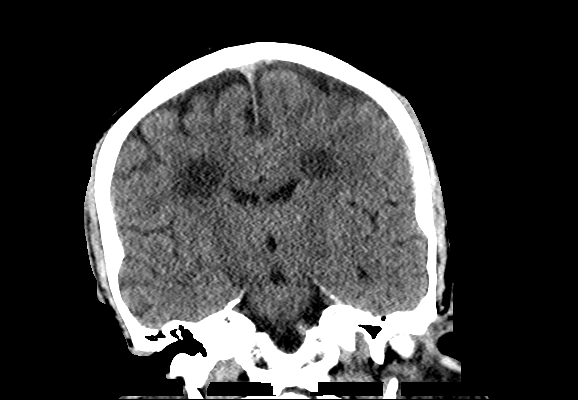

[Series 204: sagittal st, idose (1) · sagittal · 0.40mm/px · 3 of 74 slices shown]
[im 25/74  brain]
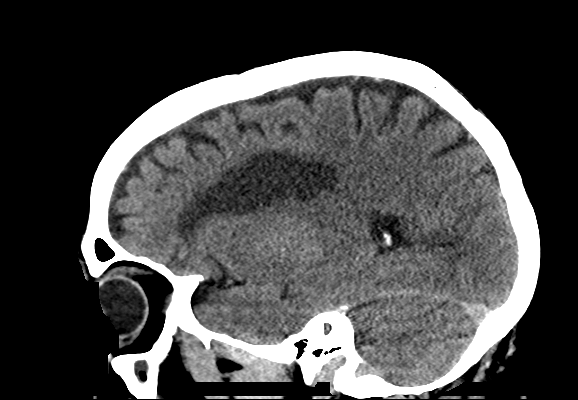
[im 37/74  brain]
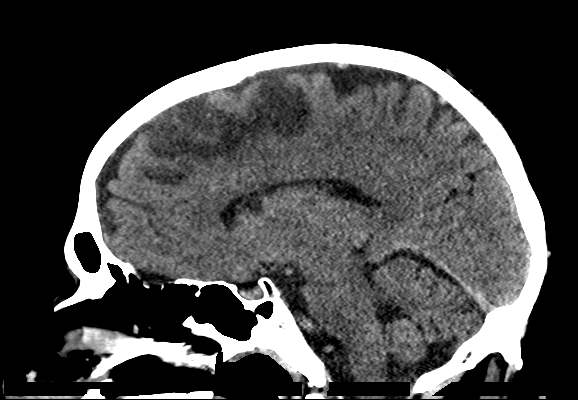
[im 49/74  brain]
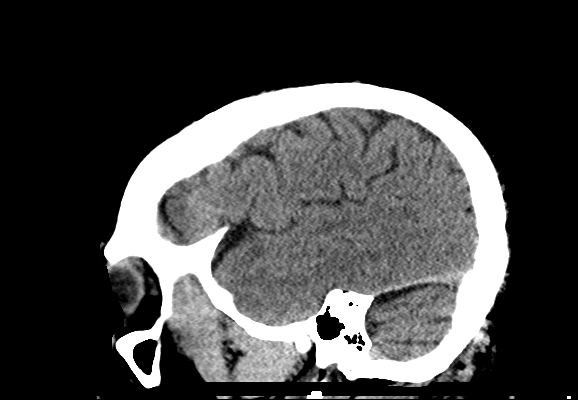

[18 of 47 positions shown; findings below may reference images not displayed]

FINDINGS: Brain: Expected evolution of regions of acute infarction in the left
ACA and right MCA territories. No evidence of hemorrhagic
transformation, new acute infarct, mass lesion or mass effect. No
new cerebral edema or midline shift.

Vascular: No hyperdense vessel or unexpected calcification.

Skull: Normal. Negative for fracture or focal lesion.

Sinuses/Orbits: Stable chronic mucoperiosteal thickening throughout
the right maxillary antrum. Partial opacification of right anterior
ethmoid air cells extending into the right frontal sinus. The
mastoid air cells are clear. Right-sided nasogastric tube noted
incidentally.

Other: None.
IMPRESSION: 1. Expected evolution of left ACA and right MCA territory infarcts
without evidence of hemorrhagic transformation, new infarct or other
new acute intracranial abnormality.

## 2017-07-13 IMAGING — CR DG CHEST 1V
1 series · 1 of 1 positions shown · non-contrast
Comparison: 10/30/2016.

CLINICAL DATA: Intubation.

EXAM:
CHEST 1 VIEW

[AP]
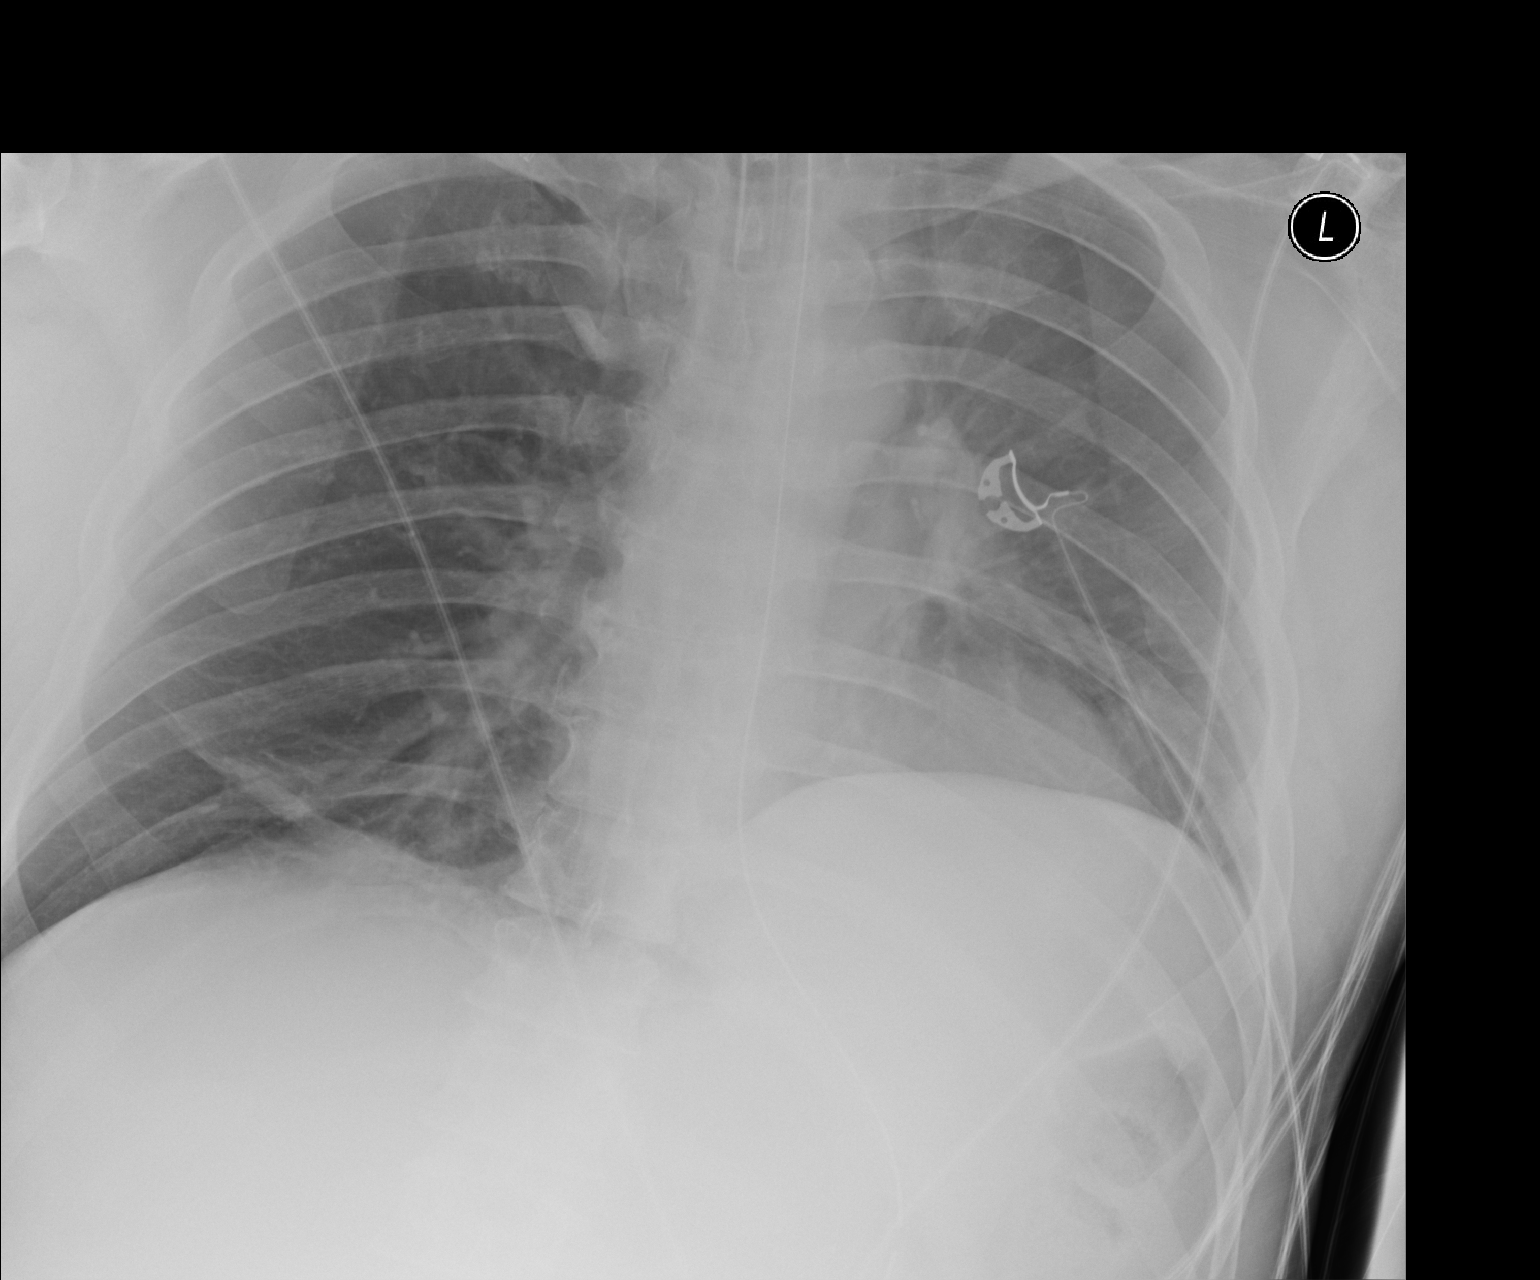

[1 of 1 positions shown; findings below may reference images not displayed]

FINDINGS: Endotracheal tube noted with its tip 3.9 cm above the carina. NG
tube in stable position. Heart size stable. Right base subsegmental
atelectasis. Elevation left hemidiaphragm noted. No pleural effusion
or pneumothorax.
IMPRESSION: 1. Endotracheal tube noted with tip 3.9 cm above the carina. NG tube
noted with tip below left hemidiaphragm.

2. Right base subsegmental atelectasis. Elevation left
hemidiaphragm.

## 2017-07-14 IMAGING — CR DG CHEST 1V PORT
1 series · 1 of 1 positions shown · non-contrast
Comparison: 10/30/2016 and earlier.

CLINICAL DATA: 50-year-old male status post bilateral anterior
circulation thrombosis and cerebral infarcts. Intubated. Initial
encounter.

EXAM:
PORTABLE CHEST 1 VIEW

[ap]
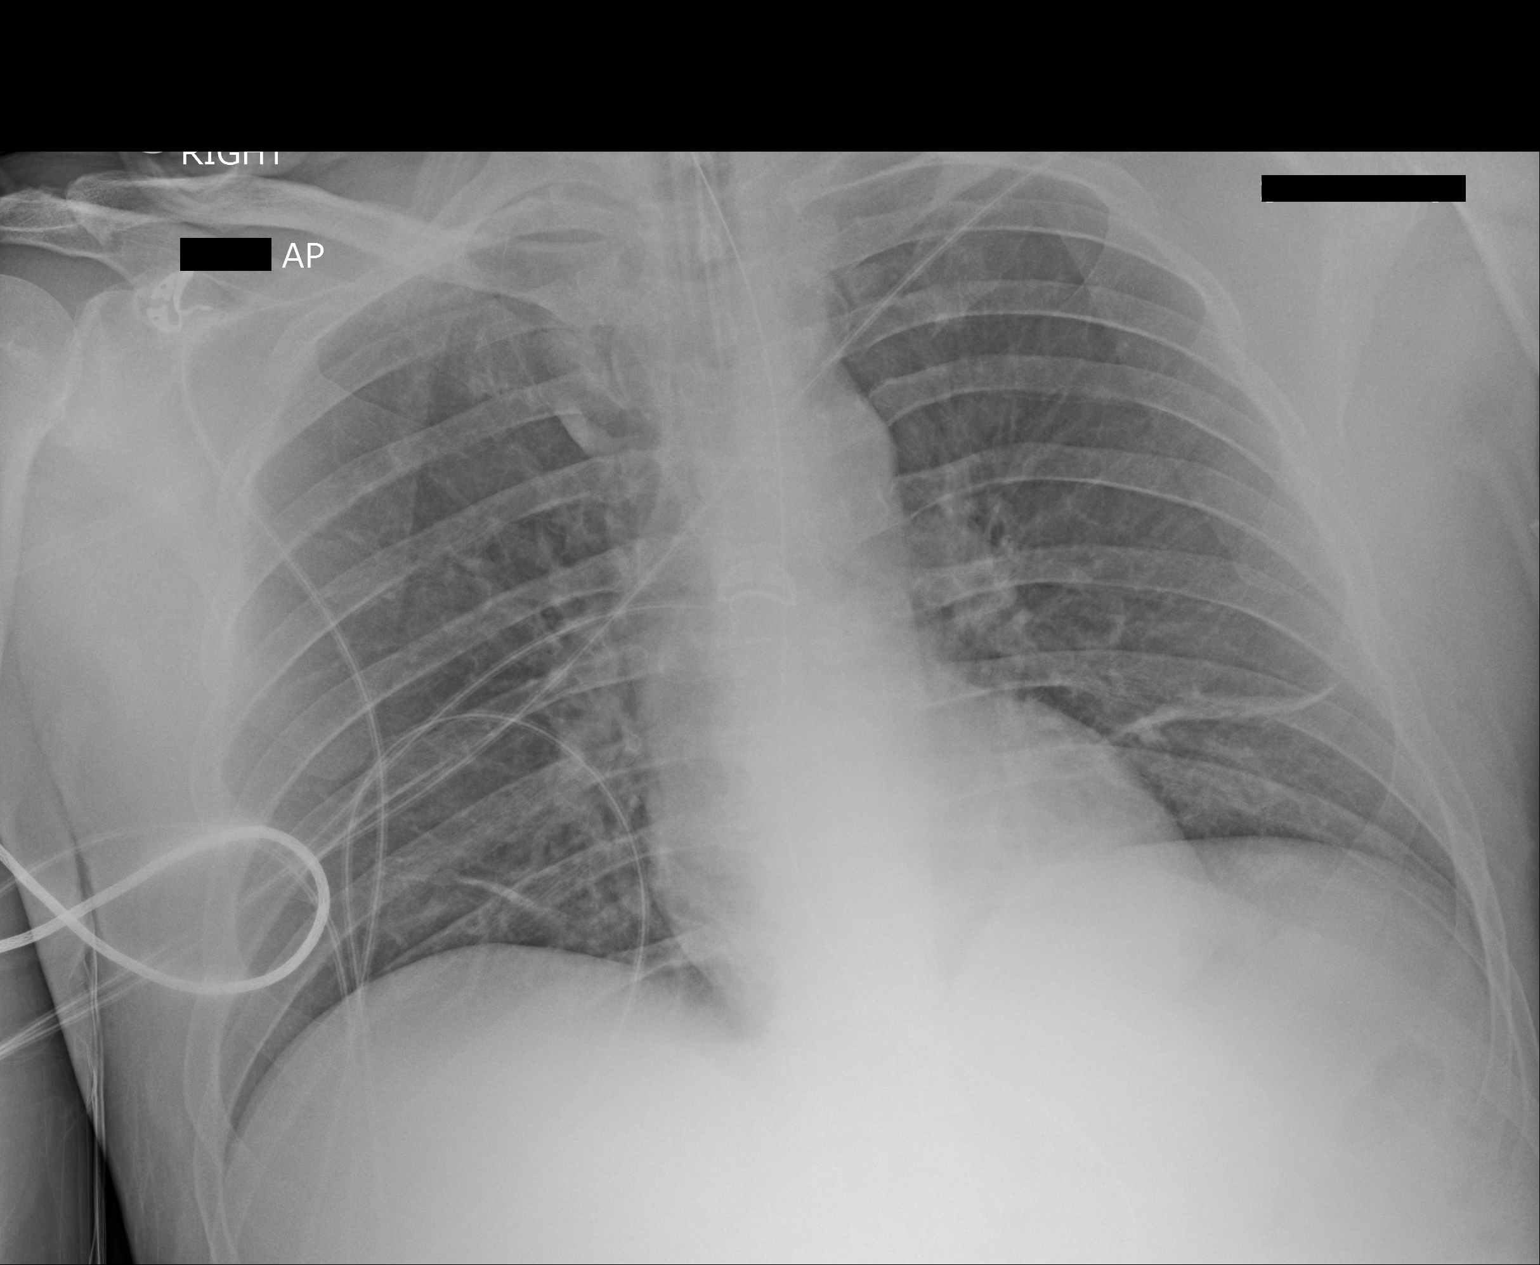

[1 of 1 positions shown; findings below may reference images not displayed]

FINDINGS: Portable AP semi upright view at at 1384 hours. Stable position of
the endotracheal tube. Enteric tube courses to the abdomen, tip not
included. Improved left lung base ventilation and decreased
elevation of the left hemidiaphragm. Mild platelike atelectasis at
both lung bases, also regressed on the right. Incidental azygos
fissure. No pneumothorax, pulmonary edema or pleural effusion.
Normal cardiac size and mediastinal contours.
IMPRESSION: 1.  Stable lines and tubes.
2. Improved ventilation since yesterday. Mild residual bibasilar
atelectasis.

## 2017-07-18 IMAGING — CR DG ABD PORTABLE 1V
2 series · 2 of 2 positions shown · non-contrast
Comparison: Portable exam 4537 hours compared to 10/24/2016

CLINICAL DATA: Intubation, feeding tube, 2 episodes of emesis
today, possible ileus, history hypertension, diabetes mellitus,
stroke

EXAM:
PORTABLE ABDOMEN - 1 VIEW

[AP (1 of 2)]
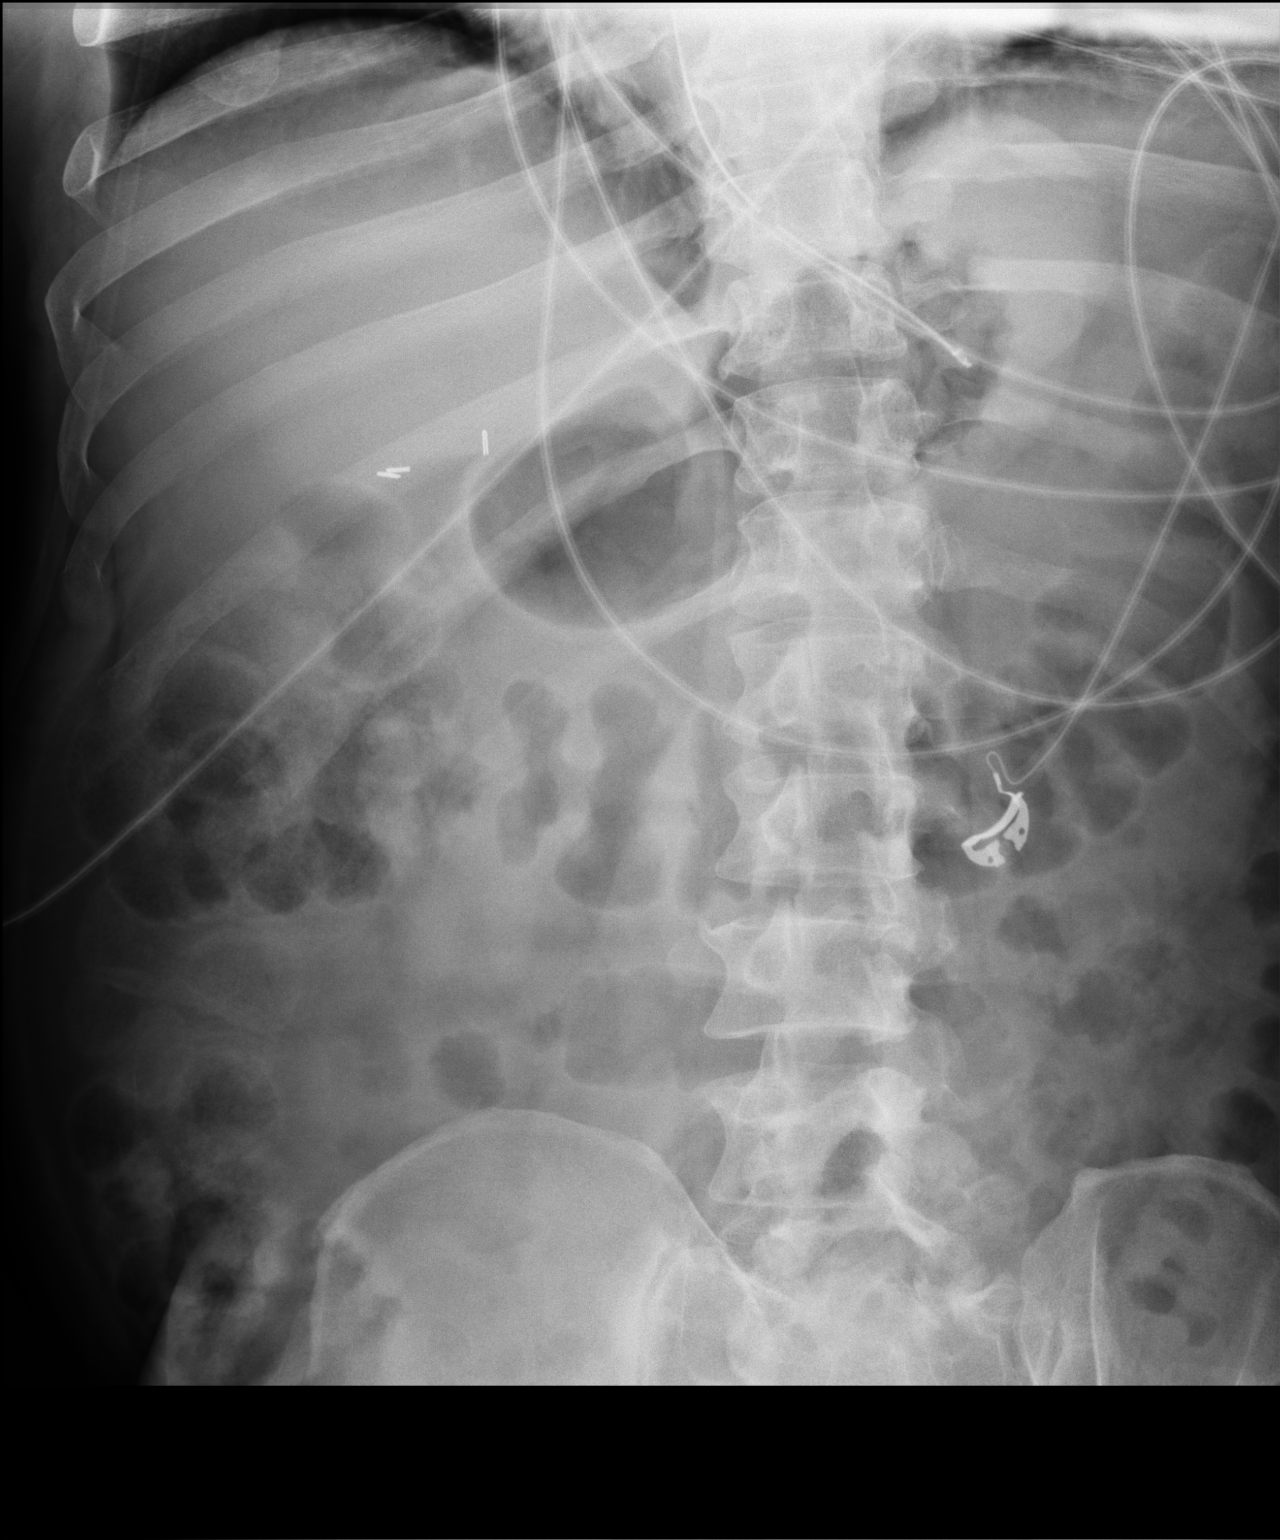

[AP (2 of 2)]
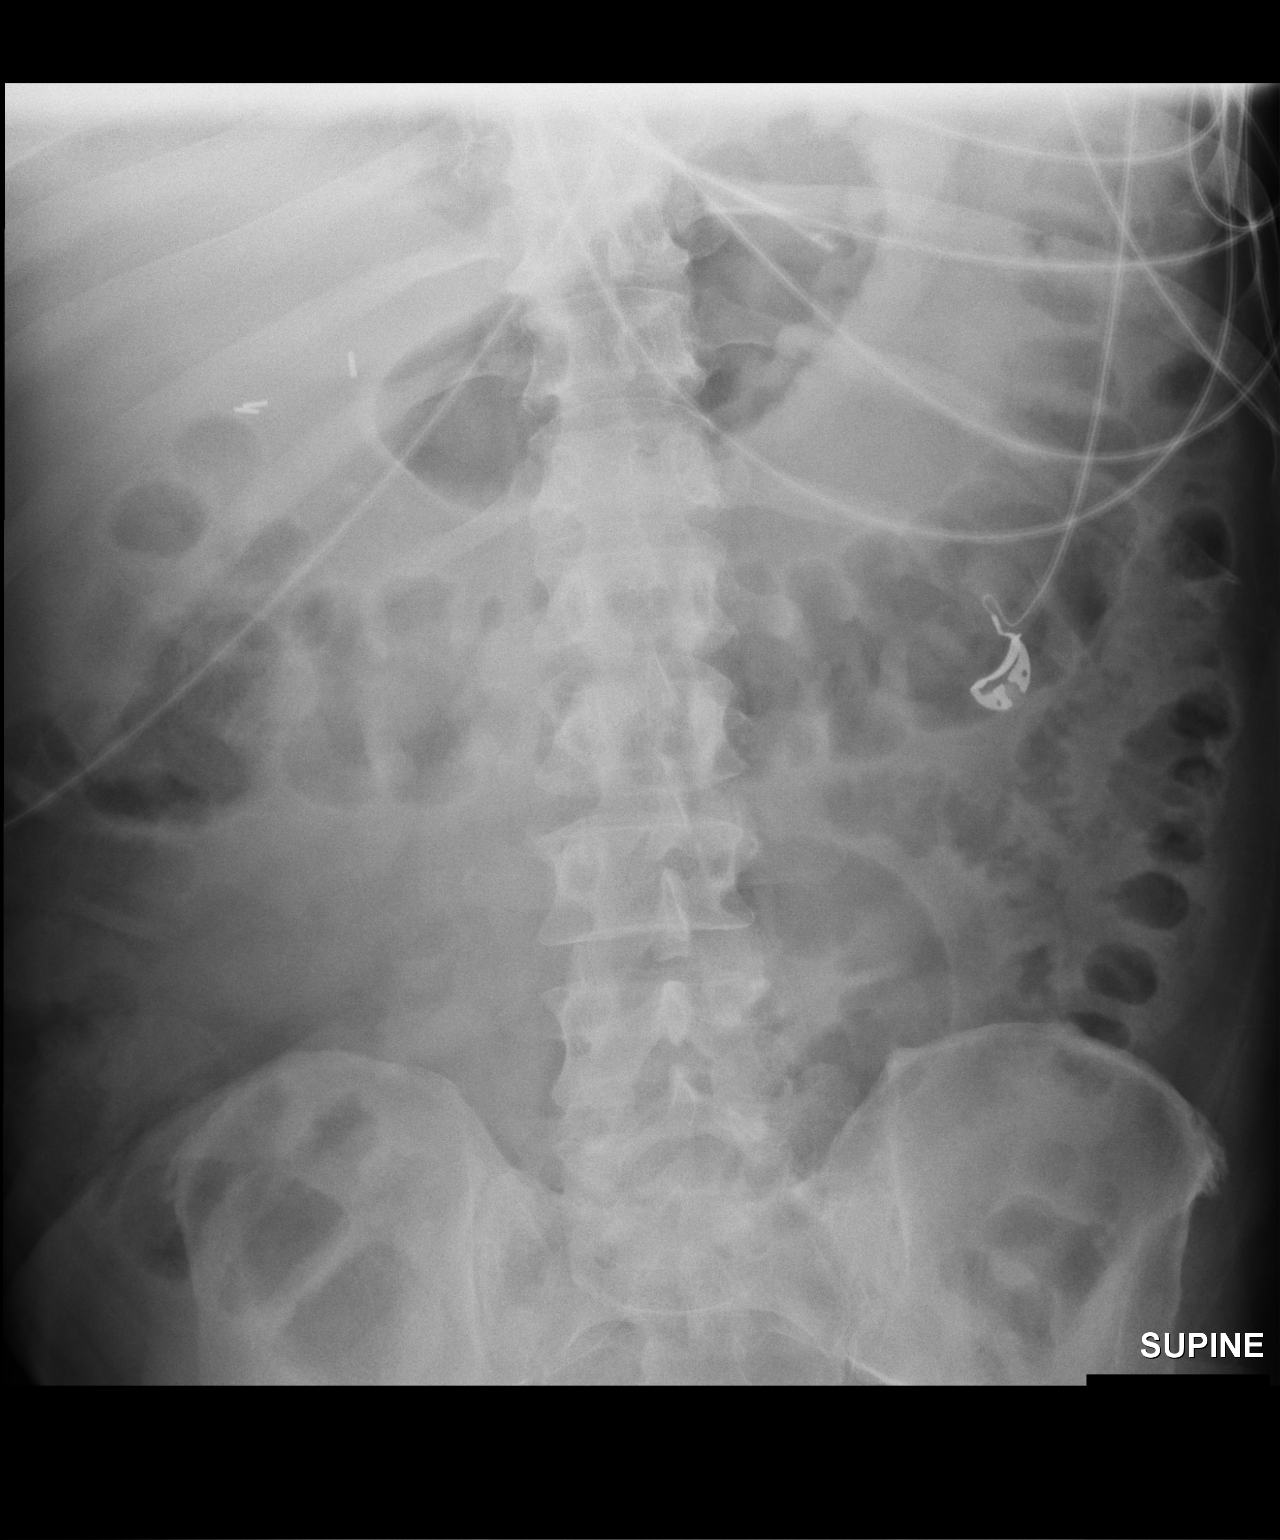

[2 of 2 positions shown; findings below may reference images not displayed]

FINDINGS: Surgical clips RIGHT upper quadrant consistent with history of
cholecystectomy.

Tip of nasogastric tube projects over stomach, proximal side-port at
or just above the gastroesophageal junction.

Nonobstructive bowel gas pattern.

No bowel dilatation or bowel wall thickening.

Osseous structures unremarkable.
IMPRESSION: Normal bowel gas pattern.

Recommend advancing nasogastric tube 5 cm to definitively place
proximal side-port within the gastric lumen.

Findings called to Lam[place] RN on [facility] on 11/04/2016 at 1636 hours.
